# Patient Record
Sex: Female | Born: 1960 | Race: White | Hispanic: No | Smoking: Never smoker
Health system: Southern US, Community
[De-identification: ages and names within clinical notes are randomized; demographics above are authoritative.]

## PROBLEM LIST (undated history)

## (undated) DIAGNOSIS — J45909 Unspecified asthma, uncomplicated: Secondary | ICD-10-CM

## (undated) DIAGNOSIS — M5430 Sciatica, unspecified side: Secondary | ICD-10-CM

## (undated) DIAGNOSIS — I10 Essential (primary) hypertension: Secondary | ICD-10-CM

## (undated) DIAGNOSIS — T8859XA Other complications of anesthesia, initial encounter: Secondary | ICD-10-CM

## (undated) DIAGNOSIS — R202 Paresthesia of skin: Secondary | ICD-10-CM

## (undated) DIAGNOSIS — G5600 Carpal tunnel syndrome, unspecified upper limb: Secondary | ICD-10-CM

## (undated) DIAGNOSIS — M779 Enthesopathy, unspecified: Secondary | ICD-10-CM

## (undated) DIAGNOSIS — K589 Irritable bowel syndrome without diarrhea: Secondary | ICD-10-CM

## (undated) DIAGNOSIS — E119 Type 2 diabetes mellitus without complications: Secondary | ICD-10-CM

## (undated) HISTORY — DX: Enthesopathy, unspecified: M77.9

## (undated) HISTORY — DX: Type 2 diabetes mellitus without complications: E11.9

## (undated) HISTORY — PX: CARPAL TUNNEL RELEASE: SHX101

## (undated) HISTORY — DX: Unspecified asthma, uncomplicated: J45.909

## (undated) HISTORY — DX: Paresthesia of skin: R20.2

## (undated) HISTORY — DX: Irritable bowel syndrome, unspecified: K58.9

## (undated) HISTORY — DX: Sciatica, unspecified side: M54.30

## (undated) HISTORY — PX: ABDOMINAL HYSTERECTOMY: SHX81

---

## 2006-04-16 ENCOUNTER — Ambulatory Visit (HOSPITAL_COMMUNITY): Admission: RE | Admit: 2006-04-16 | Discharge: 2006-04-16 | Payer: Self-pay | Admitting: Family Medicine

## 2009-05-15 ENCOUNTER — Ambulatory Visit (HOSPITAL_COMMUNITY): Admission: RE | Admit: 2009-05-15 | Discharge: 2009-05-15 | Payer: Self-pay | Admitting: Internal Medicine

## 2009-05-15 ENCOUNTER — Encounter (INDEPENDENT_AMBULATORY_CARE_PROVIDER_SITE_OTHER): Payer: Self-pay | Admitting: Internal Medicine

## 2009-09-17 ENCOUNTER — Ambulatory Visit (HOSPITAL_COMMUNITY): Admission: RE | Admit: 2009-09-17 | Discharge: 2009-09-17 | Payer: Self-pay | Admitting: Family Medicine

## 2010-05-09 ENCOUNTER — Emergency Department (HOSPITAL_COMMUNITY)
Admission: EM | Admit: 2010-05-09 | Discharge: 2010-05-09 | Disposition: A | Discharge status: Home / Self Care | Payer: Medicaid Other | Attending: Emergency Medicine | Admitting: Emergency Medicine

## 2010-05-09 ENCOUNTER — Emergency Department (HOSPITAL_COMMUNITY): Payer: Medicaid Other

## 2010-05-09 DIAGNOSIS — E119 Type 2 diabetes mellitus without complications: Secondary | ICD-10-CM | POA: Insufficient documentation

## 2010-05-09 DIAGNOSIS — S8990XA Unspecified injury of unspecified lower leg, initial encounter: Secondary | ICD-10-CM | POA: Insufficient documentation

## 2010-05-09 DIAGNOSIS — M25569 Pain in unspecified knee: Secondary | ICD-10-CM | POA: Insufficient documentation

## 2010-05-09 DIAGNOSIS — Y929 Unspecified place or not applicable: Secondary | ICD-10-CM | POA: Insufficient documentation

## 2010-05-09 DIAGNOSIS — X500XXA Overexertion from strenuous movement or load, initial encounter: Secondary | ICD-10-CM | POA: Insufficient documentation

## 2010-06-30 ENCOUNTER — Telehealth: Payer: Self-pay

## 2010-06-30 DIAGNOSIS — Z139 Encounter for screening, unspecified: Secondary | ICD-10-CM

## 2010-06-30 NOTE — Telephone Encounter (Signed)
Gastroenterology Pre-Procedure Form  Request Date: 07/18/2010  Requesting Physician: Ocie Bob Diego     PATIENT INFORMATION:  Megan Salazar is a 50 y.o., female (DOB=12-Apr-1960).  PROCEDURE: Procedure(s) requested: colonoscopy Procedure Reason: screening for colon cancer  PATIENT REVIEW QUESTIONS: The patient reports the following:   1. Diabetes Melitis: yes  (Diet controlled) 2. Joint replacements in the past 12 months: no 3. Major health problems in the past 3 months: no 4. Has an artificial valve or MVP:no 5. Has been advised in past to take antibiotics in advance of a procedure like teeth cleaning: no}    MEDICATIONS & ALLERGIES:    Patient reports the following regarding taking any blood thinners:   Plavix? no Aspirin?no Coumadin?  no  Patient confirms/reports the following medications:  Current Outpatient Prescriptions  Medication Sig Dispense Refill  . fluticasone (FLOVENT HFA) 110 MCG/ACT inhaler Inhale 1 puff into the lungs 2 (two) times daily. As needed       . montelukast (SINGULAIR) 10 MG tablet Take 10 mg by mouth at bedtime. As needed         Patient confirms/reports the following allergies:  Allergies  Allergen Reactions  . Codeine     Liquid codeine  . Sulfa Drugs Cross Reactors     Patient is appropriate to schedule for requested procedure(s): yes  AUTHORIZATION INFORMATION Primary Insurance: Medicaid  ID # [id number] Q  Q: Group #:   Pre-Cert / Auth required:  Secondary Insurance:   ID #: ,  Group #:  Pre-Cert / Auth required: n/a Pre-Cert / Auth #:  Orders Placed This Encounter  Procedures  . Endoscopy, colon, diagnostic    Standing Status: Future     Number of Occurrences:      Standing Expiration Date: 06/30/2011    Order Specific Question:  Pre-op diagnosis    Answer:  screening    Order Specific Question:  Pre-op visit required?    Answer:  No [0]    SCHEDULE INFORMATION: Procedure has been scheduled as follows:  Date:  07/18/2010  Time: 11:15 AM  Location: [facility] Short Stay   This Gastroenterology Pre-Precedure Form is being routed to the following provider(s) for review: Jonette Eva, MD

## 2010-06-30 NOTE — Telephone Encounter (Signed)
HALF-LYTELY

## 2010-07-18 ENCOUNTER — Encounter: Payer: Medicaid Other | Admitting: Gastroenterology

## 2010-07-18 ENCOUNTER — Other Ambulatory Visit: Payer: Self-pay | Admitting: Gastroenterology

## 2010-07-18 ENCOUNTER — Ambulatory Visit (HOSPITAL_COMMUNITY)
Admission: RE | Admit: 2010-07-18 | Discharge: 2010-07-18 | Disposition: A | Discharge status: Home / Self Care | Payer: Medicaid Other | Source: Ambulatory Visit | Attending: Gastroenterology | Admitting: Gastroenterology

## 2010-07-18 DIAGNOSIS — Z1211 Encounter for screening for malignant neoplasm of colon: Secondary | ICD-10-CM | POA: Insufficient documentation

## 2010-07-18 DIAGNOSIS — K5730 Diverticulosis of large intestine without perforation or abscess without bleeding: Secondary | ICD-10-CM

## 2010-07-18 DIAGNOSIS — D126 Benign neoplasm of colon, unspecified: Secondary | ICD-10-CM

## 2010-07-18 HISTORY — PX: COLONOSCOPY: SHX174

## 2010-07-21 NOTE — Progress Notes (Signed)
Cc to PCP 

## 2010-07-22 NOTE — Progress Notes (Signed)
Reminder in epic to have TCS in 10 yrs

## 2010-08-19 NOTE — Op Note (Signed)
  NAME:  Megan Salazar, Megan Salazar NO.:  [id number]  MEDICAL RECORD NO.:  [id number]           PATIENT TYPE:  O  LOCATION:  DAYP                          FACILITY:  [facility]  PHYSICIAN:  Jonette Eva, M.D.     DATE OF BIRTH:  June 08, 1960  DATE OF PROCEDURE:  07/18/2010 DATE OF DISCHARGE:                              OPERATIVE REPORT   REFERRING PHYSICIAN:  Melvyn Novas, MD  PROCEDURE:  Colonoscopy with snare cautery with cold forceps polypectomy.  INDICATION FOR EXAM:  Megan Salazar is a 50 year old female who presents for average risk colon cancer screening.  FINDINGS: 1. A 3-mm sessile ascending colon polyp removed via cold forceps. 2. Scattered diverticula seen in the beginning in the mid transverse     colon and extending to the sigmoid colon.  Otherwise no masses,     inflammatory changes or AVMs seen. 3. Normal retroflexed view of the rectum.  RECOMMENDATIONS: 1. Screening colonoscopy in 10 years.  We will call her with results     of her biopsies. 2. She should follow a high-fiber diet.  She was given handout on high-     fiber diet, polyps, and diverticulosis.  MEDICATIONS:  None.  PROCEDURE TECHNIQUE:  Physical exam was performed.  Informed consent was obtained from the patient explaining benefits, risks, and alternatives to procedure.  The patient was connected to the monitor and placed in the left lateral position.  Continuous oxygen was provided by nasal cannula and IV fluids were administered through indwelling cannula. After rectal exam, the patient's rectum was intubated and the scope was advanced under direct visualization to the cecum.  The scope was removed slowly by carefully examining the color, texture, anatomy, and integrity of the mucosa on the way out.  The patient was recovered in endoscopy and discharged home in satisfactory condition.  PATH: SIMPLE ADENOMA-TCS IN 10 YEARS, HIGH FIBER DIET  Jonette Eva, M.D.    SF/MEDQ  D:   07/18/2010  T:  07/19/2010  Job:  [id number]  Electronically Signed by Jonette Eva M.D. on 08/19/2010 02:24:51 PM

## 2010-12-01 ENCOUNTER — Other Ambulatory Visit (HOSPITAL_COMMUNITY): Payer: Self-pay | Admitting: Family Medicine

## 2010-12-01 DIAGNOSIS — Z139 Encounter for screening, unspecified: Secondary | ICD-10-CM

## 2010-12-05 ENCOUNTER — Ambulatory Visit (HOSPITAL_COMMUNITY)
Admission: RE | Admit: 2010-12-05 | Discharge: 2010-12-05 | Disposition: A | Discharge status: Home / Self Care | Payer: Medicaid Other | Source: Ambulatory Visit | Attending: Family Medicine | Admitting: Family Medicine

## 2010-12-05 DIAGNOSIS — Z139 Encounter for screening, unspecified: Secondary | ICD-10-CM

## 2010-12-05 DIAGNOSIS — Z1231 Encounter for screening mammogram for malignant neoplasm of breast: Secondary | ICD-10-CM | POA: Insufficient documentation

## 2011-08-03 ENCOUNTER — Other Ambulatory Visit (HOSPITAL_COMMUNITY): Payer: Self-pay | Admitting: Family Medicine

## 2011-08-03 DIAGNOSIS — M545 Low back pain: Secondary | ICD-10-CM

## 2011-08-05 ENCOUNTER — Ambulatory Visit (HOSPITAL_COMMUNITY)
Admission: RE | Admit: 2011-08-05 | Discharge: 2011-08-05 | Disposition: A | Discharge status: Home / Self Care | Payer: Medicaid Other | Source: Ambulatory Visit | Attending: Family Medicine | Admitting: Family Medicine

## 2011-08-05 DIAGNOSIS — M545 Low back pain: Secondary | ICD-10-CM

## 2011-08-18 ENCOUNTER — Other Ambulatory Visit: Payer: Self-pay | Admitting: Family Medicine

## 2011-08-18 ENCOUNTER — Ambulatory Visit
Admission: RE | Admit: 2011-08-18 | Discharge: 2011-08-18 | Disposition: A | Discharge status: Home / Self Care | Payer: Medicaid Other | Source: Ambulatory Visit | Attending: Family Medicine | Admitting: Family Medicine

## 2011-08-18 ENCOUNTER — Ambulatory Visit: Admission: RE | Admit: 2011-08-18 | Payer: Medicaid Other | Source: Ambulatory Visit

## 2011-08-18 DIAGNOSIS — M545 Low back pain: Secondary | ICD-10-CM

## 2011-12-30 ENCOUNTER — Encounter: Payer: Self-pay | Admitting: Gastroenterology

## 2011-12-31 ENCOUNTER — Ambulatory Visit (INDEPENDENT_AMBULATORY_CARE_PROVIDER_SITE_OTHER): Payer: Medicaid Other | Admitting: Urgent Care

## 2011-12-31 ENCOUNTER — Encounter: Payer: Self-pay | Admitting: Urgent Care

## 2011-12-31 VITALS — BP 132/68 | HR 84 | Temp 98.4°F | Ht 64.0 in | Wt 270.4 lb

## 2011-12-31 DIAGNOSIS — R197 Diarrhea, unspecified: Secondary | ICD-10-CM

## 2011-12-31 DIAGNOSIS — K529 Noninfective gastroenteritis and colitis, unspecified: Secondary | ICD-10-CM

## 2011-12-31 MED ORDER — ALIGN 4 MG PO CAPS: 4.0000 mg | ORAL_CAPSULE | Freq: Every day | ORAL | Status: DC

## 2011-12-31 MED ORDER — HYDROCORTISONE ACETATE 25 MG RE SUPP: 25.0000 mg | Freq: Two times a day (BID) | RECTAL | Status: DC

## 2011-12-31 MED ORDER — DICYCLOMINE HCL 10 MG PO CAPS: 10.0000 mg | ORAL_CAPSULE | Freq: Three times a day (TID) | ORAL | Status: DC

## 2011-12-31 NOTE — Patient Instructions (Addendum)
Please have your PCP fax lab results to [place] Bentyl 10mg  before each meal & bedtime (four times per day) as needed for diarrhea Align daily Anusol suppositories for hemorrhoids Please return stools & get labs drawn as soon as possible Speak with your doctor about your concerns about lisinopril We will call you with lab results 5-7 days after they are turned in

## 2011-12-31 NOTE — Progress Notes (Signed)
Referring Provider: Isabella Stalling, MD Primary Care Physician:  Isabella Stalling, MD Primary Gastroenterologist:  Dr. Jonette Eva  Chief Complaint  Patient presents with  . Diarrhea    HPI:  Megan Salazar is a 51 y.o. female here as a referral from Dr. Janna Arch for chronic diarrhea.  She gives hx of IBS & "colitis" years ago while in the [employer].  She tells me she was working & under a significant amount of stress.  She says she ways diagnosed with xrays.  She did not have a colonoscopy at that time.  Never treated on any chronic medications for colitis.  She also believes this was the reason she was discharged from the [employer] as she always had to be near a bathroom.  Symptoms have worsened 1 yr ago after she started glimiperide & Lisinopril.  She is having TNTC loose stools daily.  She stopped tumeric as she felt it may have contributed, but did not see a difference.  She is having stools every hour, awakened out of sound sleep at times.  During the day she can go 6-7 hrs straight with bowel movements.  C/o abdominal bloating, gas, &  incontinence.   Hx hemorrhoids & rectal bleeding in small amts. No mucus, no vomiting.  No nausea,  + chills.   No fever.  Z-pack for sinusitis 8 mo ago.  Gives blood several times per yr (last Oct 4) & was told her blood count was normal.  Last Hgb a1c 7.4.  No recent travel, ill contacts, or new pets.  She has also noticed hemorrhoids have been bothering her, scant hematochezia with wiping.     Past Medical History  Diagnosis Date  . Diabetes   . IBS (irritable bowel syndrome)     chronic diarrhea  . Sciatica   . Asthma   . Bone spur   . Paresthesias     left side    Past Surgical History  Procedure Date  . Colonoscopy 07/18/2010    Fields-SIMPLE ADENOMA (Next colonoscopy  06/2020)  . Carpal tunnel release     bilat, [place]  . Cesarean section   . Abdominal hysterectomy     partial    Current Outpatient Prescriptions  Medication Sig  Dispense Refill  . fluticasone (FLOVENT HFA) 110 MCG/ACT inhaler Inhale 1 puff into the lungs 2 (two) times daily. As needed       . glimepiride (AMARYL) 2 MG tablet Take 2 mg by mouth daily before breakfast.      . lisinopril (PRINIVIL,ZESTRIL) 5 MG tablet Take 5 mg by mouth daily.      . montelukast (SINGULAIR) 10 MG tablet Take 10 mg by mouth at bedtime. As needed       . dicyclomine (BENTYL) 10 MG capsule Take 1 capsule (10 mg total) by mouth 4 (four) times daily -  before meals and at bedtime.  90 capsule  2  . hydrocortisone (ANUSOL-HC) 25 MG suppository Place 1 suppository (25 mg total) rectally 2 (two) times daily.  20 suppository  0  . Probiotic Product (ALIGN) 4 MG CAPS Take 4 mg by mouth daily.  31 capsule  5    Allergies as of 12/31/2011 - Review Complete 12/31/2011  Allergen Reaction Noted  . Equal (aspartame)  07/03/2010  . Codeine  06/30/2010  . Sulfa drugs cross reactors  06/30/2010    Family History:There is no known family history of colorectal carcinoma , liver disease, or inflammatory bowel disease.  Problem Relation Age  of Onset  . Brain cancer Brother   . Diverticulitis Sister   . Coronary artery disease Father   . Diabetes Father     History   Social History  . Marital Status: Divorced    Spouse Name: N/A    Number of Children: 1  . Years of Education: N/A   Occupational History  . Disabled, previous [employer] [phone]    Social History Main Topics  . Smoking status: Never Smoker   . Smokeless tobacco: Not on file  . Alcohol Use: No  . Drug Use: No  . Sexually Active: Not on file   Review of Systems: Gen: + fatigue, malaise, problems sleeping CV: Denies chest pain, palpitations Resp: Dyspnea on exertion. Denies dyspnea at rest, cough, sputum, wheezing, coughing up blood, and pleurisy. GI: Denies vomiting blood, jaundice, and fecal incontinence.   Denies dysphagia or odynophagia. GU : Denies urinary burning, blood in urine, urinary frequency, urinary  hesitancy, nocturnal urination, and urinary incontinence. MS: chronic joint pain & myalgias.  Derm: Denies rash, itching, dry skin, hives, moles, warts, or unhealing ulcers.  Psych: Denies depression, anxiety, memory loss, suicidal ideation, hallucinations, paranoia, and confusion. Heme: Denies bruising, bleeding, and enlarged lymph nodes. Neuro:  Denies any headaches, dizziness, paresthesias. Endo:  Wants to control DM with diet.  Really does not want meds.  Physical Exam: BP 132/68  Pulse 84  Temp 98.4 F (36.9 C) (Temporal)  Ht 5\' 4"  (1.626 m)  Wt 270 lb 6.4 oz (122.653 kg)  BMI 46.41 kg/m2 No LMP recorded. Patient is not currently having periods (Reason: Other). General:   Alert,  Well-developed, well-nourished, pleasant and cooperative in NAD Head:  Normocephalic and atraumatic. Eyes:  Sclera clear, no icterus.   Conjunctiva pink. Ears:  Normal auditory acuity. Nose:  No deformity, discharge, or lesions. Mouth:  No deformity or lesions,oropharynx pink & moist. Neck:  Supple; no masses or thyromegaly. Lungs:  Clear throughout to auscultation.   No wheezes, crackles, or rhonchi. No acute distress. Heart:  Regular rate and rhythm; no murmurs, clicks, rubs,  or gallops. Abdomen:  Protuberant.  Normal bowel sounds.  No bruits.  Soft, non-tender and non-distended without masses, hepatosplenomegaly or hernias noted.  No guarding or rebound tenderness.  Exam limited given patient's body habitus. Rectal:  Deferred. Msk:  Symmetrical without gross deformities. Pulses:  Normal pulses noted. Extremities:  No clubbing or edema. Neurologic:  Alert and oriented x4;  grossly normal neurologically. Skin:  Intact without significant lesions or rashes. Lymph Nodes:  No significant cervical adenopathy. Psych:  Alert and cooperative. Normal mood and affect.

## 2012-01-01 NOTE — Assessment & Plan Note (Addendum)
Megan Salazar is a pleasant 51 y.o. female with chronic profuse diarrhea.  Screening colonoscopy last year was normal without any endoscopic evidence of colitis.  She gives hx of "colitis" years ago  & IBS.  Symptoms have worsened over past year.  Differentials include severe IBS, microscopic colitis, diabetic enteropathy, celiac disease or small bowel bacterial overgrowth, & less likely inflammatory bowel disease.      Full set of stool studies, TSH, CBC, TTG IgA Bentyl 10mg  before each meal & bedtime (four times per day) as needed for diarrhea Align daily Anusol suppositories BID for hemorrhoids for 10 days  Speak with your doctor about your concerns about lisinopril

## 2012-01-02 LAB — FECAL LACTOFERRIN, QUANT: Lactoferrin: NEGATIVE

## 2012-01-04 LAB — GIARDIA/CRYPTOSPORIDIUM (EIA): Cryptosporidium Screen (EIA): NEGATIVE

## 2012-01-04 LAB — CLOSTRIDIUM DIFFICILE BY PCR: Toxigenic C. Difficile by PCR: NOT DETECTED

## 2012-01-04 NOTE — Progress Notes (Signed)
Faxed to PCP

## 2012-01-07 LAB — STOOL CULTURE

## 2012-01-07 NOTE — Progress Notes (Signed)
Quick Note:  Did pt get labs drawn?  ______

## 2012-01-11 LAB — CBC WITH DIFFERENTIAL/PLATELET
Basophils Absolute: 0 10*3/uL (ref 0.0–0.1)
Basophils Relative: 0 % (ref 0–1)
Eosinophils Absolute: 0.3 10*3/uL (ref 0.0–0.7)
Eosinophils Relative: 4 % (ref 0–5)
HCT: 40 % (ref 36.0–46.0)
MCHC: 32.8 g/dL (ref 30.0–36.0)
MCV: 90.9 fL (ref 78.0–100.0)
Monocytes Relative: 8 % (ref 3–12)
Neutro Abs: 3.9 10*3/uL (ref 1.7–7.7)
Platelets: 294 10*3/uL (ref 150–400)

## 2012-01-11 NOTE — Progress Notes (Signed)
Quick Note:  Called pt and she said that she is going to go this AM when it warms up. ______

## 2012-01-12 NOTE — Progress Notes (Signed)
Quick Note:  Please let pt know blood ct, thyroid, lab for celiac disease and stools all normal. Suspect Megan Salazar has irritable bowel syndrome. How is Megan Salazar doing on Align, Bentyl, Anusol? [id number] M, MD   ______

## 2012-01-12 NOTE — Progress Notes (Signed)
Quick Note:  Ok, please set up FU OV re: IBS w/ Dr Darrick Penna in 3 months Thanks ______

## 2012-01-12 NOTE — Progress Notes (Signed)
Quick Note:  I called and informed pt. She is doing well. Said she has a BM within 2-3 hours after her meals. Normal stool. She is taking the align daily. Taking Bentyl once a week and it has gotten her on track. She could not afford the Anusol, she used Prep H instead and is doing well. She will call if anything changes. ______

## 2012-02-03 NOTE — Progress Notes (Signed)
REVIEWED.  

## 2012-02-04 ENCOUNTER — Other Ambulatory Visit (HOSPITAL_COMMUNITY): Payer: Self-pay | Admitting: Family Medicine

## 2012-02-04 DIAGNOSIS — Z139 Encounter for screening, unspecified: Secondary | ICD-10-CM

## 2012-02-08 ENCOUNTER — Ambulatory Visit (HOSPITAL_COMMUNITY)
Admission: RE | Admit: 2012-02-08 | Discharge: 2012-02-08 | Disposition: A | Discharge status: Home / Self Care | Payer: Medicaid Other | Source: Ambulatory Visit | Attending: Family Medicine | Admitting: Family Medicine

## 2012-02-08 DIAGNOSIS — Z1231 Encounter for screening mammogram for malignant neoplasm of breast: Secondary | ICD-10-CM | POA: Insufficient documentation

## 2012-02-08 DIAGNOSIS — Z139 Encounter for screening, unspecified: Secondary | ICD-10-CM

## 2012-03-09 ENCOUNTER — Encounter: Payer: Self-pay | Admitting: *Deleted

## 2012-11-29 ENCOUNTER — Emergency Department (HOSPITAL_COMMUNITY): Payer: Medicaid Other

## 2012-11-29 ENCOUNTER — Encounter (HOSPITAL_COMMUNITY): Payer: Self-pay | Admitting: Emergency Medicine

## 2012-11-29 ENCOUNTER — Emergency Department (HOSPITAL_COMMUNITY)
Admission: EM | Admit: 2012-11-29 | Discharge: 2012-11-29 | Disposition: A | Discharge status: Home / Self Care | Payer: Medicaid Other | Attending: Emergency Medicine | Admitting: Emergency Medicine

## 2012-11-29 DIAGNOSIS — W108XXA Fall (on) (from) other stairs and steps, initial encounter: Secondary | ICD-10-CM | POA: Insufficient documentation

## 2012-11-29 DIAGNOSIS — Y9389 Activity, other specified: Secondary | ICD-10-CM | POA: Insufficient documentation

## 2012-11-29 DIAGNOSIS — S8392XA Sprain of unspecified site of left knee, initial encounter: Secondary | ICD-10-CM

## 2012-11-29 DIAGNOSIS — K589 Irritable bowel syndrome without diarrhea: Secondary | ICD-10-CM | POA: Insufficient documentation

## 2012-11-29 DIAGNOSIS — Z79899 Other long term (current) drug therapy: Secondary | ICD-10-CM | POA: Insufficient documentation

## 2012-11-29 DIAGNOSIS — IMO0002 Reserved for concepts with insufficient information to code with codable children: Secondary | ICD-10-CM | POA: Insufficient documentation

## 2012-11-29 DIAGNOSIS — E119 Type 2 diabetes mellitus without complications: Secondary | ICD-10-CM | POA: Insufficient documentation

## 2012-11-29 DIAGNOSIS — Y929 Unspecified place or not applicable: Secondary | ICD-10-CM | POA: Insufficient documentation

## 2012-11-29 DIAGNOSIS — J45909 Unspecified asthma, uncomplicated: Secondary | ICD-10-CM | POA: Insufficient documentation

## 2012-11-29 MED ORDER — IBUPROFEN 800 MG PO TABS: 800.0000 mg | ORAL_TABLET | Freq: Three times a day (TID) | ORAL | Status: DC

## 2012-11-29 NOTE — ED Notes (Signed)
Patient transported to X-ray 

## 2012-11-29 NOTE — ED Notes (Signed)
Per [facility] pt was walking off front porch and slipped with her knee bending underneath her. She is experiencing pain and tingling from left knee down to left ankle.

## 2012-11-29 NOTE — ED Provider Notes (Signed)
CSN: [id number]     Arrival date & time 11/29/12  0710 History  This chart was scribed for Megan Lennert, MD by Caryn Bee, ED Scribe. This patient was seen in room APA03/AP[facility]03 and the patient's care was started 7:27 AM.    Chief Complaint  Patient presents with  . Knee Pain   Patient is a 52 y.o. female presenting with knee pain. The history is provided by the patient. No language interpreter was used.  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   Radiates to:  Does not radiate   Severity:  Moderate   Onset quality:  Sudden   Timing:  Constant   Progression:  Unchanged Chronicity:  New Dislocation: no   Foreign body present:  No foreign bodies Prior injury to area:  Yes (Left knee fracture, 2006) Relieved by: flexion. Worsened by:  Extension and bearing weight Ineffective treatments:  None tried Associated symptoms: swelling   Associated symptoms: no back pain and no fatigue    HPI Comments: Megan Salazar is a 52 y.o. female brought in by [facility] to the Emergency Department complaining of constant, moderate left knee pain that onset suddenly this morning when she slipped on a wet surface, and fell off of her porch this morning. Pt states that she felt her knee bend into an uncomfortable position after the fall. Pt describes the pain as "soreness" and "tightness". Pt reports that she was able to walk after the fall, but with exacerbation of her pain. She states that extending and moving the knee worsens the pain. 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 She also reports a left knee fracture in 2006. She denies head injury, LOC or any other symptoms.  PCP- Dr. Oval Linsey  Past Medical  History  Diagnosis Date  . Diabetes   . IBS (irritable bowel syndrome)     chronic diarrhea  . Sciatica   . Asthma   . Bone spur   . Paresthesias     left side   Past Surgical History  Procedure Laterality Date  . Colonoscopy  07/18/2010    Fields-SIMPLE ADENOMA (Next colonoscopy  06/2020)  . Carpal tunnel release      bilat, [place]  . Cesarean section    . Abdominal hysterectomy      partial   Family History  Problem Relation Age of Onset  . Brain cancer Brother   . Diverticulitis Sister   . Coronary artery disease Father   . Diabetes Father    History  Substance Use Topics  . Smoking status: Never Smoker   . Smokeless tobacco: Not on file  . Alcohol Use: No   OB History   Grav Para Term Preterm Abortions TAB SAB Ect Mult Living                 Review of Systems  Constitutional: Negative for appetite change and fatigue.  HENT: Negative for congestion, sinus pressure and ear discharge.   Eyes: Negative for discharge.  Respiratory: Negative for cough.   Cardiovascular: Negative for chest pain.  Gastrointestinal: Negative for abdominal pain and diarrhea.  Genitourinary: Negative for frequency and  hematuria.  Musculoskeletal: Positive for arthralgias (Left knee) and gait problem. Negative for back pain.  Skin: Negative for rash.  Neurological: Negative for seizures, syncope and headaches.  Psychiatric/Behavioral: Negative for hallucinations.  All other systems reviewed and are negative.    Allergies  Equal; Codeine; and Sulfa drugs cross reactors  Home Medications   Current Outpatient Rx  Name  Route  Sig  Dispense  Refill  . dicyclomine (BENTYL) 10 MG capsule   Oral   Take 1 capsule (10 mg total) by mouth 4 (four) times daily -  before meals and at bedtime.   90 capsule   2   . fluticasone (FLOVENT HFA) 110 MCG/ACT inhaler   Inhalation   Inhale 1 puff into the lungs 2 (two) times daily. As needed          . glimepiride (AMARYL) 2 MG tablet    Oral   Take 2 mg by mouth daily before breakfast.         . hydrocortisone (ANUSOL-HC) 25 MG suppository   Rectal   Place 1 suppository (25 mg total) rectally 2 (two) times daily.   20 suppository   0   . lisinopril (PRINIVIL,ZESTRIL) 5 MG tablet   Oral   Take 5 mg by mouth daily.         . montelukast (SINGULAIR) 10 MG tablet   Oral   Take 10 mg by mouth at bedtime. As needed          . Probiotic Product (ALIGN) 4 MG CAPS   Oral   Take 4 mg by mouth daily.   31 capsule   5    Triage Vitals: BP 130/72  Pulse 66  Temp(Src) 97.8 F (36.6 C) (Oral)  Resp 18  Ht 5\' 4"  (1.626 m)  Wt 270 lb (122.471 kg)  BMI 46.32 kg/m2  SpO2 95%  Physical Exam  Nursing note and vitals reviewed. Constitutional: She is oriented to person, place, and time. She appears well-developed and well-nourished.  HENT:  Head: Normocephalic.  Eyes: Conjunctivae are normal.  Neck: No tracheal deviation present.  Cardiovascular:  No murmur heard. Musculoskeletal: Normal range of motion. She exhibits edema and tenderness.  Pain with extension of the left knee. Minimal tenderness of medial left knee. Mild swelling to left knee.  Neurological: She is oriented to person, place, and time.  Skin: Skin is warm.  Psychiatric: She has a normal mood and affect.    ED Course  Procedures (including critical care time) DIAGNOSTIC STUDIES: Oxygen Saturation is 95% on room air, adequate by my interpretation.    COORDINATION OF CARE: 7:34 AM-Discussed clinical suspicion of strained ligaments and that a fracture is not highly suspected. Will order x-ray of the left knee to rule out fracture or dislocation. Discussed treatment plan with pt at bedside and pt agreed to plan.   8:52 AM- Recheck: Discussed radiology findings with patient, indicating no fracture to the left leg. Will order a walker and knee sleeve. Offered patient prescription for ibuprofen, but pt declined.  Labs Review Labs Reviewed - No  data to display Imaging Review Dg Knee Complete 4 Views Left  11/29/2012   *RADIOLOGY REPORT*  Clinical Data: Left knee pain  LEFT KNEE - COMPLETE 4+ VIEW  Comparison: None.  Findings: Mild osteophytic changes are noted.  No acute fracture or dislocation is seen.  No joint effusion is noted.  IMPRESSION: Mild degenerative changes without acute abnormality.   Original Report Authenticated By: Alcide Clever, M.D.  MDM  No diagnosis found.    The chart was scribed for me under my direct supervision.  I personally performed the history, physical, and medical decision making and all procedures in the evaluation of this patient.Megan Lennert, MD 11/29/12 [phone]

## 2012-11-29 NOTE — ED Notes (Signed)
MD at bedside. 

## 2013-01-17 ENCOUNTER — Other Ambulatory Visit (HOSPITAL_COMMUNITY): Payer: Self-pay | Admitting: Family Medicine

## 2013-01-17 DIAGNOSIS — Z139 Encounter for screening, unspecified: Secondary | ICD-10-CM

## 2013-02-09 ENCOUNTER — Ambulatory Visit (HOSPITAL_COMMUNITY)
Admission: RE | Admit: 2013-02-09 | Discharge: 2013-02-09 | Disposition: A | Discharge status: Home / Self Care | Payer: Medicaid Other | Source: Ambulatory Visit | Attending: Family Medicine | Admitting: Family Medicine

## 2013-02-09 DIAGNOSIS — Z139 Encounter for screening, unspecified: Secondary | ICD-10-CM

## 2013-02-09 DIAGNOSIS — Z1231 Encounter for screening mammogram for malignant neoplasm of breast: Secondary | ICD-10-CM | POA: Insufficient documentation

## 2014-01-11 ENCOUNTER — Other Ambulatory Visit (HOSPITAL_COMMUNITY): Payer: Self-pay | Admitting: Family Medicine

## 2014-01-11 ENCOUNTER — Other Ambulatory Visit: Payer: Self-pay | Admitting: Neurology

## 2014-01-11 DIAGNOSIS — Z1231 Encounter for screening mammogram for malignant neoplasm of breast: Secondary | ICD-10-CM

## 2014-02-12 ENCOUNTER — Ambulatory Visit (HOSPITAL_COMMUNITY)
Admission: RE | Admit: 2014-02-12 | Discharge: 2014-02-12 | Disposition: A | Discharge status: Home / Self Care | Payer: Medicaid Other | Source: Ambulatory Visit | Attending: Family Medicine | Admitting: Family Medicine

## 2014-02-12 DIAGNOSIS — Z1231 Encounter for screening mammogram for malignant neoplasm of breast: Secondary | ICD-10-CM | POA: Diagnosis present

## 2015-01-15 ENCOUNTER — Other Ambulatory Visit (HOSPITAL_COMMUNITY): Payer: Self-pay | Admitting: Internal Medicine

## 2015-01-15 DIAGNOSIS — Z1231 Encounter for screening mammogram for malignant neoplasm of breast: Secondary | ICD-10-CM

## 2015-02-18 ENCOUNTER — Ambulatory Visit (HOSPITAL_COMMUNITY): Payer: Medicaid Other

## 2015-02-21 ENCOUNTER — Ambulatory Visit (HOSPITAL_COMMUNITY): Payer: Medicaid Other

## 2015-02-21 ENCOUNTER — Ambulatory Visit (HOSPITAL_COMMUNITY)
Admission: RE | Admit: 2015-02-21 | Discharge: 2015-02-21 | Disposition: A | Discharge status: Home / Self Care | Payer: Medicaid Other | Source: Ambulatory Visit | Attending: Internal Medicine | Admitting: Internal Medicine

## 2015-02-21 DIAGNOSIS — Z1231 Encounter for screening mammogram for malignant neoplasm of breast: Secondary | ICD-10-CM | POA: Diagnosis not present

## 2015-07-18 ENCOUNTER — Ambulatory Visit (INDEPENDENT_AMBULATORY_CARE_PROVIDER_SITE_OTHER): Payer: Medicaid Other | Admitting: Otolaryngology

## 2015-07-18 DIAGNOSIS — H903 Sensorineural hearing loss, bilateral: Secondary | ICD-10-CM | POA: Diagnosis not present

## 2015-07-18 DIAGNOSIS — H9313 Tinnitus, bilateral: Secondary | ICD-10-CM | POA: Diagnosis not present

## 2015-07-18 DIAGNOSIS — H6122 Impacted cerumen, left ear: Secondary | ICD-10-CM

## 2015-07-31 ENCOUNTER — Encounter (HOSPITAL_COMMUNITY): Payer: Self-pay

## 2015-07-31 ENCOUNTER — Emergency Department (HOSPITAL_COMMUNITY)
Admission: EM | Admit: 2015-07-31 | Discharge: 2015-07-31 | Disposition: A | Discharge status: Home / Self Care | Payer: Medicaid Other | Attending: Emergency Medicine | Admitting: Emergency Medicine

## 2015-07-31 ENCOUNTER — Emergency Department (HOSPITAL_COMMUNITY): Payer: Medicaid Other

## 2015-07-31 DIAGNOSIS — Y939 Activity, unspecified: Secondary | ICD-10-CM | POA: Insufficient documentation

## 2015-07-31 DIAGNOSIS — S76311A Strain of muscle, fascia and tendon of the posterior muscle group at thigh level, right thigh, initial encounter: Secondary | ICD-10-CM | POA: Diagnosis not present

## 2015-07-31 DIAGNOSIS — E119 Type 2 diabetes mellitus without complications: Secondary | ICD-10-CM | POA: Diagnosis not present

## 2015-07-31 DIAGNOSIS — Y999 Unspecified external cause status: Secondary | ICD-10-CM | POA: Insufficient documentation

## 2015-07-31 DIAGNOSIS — M79604 Pain in right leg: Secondary | ICD-10-CM | POA: Diagnosis present

## 2015-07-31 DIAGNOSIS — W230XXA Caught, crushed, jammed, or pinched between moving objects, initial encounter: Secondary | ICD-10-CM | POA: Insufficient documentation

## 2015-07-31 DIAGNOSIS — J45909 Unspecified asthma, uncomplicated: Secondary | ICD-10-CM | POA: Diagnosis not present

## 2015-07-31 DIAGNOSIS — Y92811 Bus as the place of occurrence of the external cause: Secondary | ICD-10-CM | POA: Diagnosis not present
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
## 2015-07-31 NOTE — Discharge Instructions (Signed)
Use tylenol and naproxen as needed for pain.  If you were given medicines take as directed.  If you are on coumadin or contraceptives realize their levels and effectiveness is altered by many different medicines.  If you have any reaction (rash, tongues swelling, other) to the medicines stop taking and see a physician.    If your blood pressure was elevated in the ER make sure you follow up for management with a primary doctor or return for chest pain, shortness of breath or stroke symptoms.  Please follow up as directed and return to the ER or see a physician for new or worsening symptoms.  Thank you. Filed Vitals:   07/31/15 0921  BP: 154/87  Pulse: 71  Temp: 98 F (36.7 C)  TempSrc: Oral  Resp: 20  Height: 5\' 4"  (1.626 m)  Weight: 240 lb (108.863 kg)  SpO2: 100%

## 2015-07-31 NOTE — ED Notes (Signed)
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  Pt says she caught herself but felt something "rip" in the back of her r thigh.

## 2015-07-31 NOTE — ED Notes (Signed)
Pt made aware to return if symptoms worsen or if any life threatening symptoms occur.   

## 2015-07-31 NOTE — ED Notes (Signed)
Walked into room to d/c pt, pt was not in room, was seen by nt leaving about 30 minutes ago, unable to get vitals at this time due to pt not being in room, MD notified.

## 2016-01-23 ENCOUNTER — Other Ambulatory Visit (HOSPITAL_COMMUNITY): Payer: Self-pay | Admitting: Internal Medicine

## 2016-01-23 DIAGNOSIS — Z1231 Encounter for screening mammogram for malignant neoplasm of breast: Secondary | ICD-10-CM

## 2016-02-24 ENCOUNTER — Ambulatory Visit (HOSPITAL_COMMUNITY)
Admission: RE | Admit: 2016-02-24 | Discharge: 2016-02-24 | Disposition: A | Discharge status: Home / Self Care | Payer: Medicaid Other | Source: Ambulatory Visit | Attending: Internal Medicine | Admitting: Internal Medicine

## 2016-02-24 DIAGNOSIS — Z1231 Encounter for screening mammogram for malignant neoplasm of breast: Secondary | ICD-10-CM | POA: Insufficient documentation

## 2016-12-08 ENCOUNTER — Other Ambulatory Visit (HOSPITAL_COMMUNITY): Payer: Self-pay | Admitting: Internal Medicine

## 2016-12-08 ENCOUNTER — Ambulatory Visit (HOSPITAL_COMMUNITY)
Admission: RE | Admit: 2016-12-08 | Discharge: 2016-12-08 | Disposition: A | Discharge status: Home / Self Care | Payer: Medicaid Other | Source: Ambulatory Visit | Attending: Internal Medicine | Admitting: Internal Medicine

## 2016-12-08 DIAGNOSIS — M7989 Other specified soft tissue disorders: Secondary | ICD-10-CM | POA: Insufficient documentation

## 2016-12-08 DIAGNOSIS — M79671 Pain in right foot: Secondary | ICD-10-CM | POA: Insufficient documentation

## 2016-12-08 DIAGNOSIS — T148XXA Other injury of unspecified body region, initial encounter: Secondary | ICD-10-CM

## 2016-12-30 ENCOUNTER — Encounter: Payer: Self-pay | Admitting: Orthopaedic Surgery

## 2016-12-30 ENCOUNTER — Ambulatory Visit (INDEPENDENT_AMBULATORY_CARE_PROVIDER_SITE_OTHER): Payer: Medicaid Other | Admitting: Orthopaedic Surgery

## 2016-12-30 ENCOUNTER — Ambulatory Visit: Payer: Medicaid Other | Admitting: Orthopaedic Surgery

## 2016-12-30 VITALS — BP 134/71 | HR 73 | Temp 98.6°F | Ht 64.0 in | Wt 226.0 lb

## 2016-12-30 DIAGNOSIS — M1A0710 Idiopathic chronic gout, right ankle and foot, without tophus (tophi): Secondary | ICD-10-CM | POA: Diagnosis not present

## 2016-12-30 MED ORDER — ALLOPURINOL 300 MG PO TABS: 300.0000 mg | ORAL_TABLET | Freq: Every day | ORAL | 5 refills | Status: DC

## 2016-12-30 NOTE — Progress Notes (Signed)
Subjective:    Patient ID: Megan Salazar, female    DOB: 01/18/1961, 56 y.o.   MRN: [id number]  HPI She had injury to the right great toe in July.  She limped a while and it got better.  She re-injured the foot in September and x-rays were done on 12-08-16 showing: IMPRESSION: 1. Probable old or subacute avulsion fracture of the proximal phalanx of the right great toe. 2. Irregularity of the right first MTP joint may be degenerative, but with possible erosive changes, gout cannot be excluded. Correlate clinically.  She is much better with no pain or swelling of the foot.  She is walking well.  She does not know if gout runs in family or not.  She had serum uric acid done by family doctor and it is 7.6, elevated.  I will begin allopurinol.  She needs to take daily or every other day.  She is agreeable to this.   Review of Systems  HENT: Negative for congestion.   Respiratory: Positive for shortness of breath. Negative for cough.   Cardiovascular: Negative for chest pain and leg swelling.  Gastrointestinal: Positive for abdominal pain.  Endocrine: Positive for cold intolerance.  Musculoskeletal: Positive for arthralgias, back pain, gait problem and joint swelling.  Allergic/Immunologic: Positive for environmental allergies.   Past Medical History:  Diagnosis Date  . Asthma   . Bone spur   . Diabetes ([place])   . IBS (irritable bowel syndrome)    chronic diarrhea  . Paresthesias    left side  . Sciatica     Past Surgical History:  Procedure Laterality Date  . ABDOMINAL HYSTERECTOMY     partial  . CARPAL TUNNEL RELEASE     bilat, [place]  . CESAREAN SECTION    . COLONOSCOPY  07/18/2010   Fields-SIMPLE ADENOMA (Next colonoscopy  06/2020)    Current Outpatient Prescriptions on File Prior to Visit  Medication Sig Dispense Refill  . Alogliptin Benzoate (NESINA) 25 MG TABS Take 25 mg by mouth daily.    . fluticasone (FLOVENT HFA) 110 MCG/ACT inhaler Inhale 1 puff into  the lungs 2 (two) times daily as needed.     Marland Kitchen ibuprofen (ADVIL,MOTRIN) 800 MG tablet Take 1 tablet (800 mg total) by mouth 3 (three) times daily. 21 tablet 0  . lisinopril (PRINIVIL,ZESTRIL) 5 MG tablet Take 5 mg by mouth daily.    . montelukast (SINGULAIR) 10 MG tablet Take 10 mg by mouth at bedtime as needed.      No current facility-administered medications on file prior to visit.     Social History   Social History  . Marital status: Divorced    Spouse name: N/A  . Number of children: 1  . Years of education: N/A   Occupational History  . Disabled, previous [employer]-1989    Social History Main Topics  . Smoking status: Never Smoker  . Smokeless tobacco: Never Used  . Alcohol use No  . Drug use: No  . Sexual activity: Not on file   Other Topics Concern  . Not on file   Social History Narrative  . No narrative on file    Family History  Problem Relation Age of Onset  . Brain cancer Brother   . Diverticulitis Sister   . Coronary artery disease Father   . Diabetes Father     BP 134/71   Pulse 73   Temp 98.6 F (37 C)   Ht 5\' 4"  (1.626 m)   Wt  226 lb (102.5 kg)   BMI 38.79 kg/m      Objective:   Physical Exam  Constitutional: She is oriented to person, place, and time. She appears well-developed and well-nourished.  HENT:  Head: Normocephalic and atraumatic.  Eyes: Pupils are equal, round, and reactive to light. Conjunctivae and EOM are normal.  Neck: Normal range of motion. Neck supple.  Cardiovascular: Normal rate, regular rhythm and intact distal pulses.   Pulmonary/Chest: Effort normal.  Abdominal: Soft.  Musculoskeletal: She exhibits tenderness (She has no pain of the right foot, great toe, ROM is normal, gait is normal, NV is intact.).  Neurological: She is alert and oriented to person, place, and time. She displays normal reflexes. No cranial nerve deficit. She exhibits normal muscle tone. Coordination normal.  Skin: Skin is warm and dry.    Psychiatric: She has a normal mood and affect. Her behavior is normal. Judgment and thought content normal.  Nursing note reviewed.         Assessment & Plan:   Encounter Diagnosis  Name Primary?  . Idiopathic chronic gout of right foot without tophus Yes   I have given Rx for allopurinol.  Return as needed.  Call if any problem.  Precautions discussed.   Electronically Signed Sanjuana Kava, MD 10/10/201810:10 AM

## 2017-02-08 ENCOUNTER — Other Ambulatory Visit (HOSPITAL_COMMUNITY): Payer: Self-pay | Admitting: Internal Medicine

## 2017-02-08 DIAGNOSIS — Z1231 Encounter for screening mammogram for malignant neoplasm of breast: Secondary | ICD-10-CM

## 2017-02-24 ENCOUNTER — Ambulatory Visit (HOSPITAL_COMMUNITY)
Admission: RE | Admit: 2017-02-24 | Discharge: 2017-02-24 | Disposition: A | Discharge status: Home / Self Care | Payer: Medicaid Other | Source: Ambulatory Visit | Attending: Internal Medicine | Admitting: Internal Medicine

## 2017-02-24 ENCOUNTER — Encounter (HOSPITAL_COMMUNITY): Payer: Self-pay

## 2017-02-24 DIAGNOSIS — Z1231 Encounter for screening mammogram for malignant neoplasm of breast: Secondary | ICD-10-CM | POA: Diagnosis present

## 2017-03-17 ENCOUNTER — Ambulatory Visit (HOSPITAL_COMMUNITY): Payer: Medicaid Other

## 2017-10-26 ENCOUNTER — Other Ambulatory Visit (HOSPITAL_COMMUNITY): Payer: Self-pay | Admitting: Internal Medicine

## 2017-10-26 DIAGNOSIS — R7989 Other specified abnormal findings of blood chemistry: Secondary | ICD-10-CM

## 2017-10-26 DIAGNOSIS — R945 Abnormal results of liver function studies: Principal | ICD-10-CM

## 2017-11-02 ENCOUNTER — Ambulatory Visit (HOSPITAL_COMMUNITY): Payer: Medicaid Other

## 2017-11-02 ENCOUNTER — Encounter (HOSPITAL_COMMUNITY): Payer: Self-pay

## 2017-11-04 ENCOUNTER — Ambulatory Visit (HOSPITAL_COMMUNITY)
Admission: RE | Admit: 2017-11-04 | Discharge: 2017-11-04 | Disposition: A | Discharge status: Home / Self Care | Payer: Medicaid Other | Source: Ambulatory Visit | Attending: Internal Medicine | Admitting: Internal Medicine

## 2017-11-04 DIAGNOSIS — R1011 Right upper quadrant pain: Secondary | ICD-10-CM | POA: Diagnosis present

## 2017-11-04 DIAGNOSIS — R945 Abnormal results of liver function studies: Secondary | ICD-10-CM | POA: Insufficient documentation

## 2017-11-04 DIAGNOSIS — R7989 Other specified abnormal findings of blood chemistry: Secondary | ICD-10-CM

## 2017-11-12 ENCOUNTER — Encounter: Payer: Self-pay | Admitting: Gastroenterology

## 2018-01-19 ENCOUNTER — Encounter: Payer: Self-pay | Admitting: Gastroenterology

## 2018-01-19 ENCOUNTER — Ambulatory Visit: Payer: Medicaid Other | Admitting: Gastroenterology

## 2018-01-19 DIAGNOSIS — K7469 Other cirrhosis of liver: Secondary | ICD-10-CM | POA: Diagnosis not present

## 2018-01-19 DIAGNOSIS — Z1211 Encounter for screening for malignant neoplasm of colon: Secondary | ICD-10-CM | POA: Diagnosis not present

## 2018-01-19 DIAGNOSIS — K529 Noninfective gastroenteritis and colitis, unspecified: Secondary | ICD-10-CM

## 2018-01-19 DIAGNOSIS — K7460 Unspecified cirrhosis of liver: Secondary | ICD-10-CM | POA: Insufficient documentation

## 2018-01-19 NOTE — Assessment & Plan Note (Signed)
DUE TO IBS. SYMPTOMS FAIRLY WELL CONTROLLED WITH NATURAL SUPPLEMENTS.  CONTINUE TO MONITOR SYMPTOMS. PLEASE CALL WITH QUESTIONS OR CONCERNS. FOLLOW UP IN 1 YEAR.

## 2018-01-19 NOTE — Progress Notes (Signed)
ON RECALL  °

## 2018-01-19 NOTE — Assessment & Plan Note (Signed)
MOST LIKELY DUE TO NASH. WELL COMPENSATED DISEASE.  I PERSONALLY REVIEWED THE [place] AUG 2019 WITH DR. Ardeen Garland. CONTINUE YOUR WEIGHT LOSS EFFORTS. BE SURE TO GET ADEQUATE PROTEIN SO YOU BUILD MUSCLE. THE MORE MUSCLE YOU BUILD THE EASIER IT WILL BE TO LOSE WEIGHT. COMPLETE  ULTRASOUND IN FEB 2020. IF IT CONFIRMS CIRRHOSIS, YOU WILL NEED AN ULTRASOUND EVERY 6 MOS AND WE WILL NEED TO COMPLETE AN UPPER ENDOSCOPY. PLEASE CALL WITH QUESTIONS OR CONCERNS. FOLLOW UP IN 1 YEAR.   NEXT COLONOSCOPY IN APR 2022.

## 2018-01-19 NOTE — Progress Notes (Signed)
cc'ed to pcp °

## 2018-01-19 NOTE — Assessment & Plan Note (Addendum)
AVERAGE RISK. NO WARNING SIGNS/SYMPTOMS  NO NEED FOR STOOL TESTING. PT HAS HAD THE GOLD STANDARD FOR AGE APPROPRIATE COLON CANCER SCREENING NEXT COLONOSCOPY IN APR 2022.

## 2018-01-19 NOTE — Progress Notes (Signed)
Subjective:    Patient ID: Megan Salazar, female    DOB: 11/12/1960, 57 y.o.   MRN: [id number]   Rosita Fire, MD  HPI Doesn't feel bad. Swimming 9 miles a month. Eating healthier and cheats a few times. ADOPTED BONE BROTH DIET prior to swimming 5 days a week. 1-2 days nl meals. FEELS BETTER AND MAKES HER OWN BROTH. DOESN'T USE A LOT OF SALT. DOESN'T DRINK ETOH BUT COOKS WITH IT. BMs: AVG. 6-8 TIMES A DAY, DIARRHEA STIMULATED WITH CERTAIN SMELLS AND LAST TIME IT HAPPENED THE OTHER DAY. INTOLERANT OF SOY DUE TO DIARRHEA. GUT RUMBLES A LOT. DOES FLAX SEED OIL EVERY DAY TO PREVENT HEARTBURN/INDIGESTION. TAKES TUMERIC WITH OILY BASE.  PT DENIES FEVER, CHILLS, HEMATOCHEZIA, HEMATEMESIS, nausea, vomiting, melena, CHEST PAIN, SHORTNESS OF BREATH,  CHANGE IN BOWEL IN HABITS, constipation, abdominal pain, problems swallowing, OR heartburn or indigestion.  Past Medical History:  Diagnosis Date  . Asthma   . Bone spur   . Diabetes ([place])   . IBS (irritable bowel syndrome)    chronic diarrhea  . Paresthesias    left side  . Sciatica    Past Surgical History:  Procedure Laterality Date  . ABDOMINAL HYSTERECTOMY     partial  . CARPAL TUNNEL RELEASE     [place], [place]  . CESAREAN SECTION    . COLONOSCOPY  07/18/2010   Ronan Dion-SIMPLE ADENOMA (Next colonoscopy  06/2020)   Allergies  Allergen Reactions  . Equal [Aspartame]     PT SAID SHE IS HIGHLY ALLERGIC TO ARTIFICIAL SWEETNERS. GETS DIARRHEA EXTREMELY BAD AND HIVES.   Marland Kitchen Codeine Nausea And Vomiting    Liquid codeine  . Amoxicillin   . Soy Allergy     All soy products  . Sulfa Drugs Cross Reactors Nausea And Vomiting   Current Outpatient Medications  Medication Sig    . CALCIUM-MAGNESIUM-VITAMIN D PO Take by mouth daily.    Marland Kitchen CINNAMON PO Take by mouth daily.    . Coenzyme Q10 (COQ10) 200 MG CAPS Take by mouth daily.    . Flaxseed, Linseed, (FLAXSEED OIL PO) Take by mouth daily.    . fluticasone (FLOVENT HFA) 110 MCG/ACT  inhaler Inhale 1 puff into the lungs 2 (two) times daily as needed.     Marland Kitchen lisinopril (PRINIVIL,ZESTRIL) 5 MG tablet Take 5 mg by mouth 2 (two) times daily.     . Misc Natural Products (TART CHERRY ADVANCED PO) Take by mouth daily.    . montelukast (SINGULAIR) 10 MG tablet Take 10 mg by mouth at bedtime as needed.     Marland Kitchen OLIVE LEAF PO Take by mouth. 3 times per week    . Olopatadine HCl (PATADAY OP) Apply to eye as needed.    . TURMERIC PO Take by mouth daily.    .      . Alogliptin Benzoate (NESINA) 25 MG TABS Take 25 mg by mouth daily.    .       Review of Systems PER HPI OTHERWISE ALL SYSTEMS ARE NEGATIVE.    Objective:   Physical Exam  Constitutional: She is oriented to person, place, and time. She appears well-developed and well-nourished. No distress.  HENT:  Head: Normocephalic and atraumatic.  Mouth/Throat: Oropharynx is clear and moist. No oropharyngeal exudate.  Eyes: Pupils are equal, round, and reactive to light. No scleral icterus.  Neck: Normal range of motion. Neck supple.  Cardiovascular: Normal rate, regular rhythm and normal heart sounds.  Pulmonary/Chest: Effort normal and breath  sounds normal. No respiratory distress.  Abdominal: Soft. Bowel sounds are normal. She exhibits no distension. There is no tenderness.  LARGE PANNUS  Musculoskeletal: She exhibits no edema.  Lymphadenopathy:    She has no cervical adenopathy.  Neurological: She is alert and oriented to person, place, and time.  NO  NEW FOCAL DEFICITS  Psychiatric: She has a normal mood and affect.  Vitals reviewed.     Assessment & Plan:

## 2018-01-19 NOTE — Patient Instructions (Signed)
CONTINUE YOUR WEIGHT LOSS EFFORTS. BE SURE TO GET ADEQUATE PROTEIN SO YOU BUILD MUSCLE. THE MORE MUSCLE YOU BUILD THE EASIER IT WILL BE TO LOSE WEIGHT.   COMPLETE  ULTRASOUND IN FEB 2020. IF IT CONFIRMS CIRRHOSIS, YOU WILL NEED AN ULTRASOUND EVERY 6 MOS AND WE WILL NEED TO COMPLETE AN UPPER ENDOSCOPY.  PLEASE CALL WITH QUESTIONS OR CONCERNS.  FOLLOW UP IN 1 YEAR.   NEXT COLONOSCOPY IN APR 2022.

## 2018-01-20 ENCOUNTER — Other Ambulatory Visit (HOSPITAL_COMMUNITY): Payer: Self-pay | Admitting: Internal Medicine

## 2018-01-20 DIAGNOSIS — Z1231 Encounter for screening mammogram for malignant neoplasm of breast: Secondary | ICD-10-CM

## 2018-02-28 ENCOUNTER — Ambulatory Visit (HOSPITAL_COMMUNITY)
Admission: RE | Admit: 2018-02-28 | Discharge: 2018-02-28 | Disposition: A | Discharge status: Home / Self Care | Payer: Medicaid Other | Source: Ambulatory Visit | Attending: Internal Medicine | Admitting: Internal Medicine

## 2018-02-28 DIAGNOSIS — Z1231 Encounter for screening mammogram for malignant neoplasm of breast: Secondary | ICD-10-CM | POA: Insufficient documentation

## 2018-04-06 ENCOUNTER — Telehealth: Payer: Self-pay | Admitting: Gastroenterology

## 2018-04-06 NOTE — Telephone Encounter (Signed)
Recall for ultrasound 

## 2018-04-06 NOTE — Telephone Encounter (Signed)
Letter mailed

## 2018-04-18 ENCOUNTER — Telehealth: Payer: Self-pay | Admitting: Gastroenterology

## 2018-04-18 NOTE — Telephone Encounter (Signed)
Pt received letter to schedule her U/S in February. She will be at the pool between 11 am- 1 pm. You can call either before 11 or after 1 per patient. [phone]

## 2018-04-18 NOTE — Telephone Encounter (Signed)
Per last OV patient is to complete U/S in Febraury 2020. Last u/s done was RUQ in 11/2017 by PCP. Please advise Dr. Oneida Alar if we should order RUQ or complete? Thanks

## 2018-04-19 NOTE — Telephone Encounter (Signed)
Called pt, mail box is full and could not leave a message.

## 2018-04-19 NOTE — Telephone Encounter (Signed)
REVIEWED labs from AUG 2019. PT MAY WAIT UNTIL AUG 202 FOR REPEAT RUQ U/S, DX; FATTY LIVER. PT ALSO NEED HFP IN AUG 2020.

## 2018-04-19 NOTE — Telephone Encounter (Signed)
Pt left VM that she is suppose to have a different [place]. She said she received a letter about scheduling and she is gone from home a lot. Said she should be home Friday morning before 11:00 am.  Forwarding to [place] who is working on her [place].

## 2018-04-19 NOTE — Telephone Encounter (Signed)
fowarding to doris and stacey to [place]

## 2018-04-20 NOTE — Telephone Encounter (Signed)
ON RECALL  °

## 2018-04-21 ENCOUNTER — Other Ambulatory Visit: Payer: Self-pay

## 2018-04-21 DIAGNOSIS — K760 Fatty (change of) liver, not elsewhere classified: Secondary | ICD-10-CM

## 2018-04-21 NOTE — Telephone Encounter (Signed)
Lab order on file for August 2020.

## 2018-04-21 NOTE — Telephone Encounter (Signed)
PT is aware.

## 2018-04-21 NOTE — Telephone Encounter (Signed)
LMOM to call.

## 2018-09-13 ENCOUNTER — Telehealth: Payer: Self-pay | Admitting: Gastroenterology

## 2018-09-13 NOTE — Telephone Encounter (Signed)
RECALL FOR ULTRASOUND 

## 2018-09-13 NOTE — Telephone Encounter (Signed)
Patient due in august. Letter mailed

## 2018-12-21 ENCOUNTER — Encounter: Payer: Self-pay | Admitting: Gastroenterology

## 2019-01-30 ENCOUNTER — Other Ambulatory Visit (HOSPITAL_COMMUNITY): Payer: Self-pay | Admitting: Internal Medicine

## 2019-01-30 DIAGNOSIS — Z1231 Encounter for screening mammogram for malignant neoplasm of breast: Secondary | ICD-10-CM

## 2019-03-06 ENCOUNTER — Other Ambulatory Visit: Payer: Self-pay

## 2019-03-06 ENCOUNTER — Ambulatory Visit (HOSPITAL_COMMUNITY)
Admission: RE | Admit: 2019-03-06 | Discharge: 2019-03-06 | Disposition: A | Discharge status: Home / Self Care | Payer: Medicaid Other | Source: Ambulatory Visit | Attending: Internal Medicine | Admitting: Internal Medicine

## 2019-03-06 DIAGNOSIS — Z1231 Encounter for screening mammogram for malignant neoplasm of breast: Secondary | ICD-10-CM | POA: Diagnosis not present

## 2019-04-20 ENCOUNTER — Other Ambulatory Visit: Payer: Self-pay | Admitting: Gastroenterology

## 2019-04-20 DIAGNOSIS — K760 Fatty (change of) liver, not elsewhere classified: Secondary | ICD-10-CM

## 2019-09-18 ENCOUNTER — Emergency Department (HOSPITAL_COMMUNITY)
Admission: EM | Admit: 2019-09-18 | Discharge: 2019-09-18 | Disposition: A | Discharge status: Home / Self Care | Payer: Medicaid Other | Attending: Emergency Medicine | Admitting: Emergency Medicine

## 2019-09-18 ENCOUNTER — Encounter (HOSPITAL_COMMUNITY): Payer: Self-pay

## 2019-09-18 ENCOUNTER — Other Ambulatory Visit: Payer: Self-pay

## 2019-09-18 DIAGNOSIS — Z7951 Long term (current) use of inhaled steroids: Secondary | ICD-10-CM | POA: Diagnosis not present

## 2019-09-18 DIAGNOSIS — E119 Type 2 diabetes mellitus without complications: Secondary | ICD-10-CM | POA: Diagnosis not present

## 2019-09-18 DIAGNOSIS — Z20818 Contact with and (suspected) exposure to other bacterial communicable diseases: Secondary | ICD-10-CM | POA: Diagnosis present

## 2019-09-18 DIAGNOSIS — Z79899 Other long term (current) drug therapy: Secondary | ICD-10-CM | POA: Diagnosis not present

## 2019-09-18 DIAGNOSIS — J45909 Unspecified asthma, uncomplicated: Secondary | ICD-10-CM | POA: Diagnosis not present

## 2019-09-18 NOTE — Discharge Instructions (Signed)
Your exposure to MRSA is not life-threatening, and there is nothing to do for this.  There is no indication to test you as you do not have any wounds or upcoming surgeries.  Return to the ER if your symptoms worsen.

## 2019-09-18 NOTE — ED Provider Notes (Signed)
[facility] Provider Note   CSN: [id number] Arrival date & time: 09/18/19  1710     History Chief Complaint  Patient presents with  . Herold Harms exposure    Megan Salazar is a 59 y.o. female.  HPI 59 year old female with a history of diabetes, IBS, sciatica, asthma presents to the ER with concerns over MRSA exposure.  Patient states that the patient's daughter was admitted to the hospital and had surgery today, and later found out that she had tested positive for MRSA.  She presents to the ER wanted to get tested and treated.  She denies any wounds, fevers, chills or any other symptoms at this time.  She is just concerned over her exposure.    Past Medical History:  Diagnosis Date  . Asthma   . Bone spur   . Diabetes ([place])   . IBS (irritable bowel syndrome)    chronic diarrhea  . Paresthesias    left side  . Sciatica     Patient Active Problem List   Diagnosis Date Noted  . Hepatic cirrhosis ([place]) 01/19/2018  . Colon cancer screening 01/19/2018  . Chronic diarrhea 12/31/2011    Past Surgical History:  Procedure Laterality Date  . ABDOMINAL HYSTERECTOMY     partial  . CARPAL TUNNEL RELEASE     [place], [place]  . CESAREAN SECTION    . COLONOSCOPY  07/18/2010   Fields-SIMPLE ADENOMA (Next colonoscopy  06/2020)     OB History   No obstetric history on file.     Family History  Problem Relation Age of Onset  . Coronary artery disease Father   . Diabetes Father   . Brain cancer Brother   . Diverticulitis Sister   . Colon cancer Neg Hx   . Colon polyps Neg Hx     Social History   Tobacco Use  . Smoking status: Never Smoker  . Smokeless tobacco: Never Used  Substance Use Topics  . Alcohol use: Yes    Comment: occas  . Drug use: No    Home Medications Prior to Admission medications   Medication Sig Start Date End Date Taking? Authorizing Provider  allopurinol (ZYLOPRIM) 300 MG tablet Take 1 tablet (300 mg total) by mouth  daily. Patient not taking: Reported on 01/19/2018 12/30/16   Sanjuana Kava, MD  Alogliptin Benzoate (NESINA) 25 MG TABS Take 25 mg by mouth daily.    [provider]  CALCIUM-MAGNESIUM-VITAMIN D PO Take by mouth daily.    [provider]  Charcoal Activated 260 MG CAPS Take 260 mg by mouth once a week.    [provider]  CINNAMON PO Take by mouth daily.    [provider]  Coenzyme Q10 (COQ10) 200 MG CAPS Take by mouth daily.    [provider]  Flaxseed, Linseed, (FLAXSEED OIL PO) Take by mouth daily.    [provider]  fluticasone (FLOVENT HFA) 110 MCG/ACT inhaler Inhale 1 puff into the lungs 2 (two) times daily as needed.     [provider]  ibuprofen (ADVIL,MOTRIN) 800 MG tablet Take 1 tablet (800 mg total) by mouth 3 (three) times daily. Patient not taking: Reported on 01/19/2018 11/29/12   Milton Ferguson, MD  lisinopril (PRINIVIL,ZESTRIL) 5 MG tablet Take 5 mg by mouth 2 (two) times daily.     [provider]  Misc Natural Products (TART CHERRY ADVANCED PO) Take by mouth daily.    [provider]  montelukast (SINGULAIR) 10 MG tablet  Take 10 mg by mouth at bedtime as needed.     [provider]  OLIVE LEAF PO Take by mouth. 3 times per week    [provider]  Olopatadine HCl (PATADAY OP) Apply to eye as needed.    [provider]  TURMERIC PO Take by mouth daily.    [provider]    Allergies    Equal [aspartame], Codeine, Amoxicillin, Soy allergy, and Sulfa drugs cross reactors  Review of Systems   Review of Systems  Constitutional: Negative for chills and fever.  Gastrointestinal: Negative for abdominal pain, nausea and vomiting.  Skin: Negative for rash and wound.    Physical Exam Updated Vital Signs BP (!) 153/62 (BP Location: Left Arm)   Pulse 80   Temp 98.5 F (36.9 C) (Oral)   Resp 20   Ht 5\' 4"  (1.626 m)   Wt 100.7 kg   SpO2 98%   BMI 38.11  kg/m   Physical Exam Vitals and nursing note reviewed.  Constitutional:      General: She is not in acute distress.    Appearance: Normal appearance. She is well-developed. She is not ill-appearing, toxic-appearing or diaphoretic.  HENT:     Head: Normocephalic and atraumatic.  Eyes:     Conjunctiva/sclera: Conjunctivae normal.     Pupils: Pupils are equal, round, and reactive to light.  Cardiovascular:     Rate and Rhythm: Normal rate and regular rhythm.     Heart sounds: No murmur heard.   Pulmonary:     Effort: Pulmonary effort is normal. No respiratory distress.     Breath sounds: Normal breath sounds.  Abdominal:     General: Abdomen is flat.     Palpations: Abdomen is soft.     Tenderness: There is no abdominal tenderness.  Musculoskeletal:        General: No swelling or tenderness. Normal range of motion.     Cervical back: Neck supple.  Skin:    General: Skin is warm and dry.     Findings: No erythema or rash.  Neurological:     General: No focal deficit present.     Mental Status: She is alert and oriented to person, place, and time.  Psychiatric:        Mood and Affect: Mood normal.        Behavior: Behavior normal.     ED Results / Procedures / Treatments   Labs (all labs ordered are listed, but only abnormal results are displayed) Labs Reviewed - No data to display  EKG None  Radiology No results found.  Procedures Procedures (including critical care time)  Medications Ordered in ED Medications - No data to display  ED Course  I have reviewed the triage vital signs and the nursing notes.  Pertinent labs & imaging results that were available during my care of the patient were reviewed by me and considered in my medical decision making (see chart for details).    MDM Rules/Calculators/A&P                         Patient with concerns of MRSA exposure, with no symptoms.  No wounds.  No infectious symptoms.  No indication to test her.  I  reassured the patient that the there is no reason for [place] to test her, and even if she tested positive there is no reason to treat her.  Patient overall reassured, voices understanding is agreeable to  this plan.  At this stage in the ED course, the patient is medically screened and stable for discharge. 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

## 2019-09-18 NOTE — ED Triage Notes (Signed)
Pt says her daughter tested positive for mrsa when she was tested prior to having surgery.  Pt denies any symptoms.

## 2019-10-02 ENCOUNTER — Encounter (HOSPITAL_COMMUNITY): Payer: Self-pay | Admitting: Emergency Medicine

## 2019-10-02 ENCOUNTER — Other Ambulatory Visit: Payer: Self-pay

## 2019-10-02 ENCOUNTER — Emergency Department (HOSPITAL_COMMUNITY)
Admission: EM | Admit: 2019-10-02 | Discharge: 2019-10-02 | Disposition: A | Discharge status: Home / Self Care | Payer: Medicaid Other | Attending: Emergency Medicine | Admitting: Emergency Medicine

## 2019-10-02 DIAGNOSIS — R112 Nausea with vomiting, unspecified: Secondary | ICD-10-CM | POA: Diagnosis present

## 2019-10-02 DIAGNOSIS — E119 Type 2 diabetes mellitus without complications: Secondary | ICD-10-CM | POA: Diagnosis not present

## 2019-10-02 DIAGNOSIS — R197 Diarrhea, unspecified: Secondary | ICD-10-CM | POA: Insufficient documentation

## 2019-10-02 DIAGNOSIS — J45909 Unspecified asthma, uncomplicated: Secondary | ICD-10-CM | POA: Insufficient documentation

## 2019-10-02 DIAGNOSIS — N390 Urinary tract infection, site not specified: Secondary | ICD-10-CM | POA: Diagnosis not present

## 2019-10-02 DIAGNOSIS — A084 Viral intestinal infection, unspecified: Secondary | ICD-10-CM

## 2019-10-02 LAB — CBC WITH DIFFERENTIAL/PLATELET
Abs Immature Granulocytes: 0.03 10*3/uL (ref 0.00–0.07)
Basophils Absolute: 0 10*3/uL (ref 0.0–0.1)
Basophils Relative: 0 %
Eosinophils Absolute: 0 10*3/uL (ref 0.0–0.5)
Eosinophils Relative: 0 %
HCT: 46.8 % — ABNORMAL HIGH (ref 36.0–46.0)
Hemoglobin: 15.1 g/dL — ABNORMAL HIGH (ref 12.0–15.0)
Immature Granulocytes: 0 %
Lymphocytes Relative: 3 %
Lymphs Abs: 0.3 10*3/uL — ABNORMAL LOW (ref 0.7–4.0)
MCH: 29.8 pg (ref 26.0–34.0)
MCHC: 32.3 g/dL (ref 30.0–36.0)
MCV: 92.5 fL (ref 80.0–100.0)
Monocytes Absolute: 0.4 10*3/uL (ref 0.1–1.0)
Monocytes Relative: 4 %
Neutro Abs: 7.5 10*3/uL (ref 1.7–7.7)
Neutrophils Relative %: 93 %
Platelets: 206 10*3/uL (ref 150–400)
RBC: 5.06 MIL/uL (ref 3.87–5.11)
RDW: 13.6 % (ref 11.5–15.5)
WBC: 8.1 10*3/uL (ref 4.0–10.5)
nRBC: 0 % (ref 0.0–0.2)

## 2019-10-02 LAB — URINALYSIS, ROUTINE W REFLEX MICROSCOPIC
Bilirubin Urine: NEGATIVE
Glucose, UA: 50 mg/dL — AB
Ketones, ur: NEGATIVE mg/dL
Leukocytes,Ua: NEGATIVE
Nitrite: POSITIVE — AB
Protein, ur: NEGATIVE mg/dL
Specific Gravity, Urine: 1.021 (ref 1.005–1.030)
pH: 5 (ref 5.0–8.0)

## 2019-10-02 LAB — COMPREHENSIVE METABOLIC PANEL
ALT: 18 U/L (ref 0–44)
AST: 22 U/L (ref 15–41)
Albumin: 4.1 g/dL (ref 3.5–5.0)
Alkaline Phosphatase: 59 U/L (ref 38–126)
Anion gap: 13 (ref 5–15)
BUN: 24 mg/dL — ABNORMAL HIGH (ref 8–23)
CO2: 23 mmol/L (ref 22–32)
Calcium: 9 mg/dL (ref 8.9–10.3)
Chloride: 100 mmol/L (ref 98–111)
Creatinine, Ser: 0.81 mg/dL (ref 0.44–1.00)
GFR calc Af Amer: >60 mL/min (ref >60)
GFR calc non Af Amer: >60 mL/min (ref >60)
Glucose, Bld: 234 mg/dL — ABNORMAL HIGH (ref 70–99)
Potassium: 3.9 mmol/L (ref 3.5–5.1)
Sodium: 136 mmol/L (ref 135–145)
Total Bilirubin: 1.7 mg/dL — ABNORMAL HIGH (ref 0.3–1.2)
Total Protein: 7.8 g/dL (ref 6.5–8.1)

## 2019-10-02 LAB — LIPASE, BLOOD: Lipase: 24 U/L (ref 11–51)

## 2019-10-02 MED ORDER — SODIUM CHLORIDE 0.9 % IV BOLUS
1000.0000 mL | Freq: Once | INTRAVENOUS | Status: AC
  Administered 2019-10-02: 19:00:00 1000 mL via INTRAVENOUS

## 2019-10-02 MED ORDER — CEPHALEXIN 500 MG PO CAPS
500.0000 mg | ORAL_CAPSULE | Freq: Two times a day (BID) | ORAL | 0 refills | Status: AC
Start: 2019-10-02 — End: 2019-10-07

## 2019-10-02 MED ORDER — ONDANSETRON HCL 4 MG/2ML IJ SOLN
4.0000 mg | Freq: Once | INTRAMUSCULAR | Status: AC
  Administered 2019-10-02: 4 mg via INTRAVENOUS
  Filled 2019-10-02: qty 2

## 2019-10-02 MED ORDER — SODIUM CHLORIDE 0.9 % IV SOLN
1.0000 g | Freq: Once | INTRAVENOUS | Status: AC
  Administered 2019-10-02: 1 g via INTRAVENOUS
  Filled 2019-10-02: qty 10

## 2019-10-02 MED ORDER — ONDANSETRON 4 MG PO TBDP: 4.0000 mg | ORAL_TABLET | Freq: Three times a day (TID) | ORAL | 0 refills | Status: DC | PRN

## 2019-10-02 NOTE — ED Notes (Signed)
PO challenge --Pt given water to drink

## 2019-10-02 NOTE — ED Provider Notes (Signed)
  Patient signed out to me by Eustaquio Maize, PA-C at end of shift pending oral fluid challenge and reassessment.  Patient here for nausea, vomiting, and diarrhea.  No abdominal pain.  No leukocytosis.  Urinalysis was positive for nitrates and 6-10 WBCs.  Urine culture pending patient was given Rocephin and will be discharged with prescription for Keflex.  She has received IV fluids, Zofran and tolerated p.o. challenge well.  She is ambulatory to the restroom several times without difficulty.  Is felt that she is appropriate for discharge home agrees to continue oral hydration and close outpatient follow-up.   Kem Parkinson, PA-C 10/02/19 2222    Fredia Sorrow, MD 10/04/19 [phone]

## 2019-10-02 NOTE — Discharge Instructions (Addendum)
Your urine appears infected today. Please pick up antibiotics and take as prescribed.  We have sent your urine for culture and will call in 2-3 days IF the antibiotic needs to be changed.  I have provided zofran to take as needed. Please follow up with your PCP regarding your ED visit today.  Return to the ED IMMEDIATELY for any worsening symptoms

## 2019-10-02 NOTE — ED Triage Notes (Signed)
Pt here by [facility] c/o n/v/d and headache since last night; denies abd pain

## 2019-10-02 NOTE — ED Provider Notes (Signed)
[facility] EMERGENCY DEPARTMENT Provider Note   CSN: [id number] Arrival date & time: 10/02/19  1227     History Chief Complaint  Patient presents with  . Nausea    Megan Salazar is a 59 y.o. female with PMHx IBS-D and diabetes who presents to the ED today with complaint of sudden onset nausea and NBNB emesis that began around 2 AM this morning.  Patient also complains of watery diarrhea.  She states that she has been unable to keep anything down prompting her to come to the ED today as she feels she is very dehydrated.  She does report that for the past 2 days she has been out with friends celebrating and is concerned she may have ate something that upset her stomach.  She states that she has very mild abdominal pain when using the restroom however none at rest.  She has tried taking over-the-counter medication for her symptoms however continues to vomit it back up.  She called her PCP today was told to come to the ED for further evaluation.  Patient denies fevers, chills, blood in stool, melena, urinary symptoms, any other associated symptoms.  Does have history of diabetes however states that she took her self off of her medications 5 years ago as she reports an allergy to "hormone therapy."   The history is provided by the patient and medical records.       Past Medical History:  Diagnosis Date  . Asthma   . Bone spur   . Diabetes ([place])   . IBS (irritable bowel syndrome)    chronic diarrhea  . Paresthesias    left side  . Sciatica     Patient Active Problem List   Diagnosis Date Noted  . Hepatic cirrhosis ([place]) 01/19/2018  . Colon cancer screening 01/19/2018  . Chronic diarrhea 12/31/2011    Past Surgical History:  Procedure Laterality Date  . ABDOMINAL HYSTERECTOMY     partial  . CARPAL TUNNEL RELEASE     bilat, [place]  . CESAREAN SECTION    . COLONOSCOPY  07/18/2010   Fields-SIMPLE ADENOMA (Next colonoscopy  06/2020)     OB History   No obstetric  history on file.     Family History  Problem Relation Age of Onset  . Coronary artery disease Father   . Diabetes Father   . Brain cancer Brother   . Diverticulitis Sister   . Colon cancer Neg Hx   . Colon polyps Neg Hx     Social History   Tobacco Use  . Smoking status: Never Smoker  . Smokeless tobacco: Never Used  Vaping Use  . Vaping Use: Former  Substance Use Topics  . Alcohol use: Yes    Comment: occas  . Drug use: No    Home Medications Prior to Admission medications   Medication Sig Start Date End Date Taking? Authorizing Provider  allopurinol (ZYLOPRIM) 300 MG tablet Take 1 tablet (300 mg total) by mouth daily. Patient not taking: Reported on 01/19/2018 12/30/16   Sanjuana Kava, MD  Alogliptin Benzoate (NESINA) 25 MG TABS Take 25 mg by mouth daily.    [provider]  CALCIUM-MAGNESIUM-VITAMIN D PO Take by mouth daily.    [provider]  cephALEXin (KEFLEX) 500 MG capsule Take 1 capsule (500 mg total) by mouth 2 (two) times daily for 5 days. 10/02/19 10/07/19  Eustaquio Maize, PA-C  Charcoal Activated 260 MG CAPS Take 260 mg by mouth once a week.  [provider]  CINNAMON PO Take by mouth daily.    [provider]  Coenzyme Q10 (COQ10) 200 MG CAPS Take by mouth daily.    [provider]  Flaxseed, Linseed, (FLAXSEED OIL PO) Take by mouth daily.    [provider]  fluticasone (FLOVENT HFA) 110 MCG/ACT inhaler Inhale 1 puff into the lungs 2 (two) times daily as needed.     [provider]  ibuprofen (ADVIL,MOTRIN) 800 MG tablet Take 1 tablet (800 mg total) by mouth 3 (three) times daily. Patient not taking: Reported on 01/19/2018 11/29/12   Milton Ferguson, MD  lisinopril (PRINIVIL,ZESTRIL) 5 MG tablet Take 5 mg by mouth 2 (two) times daily.     [provider]  Misc Natural Products (TART CHERRY ADVANCED PO) Take by mouth daily.    [provider]  montelukast (SINGULAIR) 10 MG tablet  Take 10 mg by mouth at bedtime as needed.     [provider]  OLIVE LEAF PO Take by mouth. 3 times per week    [provider]  Olopatadine HCl (PATADAY OP) Apply to eye as needed.    [provider]  ondansetron (ZOFRAN ODT) 4 MG disintegrating tablet Take 1 tablet (4 mg total) by mouth every 8 (eight) hours as needed for nausea or vomiting. 10/02/19   Eustaquio Maize, PA-C  TURMERIC PO Take by mouth daily.    [provider]    Allergies    Equal [aspartame], Codeine, Amoxicillin, Soy allergy, and Sulfa drugs cross reactors  Review of Systems   Review of Systems  Constitutional: Negative for chills and fever.  Gastrointestinal: Positive for diarrhea, nausea and vomiting.  All other systems reviewed and are negative.   Physical Exam Updated Vital Signs BP (!) 151/76 (BP Location: Left Arm)   Pulse 81 Comment: Simultaneous filing. User may not have seen previous data.  Temp 98.1 F (36.7 C) (Tympanic) Comment: Simultaneous filing. User may not have seen previous data. Comment (Src): Simultaneous filing. User may not have seen previous data.  Resp 18   Ht 5\' 4"  (1.626 m)   Wt 100.7 kg   SpO2 99%   BMI 38.11 kg/m   Physical Exam Vitals and nursing note reviewed.  Constitutional:      Appearance: She is not ill-appearing or diaphoretic.  HENT:     Head: Normocephalic and atraumatic.     Mouth/Throat:     Mouth: Mucous membranes are dry.  Eyes:     Conjunctiva/sclera: Conjunctivae normal.  Cardiovascular:     Rate and Rhythm: Normal rate and regular rhythm.     Pulses: Normal pulses.  Pulmonary:     Effort: Pulmonary effort is normal.     Breath sounds: Normal breath sounds. No wheezing, rhonchi or rales.  Abdominal:     Palpations: Abdomen is soft.     Tenderness: There is no abdominal tenderness. There is no right CVA tenderness, left CVA tenderness, guarding or rebound.  Musculoskeletal:     Cervical back: Neck supple.  Skin:     General: Skin is warm and dry.  Neurological:     Mental Status: She is alert.     ED Results / Procedures / Treatments   Labs (all labs ordered are listed, but only abnormal results are displayed) Labs Reviewed  COMPREHENSIVE METABOLIC PANEL - Abnormal; Notable for the following components:      Result Value   Glucose, Bld 234 (*)    BUN 24 (*)  Total Bilirubin 1.7 (*)    All other components within normal limits  CBC WITH DIFFERENTIAL/PLATELET - Abnormal; Notable for the following components:   Hemoglobin 15.1 (*)    HCT 46.8 (*)    Lymphs Abs 0.3 (*)    All other components within normal limits  URINALYSIS, ROUTINE W REFLEX MICROSCOPIC - Abnormal; Notable for the following components:   APPearance HAZY (*)    Glucose, UA 50 (*)    Hgb urine dipstick SMALL (*)    Nitrite POSITIVE (*)    Bacteria, UA RARE (*)    All other components within normal limits  URINE CULTURE  LIPASE, BLOOD  CBG MONITORING, ED    EKG EKG Interpretation  Date/Time:  Monday October 02 2019 17:30:22 EDT Ventricular Rate:  80 PR Interval:  162 QRS Duration: 68 QT Interval:  374 QTC Calculation: 431 R Axis:   -10 Text Interpretation: Normal sinus rhythm Anterolateral infarct , age undetermined Abnormal ECG No previous ECGs available Confirmed by Fredia Sorrow [phone]) on 10/02/2019 5:42:13 PM   Radiology No results found.  Procedures Procedures (including critical care time)  Medications Ordered in ED Medications  cefTRIAXone (ROCEPHIN) 1 g in sodium chloride 0.9 % 100 mL IVPB (has no administration in time range)  sodium chloride 0.9 % bolus 1,000 mL (1,000 mLs Intravenous New Bag/Given 10/02/19 1841)  ondansetron (ZOFRAN) injection 4 mg (4 mg Intravenous Given 10/02/19 1848)    ED Course  I have reviewed the triage vital signs and the nursing notes.  Pertinent labs & imaging results that were available during my care of the patient were reviewed by me and considered in my medical  decision making (see chart for details).    MDM Rules/Calculators/A&P                          59 year old female who presents to the ED today with complaint of nausea, vomiting, diarrhea that woke her up out of her sleep at 2 AM this morning.  Is concerned that she may have ate something out of the ordinary causing an upset stomach however patient does have history of IBS with diarrhea.  Arrival to the ED patient is afebrile, nontachycardic and nontachypneic.  She appears in no acute distress.  She does have dry mucous membranes.  She has no focal abdominal tenderness on exam.  Will provide fluids at this time and obtain screening labs.  Plan to p.o. challenge prior to discharge and will likely discharge home with nausea medication.  Suspect viral gastroenteritis.   CBC without leukocytosis. Hgb 15.1/Hct 46.8; appears hemoconcentrated.  CMP with glucose 234. BUN 24. T bili slightly elevated at 1.7. Pt without abdominal TTP. I have low suspicion for gallbladder etiology at this time. No other LFT elevations.  Lipase 24.  U/A with small hgb, positive nitrites, and 6-10 WBCs, rare bacteria. Will provide rocephin at this time and plan to dispo with keflex. She is without CVA TTP. I doubt acute pyelo at this time.   At shift change case signed out to [facility], PA-C, who will PO challenge after fluids and zofran.   Final Clinical Impression(s) / ED Diagnoses Final diagnoses:  Nausea, vomiting and diarrhea  Viral gastroenteritis  Lower urinary tract infectious disease    Rx / DC Orders ED Discharge Orders         Ordered    cephALEXin (KEFLEX) 500 MG capsule  2 times daily     Discontinue  Reprint     10/02/19 1842    ondansetron (ZOFRAN ODT) 4 MG disintegrating tablet  Every 8 hours PRN     Discontinue  Reprint     10/02/19 1843           Eustaquio Maize, PA-C 10/02/19 1902    Fredia Sorrow, MD 10/04/19 [phone]

## 2019-10-02 NOTE — ED Notes (Signed)
Pt has finished drinking water. Denies nausea this time.

## 2019-10-05 LAB — URINE CULTURE: Culture: >=100000 — AB

## 2020-01-29 ENCOUNTER — Other Ambulatory Visit (HOSPITAL_COMMUNITY): Payer: Self-pay | Admitting: Internal Medicine

## 2020-01-29 DIAGNOSIS — Z1231 Encounter for screening mammogram for malignant neoplasm of breast: Secondary | ICD-10-CM

## 2020-03-08 ENCOUNTER — Ambulatory Visit (HOSPITAL_COMMUNITY)
Admission: RE | Admit: 2020-03-08 | Discharge: 2020-03-08 | Disposition: A | Discharge status: Home / Self Care | Payer: Medicaid Other | Source: Ambulatory Visit | Attending: Internal Medicine | Admitting: Internal Medicine

## 2020-03-08 ENCOUNTER — Other Ambulatory Visit: Payer: Self-pay

## 2020-03-08 DIAGNOSIS — Z1231 Encounter for screening mammogram for malignant neoplasm of breast: Secondary | ICD-10-CM | POA: Diagnosis present

## 2020-03-23 DIAGNOSIS — J189 Pneumonia, unspecified organism: Secondary | ICD-10-CM

## 2020-03-23 HISTORY — DX: Pneumonia, unspecified organism: J18.9

## 2020-03-25 ENCOUNTER — Encounter: Payer: Self-pay | Admitting: Emergency Medicine

## 2020-03-25 ENCOUNTER — Other Ambulatory Visit: Payer: Self-pay

## 2020-03-25 ENCOUNTER — Ambulatory Visit
Admission: EM | Admit: 2020-03-25 | Discharge: 2020-03-25 | Disposition: A | Discharge status: Home / Self Care | Payer: BLUE CROSS/BLUE SHIELD | Attending: Family Medicine | Admitting: Family Medicine

## 2020-03-25 DIAGNOSIS — R809 Proteinuria, unspecified: Secondary | ICD-10-CM

## 2020-03-25 DIAGNOSIS — R197 Diarrhea, unspecified: Secondary | ICD-10-CM

## 2020-03-25 DIAGNOSIS — R42 Dizziness and giddiness: Secondary | ICD-10-CM

## 2020-03-25 DIAGNOSIS — B370 Candidal stomatitis: Secondary | ICD-10-CM

## 2020-03-25 DIAGNOSIS — R5383 Other fatigue: Secondary | ICD-10-CM

## 2020-03-25 DIAGNOSIS — E1165 Type 2 diabetes mellitus with hyperglycemia: Secondary | ICD-10-CM

## 2020-03-25 DIAGNOSIS — R631 Polydipsia: Secondary | ICD-10-CM

## 2020-03-25 DIAGNOSIS — R739 Hyperglycemia, unspecified: Secondary | ICD-10-CM | POA: Diagnosis not present

## 2020-03-25 DIAGNOSIS — R059 Cough, unspecified: Secondary | ICD-10-CM

## 2020-03-25 DIAGNOSIS — B349 Viral infection, unspecified: Secondary | ICD-10-CM | POA: Diagnosis not present

## 2020-03-25 DIAGNOSIS — R350 Frequency of micturition: Secondary | ICD-10-CM

## 2020-03-25 LAB — POCT FASTING CBG KUC MANUAL ENTRY: POCT Glucose (KUC): 229 mg/dL — AB (ref 70–99)

## 2020-03-25 LAB — POCT URINALYSIS DIP (MANUAL ENTRY)
Bilirubin, UA: NEGATIVE
Glucose, UA: NEGATIVE mg/dL
Ketones, POC UA: NEGATIVE mg/dL
Nitrite, UA: NEGATIVE
Protein Ur, POC: 100 mg/dL — AB
Spec Grav, UA: 1.015 (ref 1.010–1.025)
Urobilinogen, UA: 0.2 E.U./dL
pH, UA: 5.5 (ref 5.0–8.0)

## 2020-03-25 MED ORDER — NYSTATIN 100000 UNIT/ML MT SUSP: 500000.0000 [IU] | Freq: Four times a day (QID) | OROMUCOSAL | 0 refills | Status: DC

## 2020-03-25 NOTE — Discharge Instructions (Addendum)
Bloodwork pending. We will be in touch with any abnormal results. Stay well hydrated.  I have sent in nystatin for you to use as a mouth rinse four times per day for thrush  Your COVID and Flu tests are pending.  You should self quarantine until the test results are back.    Take Tylenol or ibuprofen as needed for fever or discomfort.  Rest and keep yourself hydrated.    Follow-up with your primary care provider if your symptoms are not improving.

## 2020-03-25 NOTE — ED Triage Notes (Signed)
Pt has white coating on tongue since 03/23/2020.  shortly after she started coughing up white mucous , wheezing, fatigue and increased thirst.

## 2020-03-26 LAB — COMPREHENSIVE METABOLIC PANEL
ALT: 15 IU/L (ref 0–32)
AST: 28 IU/L (ref 0–40)
Albumin/Globulin Ratio: 1.4 (ref 1.2–2.2)
Albumin: 4 g/dL (ref 3.8–4.8)
Alkaline Phosphatase: 75 IU/L (ref 44–121)
BUN/Creatinine Ratio: 12 (ref 12–28)
BUN: 10 mg/dL (ref 8–27)
Bilirubin Total: 0.7 mg/dL (ref 0.0–1.2)
CO2: 24 mmol/L (ref 20–29)
Calcium: 8.9 mg/dL (ref 8.7–10.3)
Chloride: 98 mmol/L (ref 96–106)
Creatinine, Ser: 0.81 mg/dL (ref 0.57–1.00)
GFR calc Af Amer: 91 mL/min/{1.73_m2} (ref >59)
GFR calc non Af Amer: 79 mL/min/{1.73_m2} (ref >59)
Globulin, Total: 2.8 g/dL (ref 1.5–4.5)
Glucose: 227 mg/dL — ABNORMAL HIGH (ref 65–99)
Potassium: 4.4 mmol/L (ref 3.5–5.2)
Sodium: 135 mmol/L (ref 134–144)
Total Protein: 6.8 g/dL (ref 6.0–8.5)

## 2020-03-26 LAB — CBC WITH DIFFERENTIAL/PLATELET
Basophils Absolute: 0 10*3/uL (ref 0.0–0.2)
Basos: 0 %
EOS (ABSOLUTE): 0 10*3/uL (ref 0.0–0.4)
Eos: 0 %
Hematocrit: 41.7 % (ref 34.0–46.6)
Hemoglobin: 14.2 g/dL (ref 11.1–15.9)
Immature Grans (Abs): 0 10*3/uL (ref 0.0–0.1)
Immature Granulocytes: 0 %
Lymphocytes Absolute: 1.4 10*3/uL (ref 0.7–3.1)
Lymphs: 46 %
MCH: 30.1 pg (ref 26.6–33.0)
MCHC: 34.1 g/dL (ref 31.5–35.7)
MCV: 88 fL (ref 79–97)
Monocytes Absolute: 0.4 10*3/uL (ref 0.1–0.9)
Monocytes: 13 %
Neutrophils Absolute: 1.3 10*3/uL — ABNORMAL LOW (ref 1.4–7.0)
Neutrophils: 41 %
Platelets: 125 10*3/uL — ABNORMAL LOW (ref 150–450)
RBC: 4.72 x10E6/uL (ref 3.77–5.28)
RDW: 12.4 % (ref 11.7–15.4)
WBC: 3.2 10*3/uL — ABNORMAL LOW (ref 3.4–10.8)

## 2020-03-26 LAB — CK: Total CK: 78 U/L (ref 32–182)

## 2020-03-27 LAB — COVID-19, FLU A+B NAA
Influenza A, NAA: NOT DETECTED
Influenza B, NAA: NOT DETECTED
SARS-CoV-2, NAA: DETECTED — AB

## 2020-03-27 NOTE — ED Provider Notes (Signed)
[facility]    CSN: [id number] Arrival date & time: 03/25/20  1600      History   Chief Complaint Chief Complaint  Patient presents with  . Thrush    HPI Megan Salazar is a 60 y.o. female.   Reports that she has been coughing, wheezing, fatigued for the last 2 days. Also reports that she has been experiencing increased thirst and urinary frequency, as well as diarrhea over the last 2 days. Reports that she has been having a white coating on her tongue as well for the last 2 days. Patient has history of type 2 diabetes, does not take prescription medications for this, does not check blood sugars at home, takes oral cinnamon for this. There do not seem to be aggravating or alleviating factors. She has not attempted OTC treatment for this. Denies headache, nausea, vomiting, rash, fever, other symptoms.  Ros per HPI  The history is provided by the patient.    Past Medical History:  Diagnosis Date  . Asthma   . Bone spur   . Diabetes ([facility])   . IBS (irritable bowel syndrome)    chronic diarrhea  . Paresthesias    left side  . Sciatica     Patient Active Problem List   Diagnosis Date Noted  . Hepatic cirrhosis ([facility]) 01/19/2018  . Colon cancer screening 01/19/2018  . Chronic diarrhea 12/31/2011    Past Surgical History:  Procedure Laterality Date  . ABDOMINAL HYSTERECTOMY     partial  . CARPAL TUNNEL RELEASE     bilat, [place]  . CESAREAN SECTION    . COLONOSCOPY  07/18/2010   Fields-SIMPLE ADENOMA (Next colonoscopy  06/2020)    OB History   No obstetric history on file.      Home Medications    Prior to Admission medications   Medication Sig Start Date End Date Taking? Authorizing Provider  nystatin (MYCOSTATIN) 100000 UNIT/ML suspension Take 5 mLs (500,000 Units total) by mouth 4 (four) times daily. 03/25/20  Yes Moshe Cipro, NP  Alogliptin Benzoate (NESINA) 25 MG TABS Take 25 mg by mouth daily.    [provider]   CALCIUM-MAGNESIUM-VITAMIN D PO Take by mouth daily.    [provider]  Charcoal Activated 260 MG CAPS Take 260 mg by mouth once a week.    [provider]  CINNAMON PO Take by mouth daily.    [provider]  Coenzyme Q10 (COQ10) 200 MG CAPS Take by mouth daily.    [provider]  Flaxseed, Linseed, (FLAXSEED OIL PO) Take by mouth daily.    [provider]  fluticasone (FLOVENT HFA) 110 MCG/ACT inhaler Inhale 1 puff into the lungs 2 (two) times daily as needed.     [provider]  lisinopril (ZESTRIL) 10 MG tablet Take 10 mg by mouth daily. 09/11/19   [provider]  Misc Natural Products (TART CHERRY ADVANCED PO) Take by mouth daily.    [provider]  montelukast (SINGULAIR) 10 MG tablet Take 10 mg by mouth at bedtime as needed.     [provider]  OLIVE LEAF PO Take by mouth. 3 times per week    [provider]  Olopatadine HCl (PATADAY OP) Apply to eye as needed.    [provider]  ondansetron (ZOFRAN ODT) 4 MG disintegrating tablet Take 1 tablet (4 mg total) by mouth every 8 (eight) hours as needed for nausea or vomiting. 10/02/19   Tanda Rockers, PA-C  TURMERIC PO Take by mouth daily.    [provider]    Family History Family History  Problem Relation Age of Onset  . Coronary artery disease Father   . Diabetes Father   . Brain cancer Brother   . Diverticulitis Sister   . Colon cancer Neg Hx   . Colon polyps Neg Hx     Social History Social History   Tobacco Use  . Smoking status: Never Smoker  . Smokeless tobacco: Never Used  Vaping Use  . Vaping Use: Former  Substance Use Topics  . Alcohol use: Yes    Comment: occas  . Drug use: No     Allergies   Equal [aspartame], Codeine, Amoxicillin, Soy allergy, and Sulfa drugs cross reactors   Review of Systems Review of Systems   Physical Exam Triage Vital Signs ED Triage Vitals  Enc Vitals Group      BP 03/25/20 1637 (!) 154/84     Pulse Rate 03/25/20 1637 79     Resp 03/25/20 1637 16     Temp 03/25/20 1637 98.8 F (37.1 C)     Temp Source 03/25/20 1637 Oral     SpO2 03/25/20 1637 95 %     Weight 03/25/20 1651 215 lb (97.5 kg)     Height 03/25/20 1651 5\' 4"  (1.626 m)     Head Circumference --      Peak Flow --      Pain Score 03/25/20 1651 0     Pain Loc --      Pain Edu? --      Excl. in [place]? --    No data found.  Updated Vital Signs BP (!) 154/84 (BP Location: Right Arm)   Pulse 79   Temp 98.8 F (37.1 C) (Oral)   Resp 16   Ht 5\' 4"  (1.626 m)   Wt 215 lb (97.5 kg)   SpO2 95%   BMI 36.90 kg/m   Visual Acuity Right Eye Distance:   Left Eye Distance:   Bilateral Distance:    Right Eye Near:   Left Eye Near:    Bilateral Near:     Physical Exam Vitals and nursing note reviewed.  Constitutional:      General: She is not in acute distress.    Appearance: She is well-developed and well-nourished. She is obese.  HENT:     Head: Normocephalic and atraumatic.     Right Ear: Tympanic membrane normal.     Left Ear: Tympanic membrane normal.     Nose: Nose normal.     Mouth/Throat:     Mouth: Mucous membranes are moist.     Pharynx: Oropharynx is clear.     Comments: White coating present to most of the tongue. Eyes:     Extraocular Movements: Extraocular movements intact.     Conjunctiva/sclera: Conjunctivae normal.     Pupils: Pupils are equal, round, and reactive to light.  Cardiovascular:     Rate and Rhythm: Normal rate and regular rhythm.     Heart sounds: No murmur heard.   Pulmonary:     Effort: Pulmonary effort is normal. No respiratory distress.     Breath sounds: Normal breath sounds.  Abdominal:     Palpations: Abdomen is soft.     Tenderness: There is no abdominal tenderness.  Musculoskeletal:        General: No edema.     Cervical back: Neck supple.  Skin:    General: Skin is warm  and dry.  Neurological:     Mental Status: She is alert.   Psychiatric:        Mood and Affect: Mood and affect normal.      UC Treatments / Results  Labs (all labs ordered are listed, but only abnormal results are displayed) Labs Reviewed  COVID-19, FLU A+B NAA - Abnormal; Notable for the following components:      Result Value   SARS-CoV-2, NAA Detected (*)    All other components within normal limits   Narrative:    Test(s) 140142-Influenza A, NAA; 140143-Influenza B, NAA was developed and its performance characteristics determined by [facility]. It has not been cleared or approved by the Food and Drug Administration. Performed at:  [facility] [address] [place], [place]  [id number] Lab Director: Rush Farmer MD, Phone:  [phone]  CBC WITH DIFFERENTIAL/PLATELET - Abnormal; Notable for the following components:   WBC 3.2 (*)    Platelets 125 (*)    Neutrophils Absolute 1.3 (*)    All other components within normal limits   Narrative:    Performed at:  [address] [place], [place]  [id number] Lab Director: Rush Farmer MD, Phone:  [phone]  COMPREHENSIVE METABOLIC PANEL - Abnormal; Notable for the following components:   Glucose 227 (*)    All other components within normal limits   Narrative:    Performed at:  [address] [place], [place]  [id number] Lab Director: Rush Farmer MD, Phone:  [phone]  POCT FASTING CBG [place] - Abnormal; Notable for the following components:   POCT Glucose ([facility]) 229 (*)    All other components within normal limits  POCT URINALYSIS DIP (MANUAL ENTRY) - Abnormal; Notable for the following components:   Blood, UA trace-intact (*)    Protein Ur, POC =100 (*)    Leukocytes, UA Trace (*)    All other components within normal limits  CK   Narrative:    Performed at:  [address] [place], [place]  [id number] Lab Director: Rush Farmer MD, Phone:  [phone]     EKG   Radiology No results found.  Procedures Procedures (including critical care time)  Medications Ordered in UC Medications - No data to display  Initial Impression / Assessment and Plan / UC Course  I have reviewed the triage vital signs and the nursing notes.  Pertinent labs & imaging results that were available during my care of the patient were reviewed by me and considered in my medical decision making (see chart for details).  Clinical Course as of 03/27/20 2147  Mon Mar 25, 2020  1724 [place](!): =100 [SM]    Clinical Course User Index [SM] Faustino Congress, NP    Type 2 Diabetes with hyperglycemia Cough Diarrhea Oral Thrush Polydipsia Urinary frequency Viral illness Proteinuria  Suspect that increased thirst and urination, as well as thrush are coming from hyperglycemia Suggested follow up with PCP for hyperglycemia, likely needs to start on oral medication therapy for diabetes UA shows proteinuria, will check blood work for infection, kidney function Suspect that hyperglycemia is stemming from underlying viral infection Covid and flu tests pending Will follow up with abnormal results Quarantine until results are back and negative If positive, remain in isolation for 5 days from today, then follow up with PCP to determine if longer isolation is necessary Follow up with this office or with PCP as needed Follow up in the  ER for acute worsening symptoms, trouble swallowing, trouble breathing, other concerning symptoms Verbalized understanding and agreed with treatment plan  Final Clinical Impressions(s) / UC Diagnoses   Final diagnoses:  Cough  Hyperglycemia  Diarrhea, unspecified type  Dizziness and giddiness  Thrush, oral  Viral illness  Proteinuria, unspecified type  Other fatigue  Type 2 diabetes mellitus with hyperglycemia, without long-term current use of insulin (HCC)  Polydipsia  Urinary frequency     Discharge Instructions      Bloodwork pending. We will be in touch with any abnormal results. Stay well hydrated.  I have sent in nystatin for you to use as a mouth rinse four times per day for thrush  Your COVID and Flu tests are pending.  You should self quarantine until the test results are back.    Take Tylenol or ibuprofen as needed for fever or discomfort.  Rest and keep yourself hydrated.    Follow-up with your primary care provider if your symptoms are not improving.       ED Prescriptions    Medication Sig Dispense Auth. Provider   nystatin (MYCOSTATIN) 100000 UNIT/ML suspension Take 5 mLs (500,000 Units total) by mouth 4 (four) times daily. 60 mL Faustino Congress, NP     PDMP not reviewed this encounter.   Faustino Congress, NP 03/27/20 2147

## 2020-03-30 ENCOUNTER — Encounter (HOSPITAL_COMMUNITY): Payer: Self-pay | Admitting: Pharmacy Technician

## 2020-03-30 ENCOUNTER — Emergency Department (HOSPITAL_COMMUNITY): Payer: Medicaid Other

## 2020-03-30 ENCOUNTER — Inpatient Hospital Stay (HOSPITAL_COMMUNITY)
Admission: EM | Admit: 2020-03-30 | Discharge: 2020-04-03 | DRG: 177 | Disposition: A | Discharge status: Home / Self Care | Payer: Medicaid Other | Attending: Internal Medicine | Admitting: Internal Medicine

## 2020-03-30 ENCOUNTER — Ambulatory Visit
Admission: EM | Admit: 2020-03-30 | Discharge: 2020-03-30 | Disposition: A | Discharge status: Home / Self Care | Payer: Medicaid Other

## 2020-03-30 ENCOUNTER — Other Ambulatory Visit: Payer: Self-pay

## 2020-03-30 DIAGNOSIS — J9601 Acute respiratory failure with hypoxia: Secondary | ICD-10-CM | POA: Diagnosis present

## 2020-03-30 DIAGNOSIS — K580 Irritable bowel syndrome with diarrhea: Secondary | ICD-10-CM | POA: Diagnosis present

## 2020-03-30 DIAGNOSIS — Z283 Underimmunization status: Secondary | ICD-10-CM | POA: Diagnosis not present

## 2020-03-30 DIAGNOSIS — E119 Type 2 diabetes mellitus without complications: Secondary | ICD-10-CM

## 2020-03-30 DIAGNOSIS — Z882 Allergy status to sulfonamides status: Secondary | ICD-10-CM | POA: Diagnosis not present

## 2020-03-30 DIAGNOSIS — Z6836 Body mass index (BMI) 36.0-36.9, adult: Secondary | ICD-10-CM

## 2020-03-30 DIAGNOSIS — J1282 Pneumonia due to coronavirus disease 2019: Principal | ICD-10-CM | POA: Diagnosis present

## 2020-03-30 DIAGNOSIS — Z833 Family history of diabetes mellitus: Secondary | ICD-10-CM | POA: Diagnosis not present

## 2020-03-30 DIAGNOSIS — Z885 Allergy status to narcotic agent status: Secondary | ICD-10-CM

## 2020-03-30 DIAGNOSIS — I10 Essential (primary) hypertension: Secondary | ICD-10-CM | POA: Diagnosis present

## 2020-03-30 DIAGNOSIS — E1165 Type 2 diabetes mellitus with hyperglycemia: Secondary | ICD-10-CM | POA: Diagnosis present

## 2020-03-30 DIAGNOSIS — Z808 Family history of malignant neoplasm of other organs or systems: Secondary | ICD-10-CM

## 2020-03-30 DIAGNOSIS — Z8249 Family history of ischemic heart disease and other diseases of the circulatory system: Secondary | ICD-10-CM | POA: Diagnosis not present

## 2020-03-30 DIAGNOSIS — Z79899 Other long term (current) drug therapy: Secondary | ICD-10-CM

## 2020-03-30 DIAGNOSIS — B370 Candidal stomatitis: Secondary | ICD-10-CM | POA: Diagnosis present

## 2020-03-30 DIAGNOSIS — J45909 Unspecified asthma, uncomplicated: Secondary | ICD-10-CM | POA: Diagnosis present

## 2020-03-30 DIAGNOSIS — U071 COVID-19: Principal | ICD-10-CM | POA: Diagnosis present

## 2020-03-30 DIAGNOSIS — E669 Obesity, unspecified: Secondary | ICD-10-CM | POA: Diagnosis present

## 2020-03-30 DIAGNOSIS — Z880 Allergy status to penicillin: Secondary | ICD-10-CM | POA: Diagnosis not present

## 2020-03-30 DIAGNOSIS — K7460 Unspecified cirrhosis of liver: Secondary | ICD-10-CM | POA: Diagnosis present

## 2020-03-30 DIAGNOSIS — D696 Thrombocytopenia, unspecified: Secondary | ICD-10-CM | POA: Diagnosis present

## 2020-03-30 DIAGNOSIS — Z888 Allergy status to other drugs, medicaments and biological substances status: Secondary | ICD-10-CM | POA: Diagnosis not present

## 2020-03-30 HISTORY — DX: Essential (primary) hypertension: I10

## 2020-03-30 LAB — CBC
HCT: 43.8 % (ref 36.0–46.0)
Hemoglobin: 14.2 g/dL (ref 12.0–15.0)
MCH: 29.4 pg (ref 26.0–34.0)
MCHC: 32.4 g/dL (ref 30.0–36.0)
MCV: 90.7 fL (ref 80.0–100.0)
Platelets: 201 10*3/uL (ref 150–400)
RBC: 4.83 MIL/uL (ref 3.87–5.11)
RDW: 12.4 % (ref 11.5–15.5)
WBC: 8.5 10*3/uL (ref 4.0–10.5)
nRBC: 0 % (ref 0.0–0.2)

## 2020-03-30 LAB — C-REACTIVE PROTEIN: CRP: 2 mg/dL — ABNORMAL HIGH (ref <1.0)

## 2020-03-30 LAB — D-DIMER, QUANTITATIVE: D-Dimer, Quant: 1.18 ug/mL-FEU — ABNORMAL HIGH (ref 0.00–0.50)

## 2020-03-30 LAB — BASIC METABOLIC PANEL
Anion gap: 12 (ref 5–15)
BUN: 13 mg/dL (ref 8–23)
CO2: 27 mmol/L (ref 22–32)
Calcium: 8.9 mg/dL (ref 8.9–10.3)
Chloride: 97 mmol/L — ABNORMAL LOW (ref 98–111)
Creatinine, Ser: 0.77 mg/dL (ref 0.44–1.00)
GFR, Estimated: >60 mL/min (ref >60)
Glucose, Bld: 237 mg/dL — ABNORMAL HIGH (ref 70–99)
Potassium: 4.1 mmol/L (ref 3.5–5.1)
Sodium: 136 mmol/L (ref 135–145)

## 2020-03-30 LAB — CBG MONITORING, ED: Glucose-Capillary: 192 mg/dL — ABNORMAL HIGH (ref 70–99)

## 2020-03-30 LAB — PROCALCITONIN: Procalcitonin: <0.1 ng/mL

## 2020-03-30 LAB — FIBRINOGEN: Fibrinogen: 531 mg/dL — ABNORMAL HIGH (ref 210–475)

## 2020-03-30 LAB — BRAIN NATRIURETIC PEPTIDE: B Natriuretic Peptide: 15.5 pg/mL (ref 0.0–100.0)

## 2020-03-30 LAB — FERRITIN: Ferritin: 548 ng/mL — ABNORMAL HIGH (ref 11–307)

## 2020-03-30 LAB — LACTATE DEHYDROGENASE: LDH: 347 U/L — ABNORMAL HIGH (ref 98–192)

## 2020-03-30 LAB — HIV ANTIBODY (ROUTINE TESTING W REFLEX): HIV Screen 4th Generation wRfx: NONREACTIVE

## 2020-03-30 MED ORDER — ONDANSETRON HCL 4 MG PO TABS: 4.0000 mg | ORAL_TABLET | Freq: Four times a day (QID) | ORAL | Status: DC | PRN

## 2020-03-30 MED ORDER — BUDESONIDE 0.5 MG/2ML IN SUSP
0.5000 mg | Freq: Two times a day (BID) | RESPIRATORY_TRACT | Status: DC
  Administered 2020-03-31: 0.5 mg via RESPIRATORY_TRACT
  Filled 2020-03-30 (×2): qty 2

## 2020-03-30 MED ORDER — ACETAMINOPHEN 325 MG PO TABS: 650.0000 mg | ORAL_TABLET | Freq: Four times a day (QID) | ORAL | Status: DC | PRN

## 2020-03-30 MED ORDER — DEXAMETHASONE SODIUM PHOSPHATE 10 MG/ML IJ SOLN
10.0000 mg | Freq: Once | INTRAMUSCULAR | Status: AC
  Administered 2020-03-30: 10 mg via INTRAVENOUS
  Filled 2020-03-30: qty 1

## 2020-03-30 MED ORDER — ALBUTEROL SULFATE HFA 108 (90 BASE) MCG/ACT IN AERS
2.0000 | INHALATION_SPRAY | RESPIRATORY_TRACT | Status: DC | PRN
  Administered 2020-03-30 – 2020-03-31 (×2): 2 via RESPIRATORY_TRACT
  Filled 2020-03-30: qty 6.7

## 2020-03-30 MED ORDER — NYSTATIN 100000 UNIT/ML MT SUSP
5.0000 mL | Freq: Four times a day (QID) | OROMUCOSAL | Status: DC
  Administered 2020-03-31 – 2020-04-03 (×12): 500000 [IU] via ORAL
  Filled 2020-03-30 (×17): qty 5

## 2020-03-30 MED ORDER — MONTELUKAST SODIUM 10 MG PO TABS
10.0000 mg | ORAL_TABLET | Freq: Every day | ORAL | Status: DC
  Administered 2020-03-31 – 2020-04-02 (×3): 10 mg via ORAL
  Filled 2020-03-30 (×4): qty 1

## 2020-03-30 MED ORDER — ENOXAPARIN SODIUM 40 MG/0.4ML ~~LOC~~ SOLN
40.0000 mg | SUBCUTANEOUS | Status: DC
  Administered 2020-03-30 – 2020-04-02 (×4): 40 mg via SUBCUTANEOUS
  Filled 2020-03-30 (×4): qty 0.4

## 2020-03-30 MED ORDER — SODIUM CHLORIDE 0.9 % IV SOLN
200.0000 mg | Freq: Once | INTRAVENOUS | Status: AC
  Administered 2020-03-30: 200 mg via INTRAVENOUS
  Filled 2020-03-30: qty 40

## 2020-03-30 MED ORDER — AEROCHAMBER PLUS FLO-VU LARGE MISC
Status: AC
  Administered 2020-03-30: 1
  Filled 2020-03-30: qty 1

## 2020-03-30 MED ORDER — ONDANSETRON HCL 4 MG/2ML IJ SOLN: 4.0000 mg | Freq: Four times a day (QID) | INTRAMUSCULAR | Status: DC | PRN

## 2020-03-30 MED ORDER — DEXAMETHASONE 4 MG PO TABS
6.0000 mg | ORAL_TABLET | ORAL | Status: DC
Start: 2020-03-31 — End: 2020-03-31
  Filled 2020-03-30: qty 2

## 2020-03-30 MED ORDER — SENNOSIDES-DOCUSATE SODIUM 8.6-50 MG PO TABS: 1.0000 | ORAL_TABLET | Freq: Every evening | ORAL | Status: DC | PRN

## 2020-03-30 MED ORDER — SODIUM CHLORIDE 0.9 % IV SOLN
100.0000 mg | Freq: Every day | INTRAVENOUS | Status: AC
  Administered 2020-03-31 – 2020-04-03 (×4): 100 mg via INTRAVENOUS
  Filled 2020-03-30 (×3): qty 20
  Filled 2020-03-30 (×2): qty 2.5

## 2020-03-30 MED ORDER — INSULIN ASPART 100 UNIT/ML ~~LOC~~ SOLN
0.0000 [IU] | Freq: Three times a day (TID) | SUBCUTANEOUS | Status: DC
  Administered 2020-03-31: 3 [IU] via SUBCUTANEOUS

## 2020-03-30 NOTE — ED Triage Notes (Signed)
Pt bib ems from UC with new oxygen requirement. Pt dx with covid on Monday and states she continues to feel worse. Pt reports weakness, shob and feeling tired. Pt in NAD. Arrives on 4L [place].  170/89 HR 68 96% 4L [place]

## 2020-03-30 NOTE — ED Provider Notes (Signed)
[facility]ERGENC[facility] DEPARTMENT Provider Note   CSN: [id number] Arrival date & time: 03/30/20  1235     History Chief Complaint  Patient presents with  . Covid Positive    Megan Salazar is a 60 y.o. female.  60 yo F with a chief complaints of cough congestion fevers chills myalgias shortness of breath.  Worsening over the past week.  Family was also sick and tested positive for the coronavirus.  She has been using her asthma medications much more often than normal.  Has been eating and drinking somewhat less.  Started with sinus symptoms and has progressed into more difficulty breathing.  The history is provided by the patient.  Illness Severity:  Moderate Onset quality:  Gradual Duration:  1 week Timing:  Constant Progression:  Worsening Chronicity:  New Associated symptoms: congestion, cough, fever and myalgias   Associated symptoms: no chest pain, no headaches, no nausea, no rhinorrhea, no shortness of breath, no vomiting and no wheezing        Past Medical History:  Diagnosis Date  . Asthma   . Bone spur   . Diabetes ([place])   . IBS (irritable bowel syndrome)    chronic diarrhea  . Paresthesias    left side  . Sciatica     Patient Active Problem List   Diagnosis Date Noted  . Hepatic cirrhosis ([place]) 01/19/2018  . Colon cancer screening 01/19/2018  . Chronic diarrhea 12/31/2011    Past Surgical History:  Procedure Laterality Date  . ABDOMINAL HYSTERECTOMY     partial  . CARPAL TUNNEL RELEASE     bilat, [place]  . CESAREAN SECTION    . COLONOSCOPY  07/18/2010   Fields-SIMPLE ADENOMA (Next colonoscopy  06/2020)     OB History   No obstetric history on file.     Family History  Problem Relation Age of Onset  . Coronary artery disease Father   . Diabetes Father   . Brain cancer Brother   . Diverticulitis Sister   . Colon cancer Neg Hx   . Colon polyps Neg Hx     Social History   Tobacco Use  . Smoking status: Never  Smoker  . Smokeless tobacco: Never Used  Vaping Use  . Vaping Use: Former  Substance Use Topics  . Alcohol use: Yes    Comment: occas  . Drug use: No    Home Medications Prior to Admission medications   Medication Sig Start Date End Date Taking? Authorizing Provider  Alogliptin Benzoate (NESINA) 25 MG TABS Take 25 mg by mouth daily.    [provider]  CALCIUM-MAGNESIUM-VITAMIN D PO Take by mouth daily.    [provider]  Charcoal Activated 260 MG CAPS Take 260 mg by mouth once a week.    [provider]  CINNAMON PO Take by mouth daily.    [provider]  Coenzyme Q10 (COQ10) 200 MG CAPS Take by mouth daily.    [provider]  Flaxseed, Linseed, (FLAXSEED OIL PO) Take by mouth daily.    [provider]  fluticasone (FLOVENT HFA) 110 MCG/ACT inhaler Inhale 1 puff into the lungs 2 (two) times daily as needed.     [provider]  lisinopril (ZESTRIL) 10 MG tablet Take 10 mg by mouth daily. 09/11/19   [provider]  Misc Natural Products (TART CHERRY ADVANCED PO) Take by mouth daily.    [provider]  montelukast (SINGULAIR) 10 MG tablet Take 10 mg  by mouth at bedtime as needed.     [provider]  nystatin (MYCOSTATIN) 100000 UNIT/ML suspension Take 5 mLs (500,000 Units total) by mouth 4 (four) times daily. 03/25/20   Faustino Congress, NP  OLIVE LEAF PO Take by mouth. 3 times per week    [provider]  Olopatadine HCl (PATADAY OP) Apply to eye as needed.    [provider]  ondansetron (ZOFRAN ODT) 4 MG disintegrating tablet Take 1 tablet (4 mg total) by mouth every 8 (eight) hours as needed for nausea or vomiting. 10/02/19   Eustaquio Maize, PA-C  TURMERIC PO Take by mouth daily.    [provider]    Allergies    Equal [aspartame], Codeine, Amoxicillin, Soy allergy, and Sulfa drugs cross reactors  Review of Systems   Review of Systems  Constitutional:  Positive for chills and fever.  HENT: Positive for congestion. Negative for rhinorrhea.   Eyes: Negative for redness and visual disturbance.  Respiratory: Positive for cough. Negative for shortness of breath and wheezing.   Cardiovascular: Negative for chest pain and palpitations.  Gastrointestinal: Negative for nausea and vomiting.  Genitourinary: Negative for dysuria and urgency.  Musculoskeletal: Positive for myalgias. Negative for arthralgias.  Skin: Negative for pallor and wound.  Neurological: Negative for dizziness and headaches.    Physical Exam Updated Vital Signs BP (!) 145/90   Pulse 63   Temp 98.4 F (36.9 C) (Oral)   Resp 20   SpO2 100%   Physical Exam Vitals and nursing note reviewed.  Constitutional:      General: She is not in acute distress.    Appearance: She is well-developed and well-nourished. She is not diaphoretic.  HENT:     Head: Normocephalic and atraumatic.  Eyes:     Extraocular Movements: EOM normal.     Pupils: Pupils are equal, round, and reactive to light.  Cardiovascular:     Rate and Rhythm: Normal rate and regular rhythm.     Heart sounds: No murmur heard. No friction rub. No gallop.   Pulmonary:     Effort: Pulmonary effort is normal.     Breath sounds: No wheezing or rales.  Abdominal:     General: There is no distension.     Palpations: Abdomen is soft.     Tenderness: There is no abdominal tenderness.  Musculoskeletal:        General: No tenderness or edema.     Cervical back: Normal range of motion and neck supple.  Skin:    General: Skin is warm and dry.  Neurological:     Mental Status: She is alert and oriented to person, place, and time.  Psychiatric:        Mood and Affect: Mood and affect normal.        Behavior: Behavior normal.     ED Results / Procedures / Treatments   Labs (all labs ordered are listed, but only abnormal results are displayed) Labs Reviewed  BASIC METABOLIC PANEL - Abnormal; Notable for the  following components:      Result Value   Chloride 97 (*)    Glucose, Bld 237 (*)    All other components within normal limits  CBC    EKG None  Radiology DG Chest Portable 1 View  Result Date: 03/30/2020 CLINICAL DATA:  COVID-19 positive.  Shortness of breath. EXAM: PORTABLE CHEST 1 VIEW COMPARISON:  None. FINDINGS: Cardiomediastinal silhouette is normal. No pneumothorax. Mild opacity seen in the left base.  Minimal opacity not excluded in the right base. IMPRESSION: Mild opacity in the left base and possible minimal opacity in the right base. Findings worrisome for pneumonia given history. Recommend attention on follow-up. Electronically Signed   By: Dorise Bullion III M.D   On: 03/30/2020 13:46    Procedures Procedures (including critical care time)  Medications Ordered in ED Medications  albuterol (VENTOLIN HFA) 108 (90 Base) MCG/ACT inhaler 2 puff (has no administration in time range)  dexamethasone (DECADRON) injection 10 mg (has no administration in time range)    ED Course  I have reviewed the triage vital signs and the nursing notes.  Pertinent labs & imaging results that were available during my care of the patient were reviewed by me and considered in my medical decision making (see chart for details).    MDM Rules/Calculators/A&P                          60 yo F with a chief complaints of cough congestion fevers chills myalgias going on for the past week.  Patient having worsening difficulty breathing ended up coming to the hospital today.  Found to have oxygen saturation in the low 80s at urgent care and then was sent over here.  Requiring 4 L of oxygen.  No significant anemia no significant electrolyte abnormality.  Covid positive.  Chest x-ray with atypical infiltrates.  Will discuss with medicine.  CRITICAL CARE Performed by: Cecilio Asper   Total critical care time: 35 minutes  Critical care time was exclusive of separately billable procedures and  treating other patients.  Critical care was necessary to treat or prevent imminent or life-threatening deterioration.  Critical care was time spent personally by me on the following activities: development of treatment plan with patient and/or surrogate as well as nursing, discussions with consultants, evaluation of patient's response to treatment, examination of patient, obtaining history from patient or surrogate, ordering and performing treatments and interventions, ordering and review of laboratory studies, ordering and review of radiographic studies, pulse oximetry and re-evaluation of patient's condition.  Michellee A Berkey was evaluated in Emergency Department on 03/30/2020 for the symptoms described in the history of present illness. He/she was evaluated in the context of the global COVID-19 pandemic, which necessitated consideration that the patient might be at risk for infection with the SARS-CoV-2 virus that causes COVID-19. Institutional protocols and algorithms that pertain to the evaluation of patients at risk for COVID-19 are in a state of rapid change based on information released by regulatory bodies including the CDC and federal and state organizations. These policies and algorithms were followed during the patient's care in the ED.  The patients results and plan were reviewed and discussed.   Any x-rays performed were independently reviewed by myself.   Differential diagnosis were considered with the presenting HPI.  Medications  albuterol (VENTOLIN HFA) 108 (90 Base) MCG/ACT inhaler 2 puff (has no administration in time range)  dexamethasone (DECADRON) injection 10 mg (has no administration in time range)    Vitals:   03/30/20 1256 03/30/20 1632 03/30/20 1737  BP: (!) 156/102 (!) 151/72 (!) 145/90  Pulse: 72 68 63  Resp: 18 18 20   Temp: 98.5 F (36.9 C) 98.4 F (36.9 C)   TempSrc: Oral Oral   SpO2: (!) 87% 99% 100%    Final diagnoses:  Pneumonia due to COVID-19  virus  Acute respiratory failure with hypoxia (HCC)    Admission/ observation were discussed  with the admitting physician, patient and/or family and they are comfortable with the plan.    Final Clinical Impression(s) / ED Diagnoses Final diagnoses:  Pneumonia due to COVID-19 virus  Acute respiratory failure with hypoxia [facility])    Rx / DC Orders ED Discharge Orders    None       Deno Etienne, DO 03/30/20 1807

## 2020-03-30 NOTE — ED Triage Notes (Signed)
Pt placed on 4 l increased to 90% Patient is being discharged from the Urgent Care and sent to the Emergency Department via ems . Per Kirkland Hun patient is in need of higher level of care due to hypoxia . Patient is aware and verbalizes understanding of plan of care. There were no vitals filed for this visit.

## 2020-03-30 NOTE — H&P (Signed)
History and Physical    Megan Salazar [id number] DOB: [birth year] DOA: 03/30/2020  PCP: Rosita Fire, MD  Patient coming from: home Chief Complaint: shortness of breath wheezing  HPI: Megan Salazar is a 60 y.o. female with a history of type 2 diabetes, IBS-D reactive airway disease and inhalers who presents to the emergency department with shortness of breath, cough, congestion, fever and chills, myalgias that has been worsening over the past week.  She has had family this been diagnosed with COVID   She endorses adherence to her inhalers. ICS and rare albuterol use it seems. She thinks that new years eve she went swimming that she does frequently and the next day started developing symptoms that worsened to where she proceeded to [place] on 3 January where she was found to be hyperglycemic with oral thrush and was suggested to follow-up with PCP.    She was started on nystatin swish and swallow and thinks it has improved a good bit.   In the emergency department patient initially arrived with a oxygen saturation of 87% on room air which improved to 98% on 3 L nasal oxygen, RR 25, 156/102 which down trended to 135/77, glucose was elevated to 237 and was 227 on the third.  NA 136, K4.1, CO2 27, SCR 0.77, calcium 8.9, Hgb 14.2, WBC 8.5, PLT 201 up from 125 on the third.  On the third she was COVID positive and UA showed trace leukocytes and protein but it was felt that her polyuria was due to hyperglycemia.  Patient was started on dexamethasone and remdesivir in the emergency department.  She thinks after the dexamethasone that shee can breathe a little bit better and deeper.  She states she has lots of allergies to things and can't take vaccines really, she doesn't think it is associated with hives.  She thinks she is allergic to most diabetic medications, the metformin made her feel bad and flared up her IBS.  Review of Systems: As per HPI otherwise 10 point review of systems  negative.  Other pertinents as below:  General - denies any new HA's or visual changes HEENT - has osre throat and some wheezing, denies dysphagia, tongue is feeling better Cardio - denies CP, palpitationst Resp - has Sob, some wheezing and has a cough GI - denies n/v/d/GI pain GU - thinks after the steroid perhaps that she had burning in her crotch but doesn't think it is a yeast infection MSK - denies new joint or back pain Skin - denies LE edema, denies new skin changes  Neuro - denies new numbness or weakness Psych - denies new anxiety or depression  Past Medical History:  Diagnosis Date  . Asthma   . Bone spur   . Diabetes ([place])   . IBS (irritable bowel syndrome)    chronic diarrhea  . Paresthesias    left side  . Sciatica     Past Surgical History:  Procedure Laterality Date  . ABDOMINAL HYSTERECTOMY     partial  . CARPAL TUNNEL RELEASE     bilat, [place]  . CESAREAN SECTION    . COLONOSCOPY  07/18/2010   Fields-SIMPLE ADENOMA (Next colonoscopy  06/2020)     reports that she has never smoked. She has never used smokeless tobacco. She reports current alcohol use. She reports that she does not use drugs.  Allergies  Allergen Reactions  . Equal [Aspartame]     PT SAID SHE IS HIGHLY ALLERGIC TO ARTIFICIAL SWEETNERS. GETS DIARRHEA  EXTREMELY BAD AND HIVES.   Marland Kitchen Codeine Nausea And Vomiting    Liquid codeine  . Amoxicillin   . Soy Allergy     All soy products  . Sulfa Drugs Cross Reactors Nausea And Vomiting    Family History  Problem Relation Age of Onset  . Coronary artery disease Father   . Diabetes Father   . Brain cancer Brother   . Diverticulitis Sister   . Colon cancer Neg Hx   . Colon polyps Neg Hx    Prior to Admission medications   Medication Sig Start Date End Date Taking? Authorizing Provider  Alogliptin Benzoate (NESINA) 25 MG TABS Take 25 mg by mouth daily.    [provider]  CALCIUM-MAGNESIUM-VITAMIN D PO Take by mouth daily.     [provider]  Charcoal Activated 260 MG CAPS Take 260 mg by mouth once a week.    [provider]  CINNAMON PO Take by mouth daily.    [provider]  Coenzyme Q10 (COQ10) 200 MG CAPS Take by mouth daily.    [provider]  Flaxseed, Linseed, (FLAXSEED OIL PO) Take by mouth daily.    [provider]  fluticasone (FLOVENT HFA) 110 MCG/ACT inhaler Inhale 1 puff into the lungs 2 (two) times daily as needed.     [provider]  lisinopril (ZESTRIL) 10 MG tablet Take 10 mg by mouth daily. 09/11/19   [provider]  Misc Natural Products (TART CHERRY ADVANCED PO) Take by mouth daily.    [provider]  montelukast (SINGULAIR) 10 MG tablet Take 10 mg by mouth at bedtime as needed.     [provider]  nystatin (MYCOSTATIN) 100000 UNIT/ML suspension Take 5 mLs (500,000 Units total) by mouth 4 (four) times daily. 03/25/20   Faustino Congress, NP  OLIVE LEAF PO Take by mouth. 3 times per week    [provider]  Olopatadine HCl (PATADAY OP) Apply to eye as needed.    [provider]  ondansetron (ZOFRAN ODT) 4 MG disintegrating tablet Take 1 tablet (4 mg total) by mouth every 8 (eight) hours as needed for nausea or vomiting. 10/02/19   Eustaquio Maize, PA-C  TURMERIC PO Take by mouth daily.    [provider]    Physical Exam: Vitals:   03/30/20 1745 03/30/20 1800 03/30/20 1815 03/30/20 1830  BP: (!) 150/87 135/77 131/64 (!) 141/72  Pulse: 71 62 64 71  Resp: (!) 25 18 (!) 29 (!) 29  Temp:      TempSrc:      SpO2: 98% 99% 100% 99%    Constitutional: NAD, comfortable Eyes: pupils equal and reactive to light, anicteric, without injection ENMT: MMM, throat without exudates or erythema Neck: normal, supple, no masses, no thyromegaly noted Respiratory: CTAB, gross wheezing heard, cough noted, increased work of breathing at times with movements. Cardiovascular: rrr, well perfused, warm in  the extremities Abdomen: NBS, NT,   Musculoskeletal: moving all 4 extremities, no LE edema, strength grossly intact Skin: mouth still had a white hue but looked improved Neurologic: CN 2-12 grossly intact. Sensation intact Psychiatric: AO appearing, mentation appropriate  Labs on Admission: I have personally reviewed following labs and imaging studies  CBC: Recent Labs  Lab 03/25/20 1733 03/30/20 1246  WBC 3.2* 8.5  NEUTROABS 1.3*  --   HGB 14.2 14.2  HCT 41.7 43.8  MCV 88 90.7  PLT 125* 993   Basic Metabolic Panel: Recent Labs  Lab 03/25/20  1733 03/30/20 1246  NA 135 136  K 4.4 4.1  CL 98 97*  CO2 24 27  GLUCOSE 227* 237*  BUN 10 13  CREATININE 0.81 0.77  CALCIUM 8.9 8.9   GFR: Estimated Creatinine Clearance: 83.7 mL/min (by C-G formula based on SCr of 0.77 mg/dL). Liver Function Tests: Recent Labs  Lab 03/25/20 1733  AST 28  ALT 15  ALKPHOS 75  BILITOT 0.7  PROT 6.8  ALBUMIN 4.0   No results for input(s): LIPASE, AMYLASE in the last 168 hours. No results for input(s): AMMONIA in the last 168 hours. Coagulation Profile: No results for input(s): INR, PROTIME in the last 168 hours. Cardiac Enzymes: Recent Labs  Lab 03/25/20 1733  CKTOTAL 78   BNP (last 3 results) No results for input(s): PROBNP in the last 8760 hours. HbA1C: No results for input(s): HGBA1C in the last 72 hours. CBG: No results for input(s): GLUCAP in the last 168 hours. Lipid Profile: No results for input(s): CHOL, HDL, LDLCALC, TRIG, CHOLHDL, LDLDIRECT in the last 72 hours. Thyroid Function Tests: No results for input(s): TSH, T4TOTAL, FREET4, T3FREE, THYROIDAB in the last 72 hours. Anemia Panel: No results for input(s): VITAMINB12, FOLATE, FERRITIN, TIBC, IRON, RETICCTPCT in the last 72 hours. Urine analysis:    Component Value Date/Time   COLORURINE YELLOW 10/02/2019 1711   APPEARANCEUR HAZY (A) 10/02/2019 1711   LABSPEC 1.021 10/02/2019 1711   PHURINE 5.0 10/02/2019 1711    GLUCOSEU 50 (A) 10/02/2019 1711   HGBUR SMALL (A) 10/02/2019 1711   BILIRUBINUR negative 03/25/2020 1721   KETONESUR negative 03/25/2020 1721   KETONESUR NEGATIVE 10/02/2019 1711   PROTEINUR =100 (A) 03/25/2020 1721   PROTEINUR NEGATIVE 10/02/2019 1711   UROBILINOGEN 0.2 03/25/2020 1721   NITRITE Negative 03/25/2020 1721   NITRITE POSITIVE (A) 10/02/2019 1711   LEUKOCYTESUR Trace (A) 03/25/2020 1721   LEUKOCYTESUR NEGATIVE 10/02/2019 1711    Radiological Exams on Admission: DG Chest Portable 1 View  Result Date: 03/30/2020 CLINICAL DATA:  COVID-19 positive.  Shortness of breath. EXAM: PORTABLE CHEST 1 VIEW COMPARISON:  None. FINDINGS: Cardiomediastinal silhouette is normal. No pneumothorax. Mild opacity seen in the left base. Minimal opacity not excluded in the right base. IMPRESSION: Mild opacity in the left base and possible minimal opacity in the right base. Findings worrisome for pneumonia given history. Recommend attention on follow-up. Electronically Signed   By: Dorise Bullion III M.D   On: 03/30/2020 13:46    EKG: Independently reviewed. ordered  Assessment/Plan Active Problems:   Acute hypoxemic respiratory failure due to COVID-19 (HCC)   Suspect Asthma exacerbation from COVID 19, improving, POA Acute respiratory failure with hypoxia due to COVID-19 PNA -Patient with presenting with SOB and reported fever at home; also with cough productive of clear sputum -She does not have a usual home O2 requirement and is currently requiring 5L [place] O2 -COVID POSITIVE -The patient has comorbidities which may increase the risk for ARDS/MODS including:   HTN, DM, obesity, asthma/COPD -Exam is concerning for development of ARDS/MODS due to respiratory distress,  --awaiting labwork -CXR with multifocal opacities which may be c/w COVID vs. Multifocal PNA; CT chest was not performed  -will hold abx for now -Will admit for further evaluation, close monitoring, and treatment -Monitor on  telemetry x at least 24 hours -At this time, will attempt to avoid use of aerosolized medications and use HFAs instead -Will check daily labs including BMP with Mag, Phos; LFTs; CBC with differential; CRP; ferritin; fibrinogen;  D-dimer -Will order steroids and Remdesivir (pharmacy consult) given +COVID test, +CXR, and hypoxia <94% on room air -If the patient shows clinical deterioration, consider transfer to ICU with PCCM consultation -Consider Tocilizumab and/or convalescent plasma if the patient does not stabilize on current treatment or if the patient has marked clinical decompensation; the patient does not appear to require these treatments at this time. -Will attempt to maintain euvolemia to a net negative fluid status -Will ask the patient to maintain an awake prone position for 16+ hours a day, if possible, with a minimum of 2-3 hours at a time -Patient was seen wearing full PPE including: gown, gloves, head cover, N95, and face shield; donning and doffing was in compliance with current standards. **--We will hold antibiotics until procalcitonin returns or patient presents her self **Inhaled corticosteroid and albuterol q4h prn at bedside. **cont' home singulair  Thrombocytopenia 125 on the third which improved to 201 on the eighth, the day of admission-resolved  Thrush-nystatin swish and swallow, continue, I think a day after resolution of symptoms.  Doubt need for diflucan.  Hyperglycemia and thought to be poorly controlled diabetes, possibly flared up from the current COVID --SSI   QTC 431  Patient needs to be med reconciled Hypertension-possibly on lisinopril 10 mg at home, will hold for this evening Diabetes-we will do SSI here, possibly on alogliptin at home and takes multiple over-the-counter medications Patient and/or Family completely agreed with the plan, expressed understanding and I answered all questions.  DVT prophylaxis:  Lovenox  Code Status:  Full - confirmed with  patient Family Communication: None present; I spoke with the patient's daughter by telephone. Disposition Plan:  The patient is from: home  Anticipated d/c is to: home without [facility] services once her respiratory issues have been resolved.  She may require home O2 at the time of discharge.  Anticipated d/c date will depend on clinical response to treatment, likely between 3 days (with completion of outpatient Remdesivir treatment) and 5 days  Patient is currently: acutely ill Consults called: None  Admission status: Admit - It is my clinical opinion that admission to INPATIENT is reasonable and necessary because of the expectation that this patient will require hospital care that crosses at least 2 midnights to treat this condition based on the medical complexity of the problems presented.  Given the aforementioned information, the predictability of an adverse outcome is felt to be significant.   Admission status: inpatient because hypoxic respiratory failure and requires atleast 2-3 days of monitoring that could not be done in the outpatient setting.  Think can be on the floor for now and don't think a stepdown is needed but will watch closely.  A total of 72 minutes utilized during this admission.  [facility]   If 7PM-7AM, please contact night-coverage [email or web address] Password [facility]  03/30/2020, 6:58 PM

## 2020-03-30 NOTE — ED Triage Notes (Signed)
Pt presents with sob and unable to walk  , o2 82% upon arrival

## 2020-03-30 NOTE — ED Notes (Signed)
Pt placed on 3L O2

## 2020-03-31 DIAGNOSIS — I10 Essential (primary) hypertension: Secondary | ICD-10-CM

## 2020-03-31 DIAGNOSIS — J9601 Acute respiratory failure with hypoxia: Secondary | ICD-10-CM | POA: Diagnosis not present

## 2020-03-31 DIAGNOSIS — J1282 Pneumonia due to coronavirus disease 2019: Secondary | ICD-10-CM

## 2020-03-31 DIAGNOSIS — E119 Type 2 diabetes mellitus without complications: Secondary | ICD-10-CM | POA: Diagnosis not present

## 2020-03-31 DIAGNOSIS — U071 COVID-19: Secondary | ICD-10-CM | POA: Diagnosis not present

## 2020-03-31 LAB — FERRITIN: Ferritin: 542 ng/mL — ABNORMAL HIGH (ref 11–307)

## 2020-03-31 LAB — CBC WITH DIFFERENTIAL/PLATELET
Abs Immature Granulocytes: 0.03 10*3/uL (ref 0.00–0.07)
Basophils Absolute: 0 10*3/uL (ref 0.0–0.1)
Basophils Relative: 0 %
Eosinophils Absolute: 0 10*3/uL (ref 0.0–0.5)
Eosinophils Relative: 0 %
HCT: 42 % (ref 36.0–46.0)
Hemoglobin: 13.6 g/dL (ref 12.0–15.0)
Immature Granulocytes: 1 %
Lymphocytes Relative: 14 %
Lymphs Abs: 0.8 10*3/uL (ref 0.7–4.0)
MCH: 29.1 pg (ref 26.0–34.0)
MCHC: 32.4 g/dL (ref 30.0–36.0)
MCV: 89.7 fL (ref 80.0–100.0)
Monocytes Absolute: 0.2 10*3/uL (ref 0.1–1.0)
Monocytes Relative: 3 %
Neutro Abs: 5 10*3/uL (ref 1.7–7.7)
Neutrophils Relative %: 82 %
Platelets: 217 10*3/uL (ref 150–400)
RBC: 4.68 MIL/uL (ref 3.87–5.11)
RDW: 12.5 % (ref 11.5–15.5)
WBC: 6 10*3/uL (ref 4.0–10.5)
nRBC: 0 % (ref 0.0–0.2)

## 2020-03-31 LAB — COMPREHENSIVE METABOLIC PANEL
ALT: 18 U/L (ref 0–44)
AST: 21 U/L (ref 15–41)
Albumin: 3.1 g/dL — ABNORMAL LOW (ref 3.5–5.0)
Alkaline Phosphatase: 54 U/L (ref 38–126)
Anion gap: 13 (ref 5–15)
BUN: 15 mg/dL (ref 8–23)
CO2: 22 mmol/L (ref 22–32)
Calcium: 8.7 mg/dL — ABNORMAL LOW (ref 8.9–10.3)
Chloride: 102 mmol/L (ref 98–111)
Creatinine, Ser: 0.79 mg/dL (ref 0.44–1.00)
GFR, Estimated: >60 mL/min (ref >60)
Glucose, Bld: 277 mg/dL — ABNORMAL HIGH (ref 70–99)
Potassium: 4.4 mmol/L (ref 3.5–5.1)
Sodium: 137 mmol/L (ref 135–145)
Total Bilirubin: 0.5 mg/dL (ref 0.3–1.2)
Total Protein: 7 g/dL (ref 6.5–8.1)

## 2020-03-31 LAB — CBG MONITORING, ED
Glucose-Capillary: 225 mg/dL — ABNORMAL HIGH (ref 70–99)
Glucose-Capillary: 252 mg/dL — ABNORMAL HIGH (ref 70–99)
Glucose-Capillary: 221 mg/dL — ABNORMAL HIGH (ref 70–99)

## 2020-03-31 LAB — D-DIMER, QUANTITATIVE: D-Dimer, Quant: 0.9 ug/mL-FEU — ABNORMAL HIGH (ref 0.00–0.50)

## 2020-03-31 LAB — MAGNESIUM: Magnesium: 1.9 mg/dL (ref 1.7–2.4)

## 2020-03-31 LAB — C-REACTIVE PROTEIN: CRP: 3.1 mg/dL — ABNORMAL HIGH (ref <1.0)

## 2020-03-31 MED ORDER — BUDESONIDE 180 MCG/ACT IN AEPB
1.0000 | INHALATION_SPRAY | Freq: Two times a day (BID) | RESPIRATORY_TRACT | Status: DC
  Administered 2020-03-31 – 2020-04-03 (×5): 1 via RESPIRATORY_TRACT
  Filled 2020-03-31 (×2): qty 1

## 2020-03-31 MED ORDER — LISINOPRIL 5 MG PO TABS
5.0000 mg | ORAL_TABLET | Freq: Every day | ORAL | Status: DC
  Administered 2020-03-31 – 2020-04-03 (×4): 5 mg via ORAL
  Filled 2020-03-31 (×4): qty 1

## 2020-03-31 MED ORDER — METHYLPREDNISOLONE SODIUM SUCC 40 MG IJ SOLR
40.0000 mg | Freq: Two times a day (BID) | INTRAMUSCULAR | Status: DC
  Administered 2020-03-31 – 2020-04-03 (×7): 40 mg via INTRAVENOUS
  Filled 2020-03-31 (×7): qty 1

## 2020-03-31 MED ORDER — INSULIN GLARGINE 100 UNIT/ML ~~LOC~~ SOLN
10.0000 [IU] | Freq: Every day | SUBCUTANEOUS | Status: DC
  Administered 2020-03-31: 10 [IU] via SUBCUTANEOUS
  Filled 2020-03-31 (×2): qty 0.1

## 2020-03-31 MED ORDER — INSULIN ASPART 100 UNIT/ML ~~LOC~~ SOLN
4.0000 [IU] | Freq: Three times a day (TID) | SUBCUTANEOUS | Status: DC
  Administered 2020-03-31 – 2020-04-03 (×9): 4 [IU] via SUBCUTANEOUS

## 2020-03-31 MED ORDER — INSULIN ASPART 100 UNIT/ML ~~LOC~~ SOLN
0.0000 [IU] | Freq: Three times a day (TID) | SUBCUTANEOUS | Status: DC
  Administered 2020-03-31: 7 [IU] via SUBCUTANEOUS
  Administered 2020-03-31: 11 [IU] via SUBCUTANEOUS
  Administered 2020-04-01 (×2): 4 [IU] via SUBCUTANEOUS
  Administered 2020-04-01: 7 [IU] via SUBCUTANEOUS
  Administered 2020-04-02: 11 [IU] via SUBCUTANEOUS
  Administered 2020-04-02: 3 [IU] via SUBCUTANEOUS
  Administered 2020-04-02: 7 [IU] via SUBCUTANEOUS
  Administered 2020-04-03: 11 [IU] via SUBCUTANEOUS

## 2020-03-31 MED ORDER — LOPERAMIDE HCL 2 MG PO CAPS: 2.0000 mg | ORAL_CAPSULE | ORAL | Status: DC | PRN

## 2020-03-31 NOTE — Progress Notes (Signed)
PROGRESS NOTE                                                                                                                                                                                                             Patient Demographics:    Megan Salazar, is a 60 y.o. female, DOB - [birth year], [id number]  Outpatient Primary MD for the patient is Megan Fire, MD   Admit date - 03/30/2020   LOS - 1  Chief Complaint  Patient presents with  . Covid Positive       Brief Narrative: Patient is a 60 y.o. female with PMHx of DM-2, IBS, asthma-who started having URI symptoms around 1/1-subsequently tested positive for COVID-19 on 1/3-presented to the hospital with worsening shortness of breath-found to have acute hypoxic respiratory failure due to COVID-19 pneumonia.  COVID-19 vaccinated status: Unvaccinated  Significant Events: 1/8>> Admit to [facility] for  Significant studies: 1/8>>Chest x-ray: Bilateral pneumonia.  COVID-19 medications: Steroids: 1/8>> Remdesivir: 1/8>>  Antibiotics: None  Microbiology data: None  Procedures: None  Consults: None  DVT prophylaxis: enoxaparin (LOVENOX) injection 40 mg Start: 03/30/20 2000 SCDs Start: 03/30/20 1901    Subjective:    Megan Salazar today feels better-she was on just 1.5-2 L of oxygen this morning.  No shortness of breath at rest.   Assessment  & Plan :   Acute Hypoxic Resp Failure due to Covid 19 Viral pneumonia: Has mild hypoxemia-on around 2 L of oxygen this morning-continue steroids/Remdesivir.  CRP minimally elevated.  Continue to follow closely-and encourage incentive spirometry/flutter valve-attempt to titrate off oxygen.  Fever: afebrile O2 requirements:  SpO2: 91 % O2 Flow Rate (L/min): 3 L/min   COVID-19 Labs: Recent Labs    03/30/20 1935 03/30/20 1949 03/31/20 0544  DDIMER 1.18*  --  0.90*  FERRITIN 548*  --   --   LDH  --   347*  --   CRP 2.0*  --   --        Component Value Date/Time   BNP 15.5 03/30/2020 1935    Recent Labs  Lab 03/30/20 1935  PROCALCITON <0.10    No results found for: SARSCOV2NAA    Prone/Incentive Spirometry: encouraged incentive spirometry use 3-4/hour  DM-2 with uncontrolled hyperglycemia due to steroids: Per patient-she manages DM at home with over-the-counter supplements-check A1c-start Lantus 10 units  daily, add 4 units of NovoLog with meals-change to resistant SSI.  Follow and adjust.  Check A1c with a.m. labs.  HTN: Resume lisinopril-and follow.  IBS-diarrhea predominant: At baseline-we will order Imodium as needed.  Obesity: Estimated body mass index is 36.9 kg/m as calculated from the following:   Height as of 03/25/20: 5\' 4"  (1.626 m).   Weight as of 03/25/20: 97.5 kg.    ABG: No results found for: PHART, PCO2ART, PO2ART, HCO3, TCO2, ACIDBASEDEF, O2SAT  Vent Settings: N/A  Condition - Guarded  Family Communication  : Will update daughter later today (nurse will put her no in system-currently not listed).  Code Status :  Full Code  Diet :  Diet Order            Diet heart healthy/carb modified Room service appropriate? Yes; Fluid consistency: Thin  Diet effective now                  Disposition Plan  :   Status is: Inpatient  Remains inpatient appropriate because:Inpatient level of care appropriate due to severity of illness   Dispo: The patient is from: Home              Anticipated d/c is to: Home              Anticipated d/c date is: 2 days              Patient currently is not medically stable to d/c.   Barriers to discharge: Hypoxia requiring O2 supplementation/complete 5 days of IV Remdesivir  Antimicorbials  :    Anti-infectives (From admission, onward)   Start     Dose/Rate Route Frequency Ordered Stop   03/31/20 1000  remdesivir 100 mg in sodium chloride 0.9 % 100 mL IVPB       "Followed by" Linked Group Details   100 mg 200  mL/hr over 30 Minutes Intravenous Daily 03/30/20 1844 04/04/20 0959   03/30/20 2100  remdesivir 200 mg in sodium chloride 0.9% 250 mL IVPB       "Followed by" Linked Group Details   200 mg 580 mL/hr over 30 Minutes Intravenous Once 03/30/20 1844 03/30/20 2202      Inpatient Medications  Scheduled Meds: . budesonide  0.5 mg Inhalation BID  . enoxaparin (LOVENOX) injection  40 mg Subcutaneous Q24H  . insulin aspart  0-9 Units Subcutaneous TID WC  . methylPREDNISolone (SOLU-MEDROL) injection  40 mg Intravenous Q12H  . montelukast  10 mg Oral QHS  . nystatin  5 mL Oral QID   Continuous Infusions: . remdesivir 100 mg in NS 100 mL 100 mg (03/31/20 0945)   PRN Meds:.acetaminophen, albuterol, ondansetron **OR** ondansetron (ZOFRAN) IV, senna-docusate   Time Spent in minutes  25  See all Orders from today for further details   Oren Binet M.D on 03/31/2020 at 10:52 AM  To page go to [email or web address] - use universal password  [facility] -  Office  [phone]    Objective:   Vitals:   03/30/20 2345 03/31/20 0200 03/31/20 0525 03/31/20 0834  BP: 137/66 120/72 140/79 90/74  Pulse: 69 (!) 56 73 70  Resp: (!) 26 (!) 28 (!) 25 (!) 29  Temp:  97.9 F (36.6 C)    TempSrc:  Oral    SpO2: 95% 96% 94% 91%    Wt Readings from Last 3 Encounters:  03/25/20 97.5 kg  10/02/19 100.7 kg  09/18/19 100.7 kg    No intake or output  data in the 24 hours ending 03/31/20 1052   Physical Exam Gen Exam:Alert awake-not in any distress HEENT:atraumatic, normocephalic Chest: B/L clear to auscultation anteriorly CVS:S1S2 regular Abdomen:soft non tender, non distended Extremities:no edema Neurology: Non focal Skin: no rash   Data Review:    CBC Recent Labs  Lab 03/25/20 1733 03/30/20 1246 03/31/20 0544  WBC 3.2* 8.5 6.0  HGB 14.2 14.2 13.6  HCT 41.7 43.8 42.0  PLT 125* 201 217  MCV 88 90.7 89.7  MCH 30.1 29.4 29.1  MCHC 34.1 32.4 32.4  RDW 12.4 12.4 12.5  LYMPHSABS  1.4  --  0.8  MONOABS  --   --  0.2  EOSABS 0.0  --  0.0  BASOSABS 0.0  --  0.0    Chemistries  Recent Labs  Lab 03/25/20 1733 03/30/20 1246 03/31/20 0544  NA 135 136 137  K 4.4 4.1 4.4  CL 98 97* 102  CO2 24 27 22   GLUCOSE 227* 237* 277*  BUN 10 13 15   CREATININE 0.81 0.77 0.79  CALCIUM 8.9 8.9 8.7*  MG  --   --  1.9  AST 28  --  21  ALT 15  --  18  ALKPHOS 75  --  54  BILITOT 0.7  --  0.5   ------------------------------------------------------------------------------------------------------------------ No results for input(s): CHOL, HDL, LDLCALC, TRIG, CHOLHDL, LDLDIRECT in the last 72 hours.  No results found for: HGBA1C ------------------------------------------------------------------------------------------------------------------ No results for input(s): TSH, T4TOTAL, T3FREE, THYROIDAB in the last 72 hours.  Invalid input(s): FREET3 ------------------------------------------------------------------------------------------------------------------ Recent Labs    03/30/20 1935  FERRITIN 548*    Coagulation profile No results for input(s): INR, PROTIME in the last 168 hours.  Recent Labs    03/30/20 1935 03/31/20 0544  DDIMER 1.18* 0.90*    Cardiac Enzymes No results for input(s): CKMB, TROPONINI, MYOGLOBIN in the last 168 hours.  Invalid input(s): CK ------------------------------------------------------------------------------------------------------------------    Component Value Date/Time   BNP 15.5 03/30/2020 1935    Micro Results Recent Results (from the past 240 hour(s))  Covid-19, Flu A+B ([facility])     Status: Abnormal   Collection Time: 03/25/20  4:57 PM   Specimen: Nasopharyngeal   Naso  Result Value Ref Range Status   SARS-CoV-2, NAA Detected (A) Not Detected Final    Comment: Patients who have a positive COVID-19 test result may now have treatment options. Treatment options are available for patients with mild to moderate symptoms  and for hospitalized patients. Visit our website at [email or web address] for resources and information. This nucleic acid amplification test was developed and its performance characteristics determined by [employer]. Nucleic acid amplification tests include RT-PCR and TMA. This test has not been FDA cleared or approved. This test has been authorized by FDA under an Emergency Use Authorization (EUA). This test is only authorized for the duration of time the declaration that circumstances exist justifying the authorization of the emergency use of in vitro diagnostic tests for detection of SARS-CoV-2 virus and/or diagnosis of COVID-19 infection under section 564(b)(1) of the Act, 21 U.S.C. 109NAT-5(T) (1), unless the authorization is terminated or revoked sooner. When diagnostic testing is negativ e, the possibility of a false negative result should be considered in the context of a patient's recent exposures and the presence of clinical signs and symptoms consistent with COVID-19. An individual without symptoms of COVID-19 and who is not shedding SARS-CoV-2 virus would expect to have a negative (not detected) result in this assay.    Influenza A, NAA  Not Detected Not Detected Final   Influenza B, NAA Not Detected Not Detected Final    Radiology Reports DG Chest Portable 1 View  Result Date: 03/30/2020 CLINICAL DATA:  COVID-19 positive.  Shortness of breath. EXAM: PORTABLE CHEST 1 VIEW COMPARISON:  None. FINDINGS: Cardiomediastinal silhouette is normal. No pneumothorax. Mild opacity seen in the left base. Minimal opacity not excluded in the right base. IMPRESSION: Mild opacity in the left base and possible minimal opacity in the right base. Findings worrisome for pneumonia given history. Recommend attention on follow-up. Electronically Signed   By: Dorise Bullion III M.D   On: 03/30/2020 13:46   MM 3D SCREEN BREAST BILATERAL  Result Date: 03/13/2020 CLINICAL DATA:   Screening. EXAM: DIGITAL SCREENING BILATERAL MAMMOGRAM WITH TOMO AND CAD COMPARISON:  Previous exam(s). ACR Breast Density Category a: The breast tissue is almost entirely fatty. FINDINGS: There are no findings suspicious for malignancy. Images were processed with CAD. IMPRESSION: No mammographic evidence of malignancy. A result letter of this screening mammogram will be mailed directly to the patient. RECOMMENDATION: Screening mammogram in one year. (Code:SM-B-01Y) BI-RADS CATEGORY  1: Negative. Electronically Signed   By: Lajean Manes M.D.   On: 03/13/2020 08:33

## 2020-03-31 NOTE — ED Notes (Signed)
Pt given lunch

## 2020-04-01 DIAGNOSIS — E119 Type 2 diabetes mellitus without complications: Secondary | ICD-10-CM | POA: Diagnosis not present

## 2020-04-01 DIAGNOSIS — J9601 Acute respiratory failure with hypoxia: Secondary | ICD-10-CM | POA: Diagnosis not present

## 2020-04-01 DIAGNOSIS — I10 Essential (primary) hypertension: Secondary | ICD-10-CM | POA: Diagnosis not present

## 2020-04-01 DIAGNOSIS — U071 COVID-19: Secondary | ICD-10-CM | POA: Diagnosis not present

## 2020-04-01 LAB — CBC WITH DIFFERENTIAL/PLATELET
Abs Immature Granulocytes: 0.06 10*3/uL (ref 0.00–0.07)
Basophils Absolute: 0 10*3/uL (ref 0.0–0.1)
Basophils Relative: 0 %
Eosinophils Absolute: 0 10*3/uL (ref 0.0–0.5)
Eosinophils Relative: 0 %
HCT: 41.2 % (ref 36.0–46.0)
Hemoglobin: 13.8 g/dL (ref 12.0–15.0)
Immature Granulocytes: 1 %
Lymphocytes Relative: 12 %
Lymphs Abs: 1.4 10*3/uL (ref 0.7–4.0)
MCH: 29.8 pg (ref 26.0–34.0)
MCHC: 33.5 g/dL (ref 30.0–36.0)
MCV: 89 fL (ref 80.0–100.0)
Monocytes Absolute: 0.5 10*3/uL (ref 0.1–1.0)
Monocytes Relative: 4 %
Neutro Abs: 10 10*3/uL — ABNORMAL HIGH (ref 1.7–7.7)
Neutrophils Relative %: 83 %
Platelets: 274 10*3/uL (ref 150–400)
RBC: 4.63 MIL/uL (ref 3.87–5.11)
RDW: 12.7 % (ref 11.5–15.5)
WBC: 11.9 10*3/uL — ABNORMAL HIGH (ref 4.0–10.5)
nRBC: 0 % (ref 0.0–0.2)

## 2020-04-01 LAB — COMPREHENSIVE METABOLIC PANEL
ALT: 15 U/L (ref 0–44)
AST: 17 U/L (ref 15–41)
Albumin: 3.1 g/dL — ABNORMAL LOW (ref 3.5–5.0)
Alkaline Phosphatase: 57 U/L (ref 38–126)
Anion gap: 13 (ref 5–15)
BUN: 21 mg/dL (ref 8–23)
CO2: 22 mmol/L (ref 22–32)
Calcium: 9 mg/dL (ref 8.9–10.3)
Chloride: 102 mmol/L (ref 98–111)
Creatinine, Ser: 0.85 mg/dL (ref 0.44–1.00)
GFR, Estimated: >60 mL/min (ref >60)
Glucose, Bld: 253 mg/dL — ABNORMAL HIGH (ref 70–99)
Potassium: 4.4 mmol/L (ref 3.5–5.1)
Sodium: 137 mmol/L (ref 135–145)
Total Bilirubin: 0.7 mg/dL (ref 0.3–1.2)
Total Protein: 6.8 g/dL (ref 6.5–8.1)

## 2020-04-01 LAB — HEMOGLOBIN A1C
Hgb A1c MFr Bld: 7.9 % — ABNORMAL HIGH (ref 4.8–5.6)
Mean Plasma Glucose: 180.03 mg/dL

## 2020-04-01 LAB — CBG MONITORING, ED
Glucose-Capillary: 222 mg/dL — ABNORMAL HIGH (ref 70–99)
Glucose-Capillary: 167 mg/dL — ABNORMAL HIGH (ref 70–99)
Glucose-Capillary: 195 mg/dL — ABNORMAL HIGH (ref 70–99)

## 2020-04-01 LAB — MAGNESIUM: Magnesium: 2.3 mg/dL (ref 1.7–2.4)

## 2020-04-01 LAB — FERRITIN: Ferritin: 501 ng/mL — ABNORMAL HIGH (ref 11–307)

## 2020-04-01 LAB — C-REACTIVE PROTEIN: CRP: 1.4 mg/dL — ABNORMAL HIGH (ref <1.0)

## 2020-04-01 LAB — D-DIMER, QUANTITATIVE: D-Dimer, Quant: 0.8 ug/mL-FEU — ABNORMAL HIGH (ref 0.00–0.50)

## 2020-04-01 MED ORDER — INSULIN GLARGINE 100 UNIT/ML ~~LOC~~ SOLN
15.0000 [IU] | Freq: Every day | SUBCUTANEOUS | Status: DC
  Administered 2020-04-01 – 2020-04-03 (×3): 15 [IU] via SUBCUTANEOUS
  Filled 2020-04-01 (×3): qty 0.15

## 2020-04-01 NOTE — ED Notes (Signed)
Lunch Tray Ordered 1115. 

## 2020-04-01 NOTE — Progress Notes (Addendum)
PROGRESS NOTE                                                                                                                                                                                                             Patient Demographics:    Megan Salazar, is a 60 y.o. female, DOB - December 02, 1960, TGG:[id number]  Outpatient Primary MD for the patient is Rosita Fire, MD   Admit date - 03/30/2020   LOS - 2  Chief Complaint  Patient presents with  . Covid Positive       Brief Narrative: Patient is a 60 y.o. female with PMHx of DM-2, IBS, asthma-who started having URI symptoms around 1/1-subsequently tested positive for COVID-19 on 1/3-presented to the hospital with worsening shortness of breath-found to have acute hypoxic respiratory failure due to COVID-19 pneumonia.  COVID-19 vaccinated status: Unvaccinated  Significant Events: 1/8>> Admit to [facility] for hypoxia due to COVID-19 pneumonia  Significant studies: 1/8>>Chest x-ray: Bilateral pneumonia.  COVID-19 medications: Steroids: 1/8>> Remdesivir: 1/8>>  Antibiotics: None  Microbiology data: None  Procedures: None  Consults: None  DVT prophylaxis: enoxaparin (LOVENOX) injection 40 mg Start: 03/30/20 2000 SCDs Start: 03/30/20 1901    Subjective:   Feels better-less cough-still on 2 L of oxygen.   Assessment  & Plan :   Acute Hypoxic Resp Failure due to Covid 19 Viral pneumonia: Continues to have mild hypoxemia-stable on just 2 L of oxygen.  CRP trending down.  Continue steroids/Remdesivir-continued attempts to slowly titrate off oxygen.  Suspect needs another 1-2 days of inpatient hospitalization before consideration of discharge.    Fever: afebrile O2 requirements:  SpO2: 90 % O2 Flow Rate (L/min): 2 L/min   COVID-19 Labs: Recent Labs    03/30/20 1935 03/30/20 1949 03/31/20 0544 04/01/20 0500  DDIMER 1.18*  --  0.90* 0.80*  FERRITIN  548*  --  542* 501*  LDH  --  347*  --   --   CRP 2.0*  --  3.1* 1.4*       Component Value Date/Time   BNP 15.5 03/30/2020 1935    Recent Labs  Lab 03/30/20 1935  PROCALCITON <0.10    No results found for: SARSCOV2NAA    Prone/Incentive Spirometry: encouraged incentive spirometry use 3-4/hour  DM-2 (A1c 7.9 on 1/10) with uncontrolled hyperglycemia due to steroids: Per patient-she manages  DM at home with over-the-counter supplements.  Increase Lantus to 15 units, continue 4 units of NovoLog with meals-follow and adjust.  Do not anticipate discharging on insulin-suspect that once steroids get tapered down-she will continue to improve on her own.    Recent Labs    03/31/20 1255 03/31/20 1704 04/01/20 0746  GLUCAP 252* 221* 222*    HTN: Controlled-continue lisinopril  IBS-diarrhea predominant: At baseline-we will order Imodium as needed.  Obesity: Estimated body mass index is 36.9 kg/m as calculated from the following:   Height as of 03/25/20: 5\' 4"  (1.626 m).   Weight as of 03/25/20: 97.5 kg.    ABG: No results found for: PHART, PCO2ART, PO2ART, HCO3, TCO2, ACIDBASEDEF, O2SAT  Vent Settings: N/A  Condition - Guarded  Family Communication  : Daughter-[phone] Voicemail 1/10   Code Status :  Full Code  Diet :  Diet Order            Diet heart healthy/carb modified Room service appropriate? Yes; Fluid consistency: Thin  Diet effective now                  Disposition Plan  :   Status is: Inpatient  Remains inpatient appropriate because:Inpatient level of care appropriate due to severity of illness   Dispo: The patient is from: Home              Anticipated d/c is to: Home              Anticipated d/c date is: 2 days              Patient currently is not medically stable to d/c.   Barriers to discharge: Hypoxia requiring O2 supplementation/complete 5 days of IV Remdesivir  Antimicorbials  :    Anti-infectives (From admission,  onward)   Start     Dose/Rate Route Frequency Ordered Stop   03/31/20 1000  remdesivir 100 mg in sodium chloride 0.9 % 100 mL IVPB       "Followed by" Linked Group Details   100 mg 200 mL/hr over 30 Minutes Intravenous Daily 03/30/20 1844 04/04/20 0959   03/30/20 2100  remdesivir 200 mg in sodium chloride 0.9% 250 mL IVPB       "Followed by" Linked Group Details   200 mg 580 mL/hr over 30 Minutes Intravenous Once 03/30/20 1844 03/30/20 2202      Inpatient Medications  Scheduled Meds: . budesonide  1 puff Inhalation BID  . enoxaparin (LOVENOX) injection  40 mg Subcutaneous Q24H  . insulin aspart  0-20 Units Subcutaneous TID WC  . insulin aspart  4 Units Subcutaneous TID WC  . insulin glargine  15 Units Subcutaneous Daily  . lisinopril  5 mg Oral Daily  . methylPREDNISolone (SOLU-MEDROL) injection  40 mg Intravenous Q12H  . montelukast  10 mg Oral QHS  . nystatin  5 mL Oral QID   Continuous Infusions: . remdesivir 100 mg in NS 100 mL 100 mg (04/01/20 1051)   PRN Meds:.acetaminophen, albuterol, loperamide, ondansetron **OR** ondansetron (ZOFRAN) IV, senna-docusate   Time Spent in minutes  25  See all Orders from today for further details   Oren Binet M.D on 04/01/2020 at 11:08 AM  To page go to [email or web address] - use universal password  [facility] -  Office  [phone]    Objective:   Vitals:   03/31/20 2000 04/01/20 0120 04/01/20 0600 04/01/20 0800  BP:  (!) 152/80 125/73 128/74  Pulse: 64 76 68 (!) 58  Resp: 19 (!) 21 (!) 31 16  Temp:   98 F (36.7 C)   TempSrc:   Oral   SpO2: 93% 93% 98% 90%    Wt Readings from Last 3 Encounters:  03/25/20 97.5 kg  10/02/19 100.7 kg  09/18/19 100.7 kg    No intake or output data in the 24 hours ending 04/01/20 1108   Physical Exam Gen Exam:Alert awake-not in any distress HEENT:atraumatic, normocephalic Chest: B/L clear to auscultation anteriorly CVS:S1S2 regular Abdomen:soft non tender, non  distended Extremities:no edema Neurology: Non focal Skin: no rash   Data Review:    CBC Recent Labs  Lab 03/25/20 1733 03/30/20 1246 03/31/20 0544 04/01/20 0500  WBC 3.2* 8.5 6.0 11.9*  HGB 14.2 14.2 13.6 13.8  HCT 41.7 43.8 42.0 41.2  PLT 125* 201 217 274  MCV 88 90.7 89.7 89.0  MCH 30.1 29.4 29.1 29.8  MCHC 34.1 32.4 32.4 33.5  RDW 12.4 12.4 12.5 12.7  LYMPHSABS 1.4  --  0.8 1.4  MONOABS  --   --  0.2 0.5  EOSABS 0.0  --  0.0 0.0  BASOSABS 0.0  --  0.0 0.0    Chemistries  Recent Labs  Lab 03/25/20 1733 03/30/20 1246 03/31/20 0544 04/01/20 0500  NA 135 136 137 137  K 4.4 4.1 4.4 4.4  CL 98 97* 102 102  CO2 24 27 22 22   GLUCOSE 227* 237* 277* 253*  BUN 10 13 15 21   CREATININE 0.81 0.77 0.79 0.85  CALCIUM 8.9 8.9 8.7* 9.0  MG  --   --  1.9 2.3  AST 28  --  21 17  ALT 15  --  18 15  ALKPHOS 75  --  54 57  BILITOT 0.7  --  0.5 0.7   ------------------------------------------------------------------------------------------------------------------ No results for input(s): CHOL, HDL, LDLCALC, TRIG, CHOLHDL, LDLDIRECT in the last 72 hours.  Lab Results  Component Value Date   HGBA1C 7.9 (H) 04/01/2020   ------------------------------------------------------------------------------------------------------------------ No results for input(s): TSH, T4TOTAL, T3FREE, THYROIDAB in the last 72 hours.  Invalid input(s): FREET3 ------------------------------------------------------------------------------------------------------------------ Recent Labs    03/31/20 0544 04/01/20 0500  FERRITIN 542* 501*    Coagulation profile No results for input(s): INR, PROTIME in the last 168 hours.  Recent Labs    03/31/20 0544 04/01/20 0500  DDIMER 0.90* 0.80*    Cardiac Enzymes No results for input(s): CKMB, TROPONINI, MYOGLOBIN in the last 168 hours.  Invalid input(s):  CK ------------------------------------------------------------------------------------------------------------------    Component Value Date/Time   BNP 15.5 03/30/2020 1935    Micro Results Recent Results (from the past 240 hour(s))  Covid-19, Flu A+B ([facility])     Status: Abnormal   Collection Time: 03/25/20  4:57 PM   Specimen: Nasopharyngeal   Naso  Result Value Ref Range Status   SARS-CoV-2, NAA Detected (A) Not Detected Final    Comment: Patients who have a positive COVID-19 test result may now have treatment options. Treatment options are available for patients with mild to moderate symptoms and for hospitalized patients. Visit our website at [email or web address] for resources and information. This nucleic acid amplification test was developed and its performance characteristics determined by [employer]. Nucleic acid amplification tests include RT-PCR and TMA. This test has not been FDA cleared or approved. This test has been authorized by FDA under an Emergency Use Authorization (EUA). This test is only authorized for the duration of time the declaration that circumstances exist justifying the authorization of the emergency use of in  vitro diagnostic tests for detection of SARS-CoV-2 virus and/or diagnosis of COVID-19 infection under section 564(b)(1) of the Act, 21 U.S.C. [id number]) (1), unless the authorization is terminated or revoked sooner. When diagnostic testing is negativ e, the possibility of a false negative result should be considered in the context of a patient's recent exposures and the presence of clinical signs and symptoms consistent with COVID-19. An individual without symptoms of COVID-19 and who is not shedding SARS-CoV-2 virus would expect to have a negative (not detected) result in this assay.    Influenza A, NAA Not Detected Not Detected Final   Influenza B, NAA Not Detected Not Detected Final    Radiology Reports DG Chest  Portable 1 View  Result Date: 03/30/2020 CLINICAL DATA:  COVID-19 positive.  Shortness of breath. EXAM: PORTABLE CHEST 1 VIEW COMPARISON:  None. FINDINGS: Cardiomediastinal silhouette is normal. No pneumothorax. Mild opacity seen in the left base. Minimal opacity not excluded in the right base. IMPRESSION: Mild opacity in the left base and possible minimal opacity in the right base. Findings worrisome for pneumonia given history. Recommend attention on follow-up. Electronically Signed   By: Dorise Bullion III M.D   On: 03/30/2020 13:46   MM 3D SCREEN BREAST BILATERAL  Result Date: 03/13/2020 CLINICAL DATA:  Screening. EXAM: DIGITAL SCREENING BILATERAL MAMMOGRAM WITH TOMO AND CAD COMPARISON:  Previous exam(s). ACR Breast Density Category a: The breast tissue is almost entirely fatty. FINDINGS: There are no findings suspicious for malignancy. Images were processed with CAD. IMPRESSION: No mammographic evidence of malignancy. A result letter of this screening mammogram will be mailed directly to the patient. RECOMMENDATION: Screening mammogram in one year. (Code:SM-B-01Y) BI-RADS CATEGORY  1: Negative. Electronically Signed   By: Lajean Manes M.D.   On: 03/13/2020 08:33

## 2020-04-01 NOTE — ED Notes (Signed)
MS Breakfast Ordered 

## 2020-04-01 NOTE — ED Notes (Signed)
Dinner Trays Ordered @ [phone].

## 2020-04-01 NOTE — ED Notes (Signed)
Pt provided lunch tray and assisted to seat position to eat.  Pt does not appear in distress, respirations are even and non-labored  Skin is warm, dry and intact.   C/o mild body aches  Call light within reach and denies further needs at this time

## 2020-04-01 NOTE — ED Notes (Signed)
Pt able to ambulate from bed to commode independently and then return to bed Increased shortness of breath with activity, returned to baseline once resting again

## 2020-04-02 ENCOUNTER — Encounter (HOSPITAL_COMMUNITY): Payer: Self-pay | Admitting: Internal Medicine

## 2020-04-02 DIAGNOSIS — U071 COVID-19: Secondary | ICD-10-CM | POA: Diagnosis not present

## 2020-04-02 DIAGNOSIS — J9601 Acute respiratory failure with hypoxia: Secondary | ICD-10-CM | POA: Diagnosis not present

## 2020-04-02 DIAGNOSIS — E119 Type 2 diabetes mellitus without complications: Secondary | ICD-10-CM | POA: Diagnosis not present

## 2020-04-02 DIAGNOSIS — I10 Essential (primary) hypertension: Secondary | ICD-10-CM | POA: Diagnosis not present

## 2020-04-02 LAB — COMPREHENSIVE METABOLIC PANEL
ALT: 18 U/L (ref 0–44)
AST: 20 U/L (ref 15–41)
Albumin: 3.2 g/dL — ABNORMAL LOW (ref 3.5–5.0)
Alkaline Phosphatase: 57 U/L (ref 38–126)
Anion gap: 12 (ref 5–15)
BUN: 24 mg/dL — ABNORMAL HIGH (ref 8–23)
CO2: 25 mmol/L (ref 22–32)
Calcium: 9.2 mg/dL (ref 8.9–10.3)
Chloride: 100 mmol/L (ref 98–111)
Creatinine, Ser: 0.9 mg/dL (ref 0.44–1.00)
GFR, Estimated: >60 mL/min (ref >60)
Glucose, Bld: 286 mg/dL — ABNORMAL HIGH (ref 70–99)
Potassium: 5 mmol/L (ref 3.5–5.1)
Sodium: 137 mmol/L (ref 135–145)
Total Bilirubin: 0.9 mg/dL (ref 0.3–1.2)
Total Protein: 7.1 g/dL (ref 6.5–8.1)

## 2020-04-02 LAB — CBC WITH DIFFERENTIAL/PLATELET
Abs Immature Granulocytes: 0.07 10*3/uL (ref 0.00–0.07)
Basophils Absolute: 0 10*3/uL (ref 0.0–0.1)
Basophils Relative: 0 %
Eosinophils Absolute: 0 10*3/uL (ref 0.0–0.5)
Eosinophils Relative: 0 %
HCT: 42.5 % (ref 36.0–46.0)
Hemoglobin: 14.5 g/dL (ref 12.0–15.0)
Immature Granulocytes: 1 %
Lymphocytes Relative: 8 %
Lymphs Abs: 1 10*3/uL (ref 0.7–4.0)
MCH: 30.4 pg (ref 26.0–34.0)
MCHC: 34.1 g/dL (ref 30.0–36.0)
MCV: 89.1 fL (ref 80.0–100.0)
Monocytes Absolute: 0.4 10*3/uL (ref 0.1–1.0)
Monocytes Relative: 4 %
Neutro Abs: 10.2 10*3/uL — ABNORMAL HIGH (ref 1.7–7.7)
Neutrophils Relative %: 87 %
Platelets: 321 10*3/uL (ref 150–400)
RBC: 4.77 MIL/uL (ref 3.87–5.11)
RDW: 12.5 % (ref 11.5–15.5)
WBC: 11.7 10*3/uL — ABNORMAL HIGH (ref 4.0–10.5)
nRBC: 0 % (ref 0.0–0.2)

## 2020-04-02 LAB — GLUCOSE, CAPILLARY
Glucose-Capillary: 256 mg/dL — ABNORMAL HIGH (ref 70–99)
Glucose-Capillary: 143 mg/dL — ABNORMAL HIGH (ref 70–99)
Glucose-Capillary: 220 mg/dL — ABNORMAL HIGH (ref 70–99)
Glucose-Capillary: 203 mg/dL — ABNORMAL HIGH (ref 70–99)

## 2020-04-02 LAB — CBG MONITORING, ED: Glucose-Capillary: 263 mg/dL — ABNORMAL HIGH (ref 70–99)

## 2020-04-02 LAB — C-REACTIVE PROTEIN: CRP: 0.6 mg/dL (ref <1.0)

## 2020-04-02 LAB — D-DIMER, QUANTITATIVE: D-Dimer, Quant: 0.84 ug/mL-FEU — ABNORMAL HIGH (ref 0.00–0.50)

## 2020-04-02 LAB — FERRITIN: Ferritin: 574 ng/mL — ABNORMAL HIGH (ref 11–307)

## 2020-04-02 NOTE — Progress Notes (Signed)
Inpatient Diabetes Program Recommendations  AACE/ADA: New Consensus Statement on Inpatient Glycemic Control (2015)  Target Ranges:  Prepandial:   less than 140 mg/dL      Peak postprandial:   less than 180 mg/dL (1-2 hours)      Critically ill patients:  140 - 180 mg/dL   Lab Results  Component Value Date   GLUCAP 256 (H) 04/02/2020   HGBA1C 7.9 (H) 04/01/2020    Review of Glycemic Control Results for Urquilla, KAELEN BRENNAN (MRN [id number]) as of 04/02/2020 10:44  Ref. Range 04/01/2020 07:46 04/01/2020 12:19 04/01/2020 17:59 04/02/2020 05:43 04/02/2020 07:49  Glucose-Capillary Latest Ref Range: 70 - 99 mg/dL 222 (H) 167 (H) 195 (H) 263 (H) 256 (H)    Inpatient Diabetes Program Recommendations:    Lantus 22 units daily.  Could administer addition 7 units now.    Will continue to follow while inpatient.  Thank you, Reche Dixon, RN, BSN Diabetes Coordinator Inpatient Diabetes Program [phone] (team pager from 8a-5p)

## 2020-04-02 NOTE — Progress Notes (Signed)
SATURATION QUALIFICATIONS: (This note is used to comply with regulatory documentation for home oxygen)  Patient Saturations on Room Air at Rest = 93%  Patient Saturations on Room Air while Ambulating = 88%  Patient Saturations on 1 Liters of oxygen while Ambulating = 91%  Please briefly explain why patient needs home oxygen:

## 2020-04-02 NOTE — Progress Notes (Signed)
PROGRESS NOTE                                                                                                                                                                                                             Patient Demographics:    Megan Salazar, is a 60 y.o. female, DOB - 1960/04/07, [id number]  Outpatient Primary MD for the patient is Rosita Fire, MD   Admit date - 03/30/2020   LOS - 3  Chief Complaint  Patient presents with  . Covid Positive       Brief Narrative: Patient is a 60 y.o. female with PMHx of DM-2, IBS, asthma-who started having URI symptoms around 1/1-subsequently tested positive for COVID-19 on 1/3-presented to the hospital with worsening shortness of breath-found to have acute hypoxic respiratory failure due to COVID-19 pneumonia.  COVID-19 vaccinated status: Unvaccinated  Significant Events: 1/8>> Admit to [facility] for hypoxia due to COVID-19 pneumonia  Significant studies: 1/8>>Chest x-ray: Bilateral pneumonia.  COVID-19 medications: Steroids: 1/8>> Remdesivir: 1/8>>  Antibiotics: None  Microbiology data: None  Procedures: None  Consults: None  DVT prophylaxis: enoxaparin (LOVENOX) injection 40 mg Start: 03/30/20 2000 SCDs Start: 03/30/20 1901    Subjective:   Feels better-claims that she feels 100% better than how she first came into the hospital.  Still on 1-2 L of oxygen.   Assessment  & Plan :   Acute Hypoxic Resp Failure due to Covid 19 Viral pneumonia: Slowly improving-attempt to titrate of oxygen today-we will need ambulatory O2 sat to see if she requires home O2.  We will finish Remdesivir on 1/12-following which if she remains stable-she will be discharged home on tapering steroids.  Continue supportive care and close monitoring.  Fever: afebrile O2 requirements:  SpO2: 98 % O2 Flow Rate (L/min): 2 L/min FiO2 (%): (!) 2 %   COVID-19 Labs: Recent  Labs    03/30/20 1949 03/31/20 0544 04/01/20 0500 04/02/20 0634  DDIMER  --  0.90* 0.80* 0.84*  FERRITIN  --  542* 501* 574*  LDH 347*  --   --   --   CRP  --  3.1* 1.4* 0.6       Component Value Date/Time   BNP 15.5 03/30/2020 1935    Recent Labs  Lab 03/30/20 1935  PROCALCITON <0.10    No results found for: SARSCOV2NAA  Prone/Incentive Spirometry: encouraged incentive spirometry use 3-4/hour  DM-2 (A1c 7.9 on 1/10) with uncontrolled hyperglycemia due to steroids: Per patient-she manages DM at home with over-the-counter supplements.  CBG stable-continue Lantus 15 units, 4 units of NovoLog with meals and SSI.  Do not anticipate discharging on insulin-suspect that once steroids get tapered down-she will continue to improve on her own.    Recent Labs    04/02/20 0543 04/02/20 0749 04/02/20 1206  GLUCAP 263* 256* 143*    HTN: Controlled-continue lisinopril  IBS-diarrhea predominant: At baseline-we will order Imodium as needed.  Obesity: Estimated body mass index is 36.25 kg/m as calculated from the following:   Height as of this encounter: 5\' 4"  (1.626 m).   Weight as of this encounter: 95.8 kg.    ABG: No results found for: PHART, PCO2ART, PO2ART, HCO3, TCO2, ACIDBASEDEF, O2SAT  Vent Settings: N/A  Condition - Guarded  Family Communication  : Daughter-[phone]-over the phone on 1/11   Code Status :  Full Code  Diet :  Diet Order            Diet heart healthy/carb modified Room service appropriate? Yes; Fluid consistency: Thin  Diet effective now                  Disposition Plan  :   Status is: Inpatient  Remains inpatient appropriate because:Inpatient level of care appropriate due to severity of illness   Dispo: The patient is from: Home              Anticipated d/c is to: Home              Anticipated d/c date is: 2 days              Patient currently is not medically stable to d/c.   Barriers to discharge: Hypoxia  requiring O2 supplementation/complete 5 days of IV Remdesivir  Antimicorbials  :    Anti-infectives (From admission, onward)   Start     Dose/Rate Route Frequency Ordered Stop   03/31/20 1000  remdesivir 100 mg in sodium chloride 0.9 % 100 mL IVPB       "Followed by" Linked Group Details   100 mg 200 mL/hr over 30 Minutes Intravenous Daily 03/30/20 1844 04/04/20 0959   03/30/20 2100  remdesivir 200 mg in sodium chloride 0.9% 250 mL IVPB       "Followed by" Linked Group Details   200 mg 580 mL/hr over 30 Minutes Intravenous Once 03/30/20 1844 03/30/20 2202      Inpatient Medications  Scheduled Meds: . budesonide  1 puff Inhalation BID  . enoxaparin (LOVENOX) injection  40 mg Subcutaneous Q24H  . insulin aspart  0-20 Units Subcutaneous TID WC  . insulin aspart  4 Units Subcutaneous TID WC  . insulin glargine  15 Units Subcutaneous Daily  . lisinopril  5 mg Oral Daily  . methylPREDNISolone (SOLU-MEDROL) injection  40 mg Intravenous Q12H  . montelukast  10 mg Oral QHS  . nystatin  5 mL Oral QID   Continuous Infusions: . remdesivir 100 mg in NS 100 mL 100 mg (04/02/20 0957)   PRN Meds:.acetaminophen, albuterol, loperamide, ondansetron **OR** ondansetron (ZOFRAN) IV, senna-docusate   Time Spent in minutes  25  See all Orders from today for further details   Jeoffrey Massed M.D on 04/02/2020 at 12:15 PM  To page go to [email or web address] - use universal password  [facility] -  Office  [phone]    Objective:  Vitals:   04/02/20 0530 04/02/20 0629 04/02/20 0729 04/02/20 1202  BP: (!) 150/72 (!) 145/65 137/70 (!) 142/73  Pulse: (!) 51 62 60 71  Resp: (!) 27 (!) 23 20 20   Temp: 97.6 F (36.4 C) 98 F (36.7 C) 97.6 F (36.4 C) 98 F (36.7 C)  TempSrc: Oral Oral Oral Oral  SpO2: 90% 94% 95% 98%  Weight:   95.8 kg   Height:  5\' 4"  (1.626 m)      Wt Readings from Last 3 Encounters:  04/02/20 95.8 kg  03/25/20 97.5 kg  10/02/19 100.7 kg     Intake/Output  Summary (Last 24 hours) at 04/02/2020 1215 Last data filed at 04/02/2020 1002 Gross per 24 hour  Intake 300 ml  Output -  Net 300 ml     Physical Exam Gen Exam:Alert awake-not in any distress HEENT:atraumatic, normocephalic Chest: B/L clear to auscultation anteriorly CVS:S1S2 regular Abdomen:soft non tender, non distended Extremities:no edema Neurology: Non focal Skin: no rash   Data Review:    CBC Recent Labs  Lab 03/30/20 1246 03/31/20 0544 04/01/20 0500 04/02/20 0634  WBC 8.5 6.0 11.9* 11.7*  HGB 14.2 13.6 13.8 14.5  HCT 43.8 42.0 41.2 42.5  PLT 201 217 274 321  MCV 90.7 89.7 89.0 89.1  MCH 29.4 29.1 29.8 30.4  MCHC 32.4 32.4 33.5 34.1  RDW 12.4 12.5 12.7 12.5  LYMPHSABS  --  0.8 1.4 1.0  MONOABS  --  0.2 0.5 0.4  EOSABS  --  0.0 0.0 0.0  BASOSABS  --  0.0 0.0 0.0    Chemistries  Recent Labs  Lab 03/30/20 1246 03/31/20 0544 04/01/20 0500 04/02/20 0634  NA 136 137 137 137  K 4.1 4.4 4.4 5.0  CL 97* 102 102 100  CO2 27 22 22 25   GLUCOSE 237* 277* 253* 286*  BUN 13 15 21  24*  CREATININE 0.77 0.79 0.85 0.90  CALCIUM 8.9 8.7* 9.0 9.2  MG  --  1.9 2.3  --   AST  --  21 17 20   ALT  --  18 15 18   ALKPHOS  --  54 57 57  BILITOT  --  0.5 0.7 0.9   ------------------------------------------------------------------------------------------------------------------ No results for input(s): CHOL, HDL, LDLCALC, TRIG, CHOLHDL, LDLDIRECT in the last 72 hours.  Lab Results  Component Value Date   HGBA1C 7.9 (H) 04/01/2020   ------------------------------------------------------------------------------------------------------------------ No results for input(s): TSH, T4TOTAL, T3FREE, THYROIDAB in the last 72 hours.  Invalid input(s): FREET3 ------------------------------------------------------------------------------------------------------------------ Recent Labs    04/01/20 0500 04/02/20 0634  FERRITIN 501* 574*    Coagulation profile No results for  input(s): INR, PROTIME in the last 168 hours.  Recent Labs    04/01/20 0500 04/02/20 0634  DDIMER 0.80* 0.84*    Cardiac Enzymes No results for input(s): CKMB, TROPONINI, MYOGLOBIN in the last 168 hours.  Invalid input(s): CK ------------------------------------------------------------------------------------------------------------------    Component Value Date/Time   BNP 15.5 03/30/2020 1935    Micro Results Recent Results (from the past 240 hour(s))  Covid-19, Flu A+B ([facility])     Status: Abnormal   Collection Time: 03/25/20  4:57 PM   Specimen: Nasopharyngeal   Naso  Result Value Ref Range Status   SARS-CoV-2, NAA Detected (A) Not Detected Final    Comment: Patients who have a positive COVID-19 test result may now have treatment options. Treatment options are available for patients with mild to moderate symptoms and for hospitalized patients. Visit our website at [email or web address] for resources  and information. This nucleic acid amplification test was developed and its performance characteristics determined by [employer]. Nucleic acid amplification tests include RT-PCR and TMA. This test has not been FDA cleared or approved. This test has been authorized by FDA under an Emergency Use Authorization (EUA). This test is only authorized for the duration of time the declaration that circumstances exist justifying the authorization of the emergency use of in vitro diagnostic tests for detection of SARS-CoV-2 virus and/or diagnosis of COVID-19 infection under section 564(b)(1) of the Act, 21 U.S.C. 983JAS-5(K) (1), unless the authorization is terminated or revoked sooner. When diagnostic testing is negativ e, the possibility of a false negative result should be considered in the context of a patient's recent exposures and the presence of clinical signs and symptoms consistent with COVID-19. An individual without symptoms of COVID-19 and who is not  shedding SARS-CoV-2 virus would expect to have a negative (not detected) result in this assay.    Influenza A, NAA Not Detected Not Detected Final   Influenza B, NAA Not Detected Not Detected Final    Radiology Reports DG Chest Portable 1 View  Result Date: 03/30/2020 CLINICAL DATA:  COVID-19 positive.  Shortness of breath. EXAM: PORTABLE CHEST 1 VIEW COMPARISON:  None. FINDINGS: Cardiomediastinal silhouette is normal. No pneumothorax. Mild opacity seen in the left base. Minimal opacity not excluded in the right base. IMPRESSION: Mild opacity in the left base and possible minimal opacity in the right base. Findings worrisome for pneumonia given history. Recommend attention on follow-up. Electronically Signed   By: Dorise Bullion III M.D   On: 03/30/2020 13:46   MM 3D SCREEN BREAST BILATERAL  Result Date: 03/13/2020 CLINICAL DATA:  Screening. EXAM: DIGITAL SCREENING BILATERAL MAMMOGRAM WITH TOMO AND CAD COMPARISON:  Previous exam(s). ACR Breast Density Category a: The breast tissue is almost entirely fatty. FINDINGS: There are no findings suspicious for malignancy. Images were processed with CAD. IMPRESSION: No mammographic evidence of malignancy. A result letter of this screening mammogram will be mailed directly to the patient. RECOMMENDATION: Screening mammogram in one year. (Code:SM-B-01Y) BI-RADS CATEGORY  1: Negative. Electronically Signed   By: Lajean Manes M.D.   On: 03/13/2020 08:33

## 2020-04-02 NOTE — Progress Notes (Signed)
This chaplain responded to the RN page for spiritual care.  The chaplain delivered a Bible and devotional per the Pt. request with the help of the Pt. RN-Greta.  The chaplain attempted a late afternoon F/U spiritual care phone call, not recognizing the Pt. home phone and mobile phone are listed as the same number.  The Pt. daughter-Sarah answered the phone call and stated "my mom turns off her cell phone after she is finished talking."  The chaplain appreciated Sarah's assistance.  This chaplain will F/U with the unit chaplain on Wednesday morning.

## 2020-04-02 NOTE — TOC Transition Note (Signed)
Transition of Care [facility]) - CM/SW Discharge Note   Patient Details  Name: Megan Salazar MRN: [id number] Date of Birth: 12-Oct-1960  [facility] CM/SW Contact:  Carles Collet, RN Phone Number: 04/02/2020, 5:00 PM   Clinical Narrative:   Spoke w patient over the phone. She will need home oxygen for discharge. Patient states that she will have a ride home in the morning from her daughter. She would like to DC between 8-12noon. Oxygen has been ordered through [employer]. A portable O2 concentrator will be delivered to the room this evening or early tomorrow morning.       Barriers to Discharge: Continued Medical Work up   Patient Goals and CMS Choice Patient states their goals for this hospitalization and ongoing recovery are:: to go home      Discharge Placement                       Discharge Plan and Services   Discharge Planning Services: CM Consult            DME Arranged: Oxygen DME Agency: [employer] Date DME Agency Contacted: 04/02/20 Time DME Agency Contacted: [phone] Representative spoke with at DME Agency: [place] ([place] Interventions     Readmission Risk Interventions No flowsheet data found.

## 2020-04-02 NOTE — ED Notes (Signed)
MS Breakfast Ordered 

## 2020-04-03 DIAGNOSIS — J1282 Pneumonia due to coronavirus disease 2019: Secondary | ICD-10-CM | POA: Diagnosis not present

## 2020-04-03 DIAGNOSIS — U071 COVID-19: Secondary | ICD-10-CM | POA: Diagnosis not present

## 2020-04-03 DIAGNOSIS — E119 Type 2 diabetes mellitus without complications: Secondary | ICD-10-CM | POA: Diagnosis not present

## 2020-04-03 DIAGNOSIS — J9601 Acute respiratory failure with hypoxia: Secondary | ICD-10-CM | POA: Diagnosis not present

## 2020-04-03 LAB — COMPREHENSIVE METABOLIC PANEL
ALT: 18 U/L (ref 0–44)
AST: 17 U/L (ref 15–41)
Albumin: 3 g/dL — ABNORMAL LOW (ref 3.5–5.0)
Alkaline Phosphatase: 53 U/L (ref 38–126)
Anion gap: 11 (ref 5–15)
BUN: 28 mg/dL — ABNORMAL HIGH (ref 8–23)
CO2: 22 mmol/L (ref 22–32)
Calcium: 8.9 mg/dL (ref 8.9–10.3)
Chloride: 101 mmol/L (ref 98–111)
Creatinine, Ser: 0.84 mg/dL (ref 0.44–1.00)
GFR, Estimated: >60 mL/min (ref >60)
Glucose, Bld: 249 mg/dL — ABNORMAL HIGH (ref 70–99)
Potassium: 4.6 mmol/L (ref 3.5–5.1)
Sodium: 134 mmol/L — ABNORMAL LOW (ref 135–145)
Total Bilirubin: 1 mg/dL (ref 0.3–1.2)
Total Protein: 6.4 g/dL — ABNORMAL LOW (ref 6.5–8.1)

## 2020-04-03 LAB — CBC WITH DIFFERENTIAL/PLATELET
Abs Immature Granulocytes: 0.1 10*3/uL — ABNORMAL HIGH (ref 0.00–0.07)
Basophils Absolute: 0 10*3/uL (ref 0.0–0.1)
Basophils Relative: 0 %
Eosinophils Absolute: 0 10*3/uL (ref 0.0–0.5)
Eosinophils Relative: 0 %
HCT: 43.1 % (ref 36.0–46.0)
Hemoglobin: 13.8 g/dL (ref 12.0–15.0)
Immature Granulocytes: 1 %
Lymphocytes Relative: 9 %
Lymphs Abs: 1.1 10*3/uL (ref 0.7–4.0)
MCH: 28.9 pg (ref 26.0–34.0)
MCHC: 32 g/dL (ref 30.0–36.0)
MCV: 90.4 fL (ref 80.0–100.0)
Monocytes Absolute: 0.5 10*3/uL (ref 0.1–1.0)
Monocytes Relative: 4 %
Neutro Abs: 10.7 10*3/uL — ABNORMAL HIGH (ref 1.7–7.7)
Neutrophils Relative %: 86 %
Platelets: 309 10*3/uL (ref 150–400)
RBC: 4.77 MIL/uL (ref 3.87–5.11)
RDW: 12.4 % (ref 11.5–15.5)
WBC: 12.4 10*3/uL — ABNORMAL HIGH (ref 4.0–10.5)
nRBC: 0 % (ref 0.0–0.2)

## 2020-04-03 LAB — GLUCOSE, CAPILLARY: Glucose-Capillary: 254 mg/dL — ABNORMAL HIGH (ref 70–99)

## 2020-04-03 LAB — FERRITIN: Ferritin: 507 ng/mL — ABNORMAL HIGH (ref 11–307)

## 2020-04-03 LAB — D-DIMER, QUANTITATIVE: D-Dimer, Quant: 0.8 ug/mL-FEU — ABNORMAL HIGH (ref 0.00–0.50)

## 2020-04-03 LAB — C-REACTIVE PROTEIN: CRP: 0.6 mg/dL (ref <1.0)

## 2020-04-03 MED ORDER — ALBUTEROL SULFATE HFA 108 (90 BASE) MCG/ACT IN AERS
2.0000 | INHALATION_SPRAY | Freq: Four times a day (QID) | RESPIRATORY_TRACT | 0 refills | Status: DC | PRN
Start: 2020-04-03 — End: 2022-01-07

## 2020-04-03 MED ORDER — BENZONATATE 100 MG PO CAPS: 100.0000 mg | ORAL_CAPSULE | Freq: Four times a day (QID) | ORAL | 0 refills | Status: DC | PRN

## 2020-04-03 MED ORDER — PREDNISONE 10 MG PO TABS: ORAL_TABLET | ORAL | 0 refills | Status: DC

## 2020-04-03 NOTE — Progress Notes (Signed)
   04/03/20 0935  Clinical Encounter Type  Visited With Other (Comment) (Spoke with the patient on the phone)  Visit Type Follow-up  Referral From Nurse  Consult/Referral To Chaplain  Chaplain responded. Patient appreciative of receiving a Bible. She will be discharged today. She spoke of her faith and spirituality. She is undecided about getting the covid shot on Feb.17. She is hoping to receive an answer from God. She stated her family is allergic to vaccines. Her sister was hospitalized after receiving covid vaccine so that is not encouraging to her. Chaplain encouraged her to also place value on the advice and care of her doctors. This note was prepared by Jeanine Luz, M.Div..  For questions please contact by phone [phone].

## 2020-04-03 NOTE — Progress Notes (Signed)
Patient was discharged home by MD order; discharged instructions review and give to patient with care notes; IV DIC; oxygen tank and POC were delivered to patient's room; patient will be escorted to the car by nurse tech via wheelchair.

## 2020-04-03 NOTE — Discharge Instructions (Signed)

## 2020-04-03 NOTE — Discharge Summary (Signed)
PATIENT DETAILS Name: Megan Salazar Comes Age: 60 y.o. Sex: female Date of Birth: [birth year] MRN: [id number]. Admitting Physician: Sueanne Margarita, DO UD:[id number], Brandon Melnick, MD  Admit Date: 03/30/2020 Discharge date: 04/03/2020  Recommendations for Outpatient Follow-up:  1. Follow up with PCP in 1-2 weeks 2. Please obtain CMP/CBC in one week 3. Repeat Chest Xray in 4-6 week 4. Please assess at next visit whether patient requires home O2.  Admitted From:  Home  Disposition: Burna: Yes  Equipment/Devices: Oxygen 2L   Discharge Condition: Stable  CODE STATUS: FULL CODE  Diet recommendation:  Diet Order            Diet - low sodium heart healthy           Diet Carb Modified           Diet heart healthy/carb modified Room service appropriate? Yes; Fluid consistency: Thin  Diet effective now                  Brief Narrative: Patient is a 60 y.o. female with PMHx of DM-2, IBS, asthma-who started having URI symptoms around 1/1-subsequently tested positive for COVID-19 on 1/3-presented to the hospital with worsening shortness of breath-found to have acute hypoxic respiratory failure due to COVID-19 pneumonia.  COVID-19 vaccinated status: Unvaccinated  Significant Events: 1/8>> Admit to [facility] for hypoxia due to COVID-19 pneumonia  Significant studies: 1/8>>Chest x-ray: Bilateral pneumonia.  COVID-19 medications: Steroids: 1/8>> Remdesivir: 1/8>>  Antibiotics: None  Microbiology data: None  Procedures: None  Consults: None  Brief Hospital Course: Acute Hypoxic Resp Failure due to Covid 19 Viral pneumonia:  Had mild hypoxemia-treated with steroids and Remdesivir-on room air at rest but requires around 2 L of oxygen with ambulation to maintain O2 saturations.  We will finish Remdesivir today-following which she will be discharged home on tapering steroids.  She will follow-up with her primary care practitioner for further continued  care-PCP to assess whether patient still requires home O2 at next visit-and consider repeating a chest x-ray in 4 to 6 weeks to document resolution of pneumonia.  COVID-19 Labs:  Recent Labs    04/01/20 0500 04/02/20 0634 04/03/20 0144  DDIMER 0.80* 0.84* 0.80*  FERRITIN 501* 574* 507*  CRP 1.4* 0.6 0.6    DM-2 (A1c 7.9 on 1/10) with uncontrolled hyperglycemia due to steroids: Per patient-she manages DM at home with over-the-counter supplements.    While in the hospital-she had hyperglycemia due to steroids-she was managed with Lantus and NovoLog insulin-she is not interested in any therapies on discharge-she will be on low-dose tapering prednisone for the next few days    HTN: Controlled-continue lisinopril  IBS-diarrhea predominant: At baseline-we will order Imodium as needed.  Obesity: Estimated body mass index is 36.25 kg/m as calculated from the following:   Height as of this encounter: 5\' 4"  (1.626 m).   Weight as of this encounter: 95.8 kg.   Discharge Diagnoses:  Active Problems:   Acute hypoxemic respiratory failure due to COVID-19 [facility] : [facility])   Discharge Instructions:    Person Under Monitoring Name: Megan Salazar  Location: [place] [address]   Infection Prevention Recommendations for Individuals Confirmed to have, or Being Evaluated for, 2019 Novel Coronavirus (COVID-19) Infection Who Receive Care at Home  Individuals who are confirmed to have, or are being evaluated for, COVID-19 should follow the prevention steps below until a healthcare provider or local or state health department says they can return to  normal activities.  Stay home except to get medical care You should restrict activities outside your home, except for getting medical care. Do not go to work, school, or public areas, and do not use public transportation or taxis.  Call ahead before visiting your doctor Before your medical appointment, call the  healthcare provider and tell them that you have, or are being evaluated for, COVID-19 infection. This will help the healthcare provider's office take steps to keep other people from getting infected. Ask your healthcare provider to call the local or state health department.  Monitor your symptoms Seek prompt medical attention if your illness is worsening (e.g., difficulty breathing). Before going to your medical appointment, call the healthcare provider and tell them that you have, or are being evaluated for, COVID-19 infection. Ask your healthcare provider to call the local or state health department.  Wear a facemask You should wear a facemask that covers your nose and mouth when you are in the same room with other people and when you visit a healthcare provider. People who live with or visit you should also wear a facemask while they are in the same room with you.  Separate yourself from other people in your home As much as possible, you should stay in a different room from other people in your home. Also, you should use a separate bathroom, if available.  Avoid sharing household items You should not share dishes, drinking glasses, cups, eating utensils, towels, bedding, or other items with other people in your home. After using these items, you should wash them thoroughly with soap and water.  Cover your coughs and sneezes Cover your mouth and nose with a tissue when you cough or sneeze, or you can cough or sneeze into your sleeve. Throw used tissues in a lined trash can, and immediately wash your hands with soap and water for at least 20 seconds or use an alcohol-based hand rub.  Wash your [facility] your hands often and thoroughly with soap and water for at least 20 seconds. You can use an alcohol-based hand sanitizer if soap and water are not available and if your hands are not visibly dirty. Avoid touching your eyes, nose, and mouth with unwashed hands.   Prevention Steps for  Caregivers and Household Members of Individuals Confirmed to have, or Being Evaluated for, COVID-19 Infection Being Cared for in the Home  If you live with, or provide care at home for, a person confirmed to have, or being evaluated for, COVID-19 infection please follow these guidelines to prevent infection:  Follow healthcare provider's instructions Make sure that you understand and can help the patient follow any healthcare provider instructions for all care.  Provide for the patient's basic needs You should help the patient with basic needs in the home and provide support for getting groceries, prescriptions, and other personal needs.  Monitor the patient's symptoms If they are getting sicker, call his or her medical provider and tell them that the patient has, or is being evaluated for, COVID-19 infection. This will help the healthcare provider's office take steps to keep other people from getting infected. Ask the healthcare provider to call the local or state health department.  Limit the number of people who have contact with the patient  If possible, have only one caregiver for the patient.  Other household members should stay in another home or place of residence. If this is not possible, they should stay  in another room, or be separated from the patient  as much as possible. Use a separate bathroom, if available.  Restrict visitors who do not have an essential need to be in the home.  Keep older adults, very young children, and other sick people away from the patient Keep older adults, very young children, and those who have compromised immune systems or chronic health conditions away from the patient. This includes people with chronic heart, lung, or kidney conditions, diabetes, and cancer.  Ensure good ventilation Make sure that shared spaces in the home have good air flow, such as from an air conditioner or an opened window, weather permitting.  Wash your hands  often  Wash your hands often and thoroughly with soap and water for at least 20 seconds. You can use an alcohol based hand sanitizer if soap and water are not available and if your hands are not visibly dirty.  Avoid touching your eyes, nose, and mouth with unwashed hands.  Use disposable paper towels to dry your hands. If not available, use dedicated cloth towels and replace them when they become wet.  Wear a facemask and gloves  Wear a disposable facemask at all times in the room and gloves when you touch or have contact with the patient's blood, body fluids, and/or secretions or excretions, such as sweat, saliva, sputum, nasal mucus, vomit, urine, or feces.  Ensure the mask fits over your nose and mouth tightly, and do not touch it during use.  Throw out disposable facemasks and gloves after using them. Do not reuse.  Wash your hands immediately after removing your facemask and gloves.  If your personal clothing becomes contaminated, carefully remove clothing and launder. Wash your hands after handling contaminated clothing.  Place all used disposable facemasks, gloves, and other waste in a lined container before disposing them with other household waste.  Remove gloves and wash your hands immediately after handling these items.  Do not share dishes, glasses, or other household items with the patient  Avoid sharing household items. You should not share dishes, drinking glasses, cups, eating utensils, towels, bedding, or other items with a patient who is confirmed to have, or being evaluated for, COVID-19 infection.  After the person uses these items, you should wash them thoroughly with soap and water.  Wash laundry thoroughly  Immediately remove and wash clothes or bedding that have blood, body fluids, and/or secretions or excretions, such as sweat, saliva, sputum, nasal mucus, vomit, urine, or feces, on them.  Wear gloves when handling laundry from the patient.  Read and follow  directions on labels of laundry or clothing items and detergent. In general, wash and dry with the warmest temperatures recommended on the label.  Clean all areas the individual has used often  Clean all touchable surfaces, such as counters, tabletops, doorknobs, bathroom fixtures, toilets, phones, keyboards, tablets, and bedside tables, every day. Also, clean any surfaces that may have blood, body fluids, and/or secretions or excretions on them.  Wear gloves when cleaning surfaces the patient has come in contact with.  Use a diluted bleach solution (e.g., dilute bleach with 1 part bleach and 10 parts water) or a household disinfectant with a label that says EPA-registered for coronaviruses. To make a bleach solution at home, add 1 tablespoon of bleach to 1 quart (4 cups) of water. For a larger supply, add  cup of bleach to 1 gallon (16 cups) of water.  Read labels of cleaning products and follow recommendations provided on product labels. Labels contain instructions for safe and effective use of  the cleaning product including precautions you should take when applying the product, such as wearing gloves or eye protection and making sure you have good ventilation during use of the product.  Remove gloves and wash hands immediately after cleaning.  Monitor yourself for signs and symptoms of illness Caregivers and household members are considered close contacts, should monitor their health, and will be asked to limit movement outside of the home to the extent possible. Follow the monitoring steps for close contacts listed on the symptom monitoring form.   ? If you have additional questions, contact your local health department or call the epidemiologist on call at [phone] (available 24/7). ? This guidance is subject to change. For the most up-to-date guidance from CDC, please refer to their website: YouBlogs.pl    Activity:   As tolerated  Discharge Instructions    Call MD for:  difficulty breathing, headache or visual disturbances   Complete by: As directed    Diet - low sodium heart healthy   Complete by: As directed    Diet Carb Modified   Complete by: As directed    Discharge instructions   Complete by: As directed    1.)  21 days of isolation from 03/25/2020  2.)  If you develop worsening shortness of breath-please seek immediate medical attention  3.)  Please ask your primary care practitioner to repeat a two-view chest x-ray in 4 to 6 weeks  4.) please ask your primary care practitioner to reassess whether you require oxygen at the next visit   Follow with Primary MD  Rosita Fire, MD in 1-2 weeks  Please get a complete blood count and chemistry panel checked by your Primary MD at your next visit, and again as instructed by your Primary MD.  Get Medicines reviewed and adjusted: Please take all your medications with you for your next visit with your Primary MD  Laboratory/radiological data: Please request your Primary MD to go over all hospital tests and procedure/radiological results at the follow up, please ask your Primary MD to get all Hospital records sent to his/her office.  In some cases, they will be blood work, cultures and biopsy results pending at the time of your discharge. Please request that your primary care M.D. follows up on these results.  Also Note the following: If you experience worsening of your admission symptoms, develop shortness of breath, life threatening emergency, suicidal or homicidal thoughts you must seek medical attention immediately by calling 911 or calling your MD immediately  if symptoms less severe.  You must read complete instructions/literature along with all the possible adverse reactions/side effects for all the Medicines you take and that have been prescribed to you. Take any new Medicines after you have completely understood and accpet all the possible  adverse reactions/side effects.   Do not drive when taking Pain medications or sleeping medications (Benzodaizepines)  Do not take more than prescribed Pain, Sleep and Anxiety Medications. It is not advisable to combine anxiety,sleep and pain medications without talking with your primary care practitioner  Special Instructions: If you have smoked or chewed Tobacco  in the last 2 yrs please stop smoking, stop any regular Alcohol  and or any Recreational drug use.  Wear Seat belts while driving.  Please note: You were cared for by a hospitalist during your hospital stay. Once you are discharged, your primary care physician will handle any further medical issues. Please note that NO REFILLS for any discharge medications will be authorized once you are  discharged, as it is imperative that you return to your primary care physician (or establish a relationship with a primary care physician if you do not have one) for your post hospital discharge needs so that they can reassess your need for medications and monitor your lab values.   Increase activity slowly   Complete by: As directed      Allergies as of 04/03/2020      Reactions   Equal [aspartame]    PT SAID SHE IS HIGHLY ALLERGIC TO ARTIFICIAL SWEETNERS. GETS DIARRHEA EXTREMELY BAD AND HIVES.    Codeine Nausea And Vomiting   Liquid codeine   Amoxicillin    Soy Allergy    All soy products   Sulfa Drugs Cross Reactors Nausea And Vomiting   Other Rash   Rubber/glue      Medication List    STOP taking these medications   ondansetron 4 MG disintegrating tablet Commonly known as: Zofran ODT     TAKE these medications   albuterol 108 (90 Base) MCG/ACT inhaler Commonly known as: VENTOLIN HFA Inhale 2 puffs into the lungs every 6 (six) hours as needed for wheezing or shortness of breath.   benzonatate 100 MG capsule Commonly known as: Tessalon Perles Take 1 capsule (100 mg total) by mouth every 6 (six) hours as needed for cough.    CALCIUM-MAGNESIUM-VITAMIN D PO Take 1 tablet by mouth daily.   Charcoal Activated 260 MG Caps Take 260 mg by mouth See admin instructions. Every 10 days   CINNAMON PO Take 1,000 mg by mouth daily.   CoQ10 200 MG Caps Take 200 mg by mouth daily.   DRY EYES OP Place 1 drop into both eyes daily as needed (dry eyes).   FLAXSEED OIL PO Take 1,200 mg by mouth daily.   fluticasone 110 MCG/ACT inhaler Commonly known as: FLOVENT HFA Inhale 1 puff into the lungs 2 (two) times daily as needed (shortness of breath).   lisinopril 10 MG tablet Commonly known as: ZESTRIL Take 5 mg by mouth in the morning and at bedtime.   montelukast 10 MG tablet Commonly known as: SINGULAIR Take 10 mg by mouth at bedtime.   nystatin 100000 UNIT/ML suspension Commonly known as: MYCOSTATIN Take 5 mLs (500,000 Units total) by mouth 4 (four) times daily.   OLIVE LEAF PO Take 1 capsule by mouth daily.   OVER THE COUNTER MEDICATION Take 1 capsule by mouth every evening. Doterra deep blue   PATADAY OP Apply 1 drop to eye as needed (allergies).   predniSONE 10 MG tablet Commonly known as: DELTASONE Take 40 mg daily for 1 day, 30 mg daily for 1 day, 20 mg daily for 1 days,10 mg daily for 1 day, then stop   TART CHERRY ADVANCED PO Take 1 capsule by mouth daily.   TURMERIC PO Take 1,000 mg by mouth daily.   VITAMIN B-12 PO Take 1,000 mcg by mouth daily.            Durable Medical Equipment  (From admission, onward)         Start     Ordered   04/02/20 1603  For home use only DME oxygen  Once       Question Answer Comment  Length of Need 6 Months   Mode or (Route) Nasal cannula   Liters per Minute 2   Frequency Continuous (stationary and portable oxygen unit needed)   Oxygen conserving device Yes   Oxygen delivery system Gas  04/02/20 1602          Follow-up Information    Rosita Fire, MD. Schedule an appointment as soon as possible for a visit in 1 week(s).    Specialty: Internal Medicine Contact information: [place] [address] [phone]              Allergies  Allergen Reactions  . Equal [Aspartame]     PT SAID SHE IS HIGHLY ALLERGIC TO ARTIFICIAL SWEETNERS. GETS DIARRHEA EXTREMELY BAD AND HIVES.   Marland Kitchen Codeine Nausea And Vomiting    Liquid codeine  . Amoxicillin   . Soy Allergy     All soy products  . Sulfa Drugs Cross Reactors Nausea And Vomiting  . Other Rash    Rubber/glue     Other Procedures/Studies: DG Chest Portable 1 View  Result Date: 03/30/2020 CLINICAL DATA:  COVID-19 positive.  Shortness of breath. EXAM: PORTABLE CHEST 1 VIEW COMPARISON:  None. FINDINGS: Cardiomediastinal silhouette is normal. No pneumothorax. Mild opacity seen in the left base. Minimal opacity not excluded in the right base. IMPRESSION: Mild opacity in the left base and possible minimal opacity in the right base. Findings worrisome for pneumonia given history. Recommend attention on follow-up. Electronically Signed   By: Dorise Bullion III M.D   On: 03/30/2020 13:46   MM 3D SCREEN BREAST BILATERAL  Result Date: 03/13/2020 CLINICAL DATA:  Screening. EXAM: DIGITAL SCREENING BILATERAL MAMMOGRAM WITH TOMO AND CAD COMPARISON:  Previous exam(s). ACR Breast Density Category a: The breast tissue is almost entirely fatty. FINDINGS: There are no findings suspicious for malignancy. Images were processed with CAD. IMPRESSION: No mammographic evidence of malignancy. A result letter of this screening mammogram will be mailed directly to the patient. RECOMMENDATION: Screening mammogram in one year. (Code:SM-B-01Y) BI-RADS CATEGORY  1: Negative. Electronically Signed   By: Lajean Manes M.D.   On: 03/13/2020 08:33     TODAY-DAY OF DISCHARGE:  Subjective:   Joelene Millin Obara today has no headache,no chest abdominal pain,no new weakness tingling or numbness, feels much better wants to go home today.   Objective:   Blood pressure  110/61, pulse 70, temperature 98 F (36.7 C), temperature source Oral, resp. rate 20, height 5\' 4"  (1.626 m), weight 95.8 kg, SpO2 92 %.  Intake/Output Summary (Last 24 hours) at 04/03/2020 1007 Last data filed at 04/03/2020 0912 Gross per 24 hour  Intake 400 ml  Output --  Net 400 ml   Filed Weights   04/02/20 0729  Weight: 95.8 kg    Exam: Awake Alert, Oriented *3, No new F.N deficits, Normal affect [place].AT,PERRAL Supple Neck,No JVD, No cervical lymphadenopathy appriciated.  Symmetrical Chest wall movement, Good air movement bilaterally, CTAB RRR,No Gallops,Rubs or new Murmurs, No Parasternal Heave +ve B.Sounds, Abd Soft, Non tender, No organomegaly appriciated, No rebound -guarding or rigidity. No Cyanosis, Clubbing or edema, No new Rash or bruise   PERTINENT RADIOLOGIC STUDIES: DG Chest Portable 1 View  Result Date: 03/30/2020 CLINICAL DATA:  COVID-19 positive.  Shortness of breath. EXAM: PORTABLE CHEST 1 VIEW COMPARISON:  None. FINDINGS: Cardiomediastinal silhouette is normal. No pneumothorax. Mild opacity seen in the left base. Minimal opacity not excluded in the right base. IMPRESSION: Mild opacity in the left base and possible minimal opacity in the right base. Findings worrisome for pneumonia given history. Recommend attention on follow-up. Electronically Signed   By: Dorise Bullion III M.D   On: 03/30/2020 13:46   MM 3D SCREEN BREAST BILATERAL  Result Date:  03/13/2020 CLINICAL DATA:  Screening. EXAM: DIGITAL SCREENING BILATERAL MAMMOGRAM WITH TOMO AND CAD COMPARISON:  Previous exam(s). ACR Breast Density Category a: The breast tissue is almost entirely fatty. FINDINGS: There are no findings suspicious for malignancy. Images were processed with CAD. IMPRESSION: No mammographic evidence of malignancy. A result letter of this screening mammogram will be mailed directly to the patient. RECOMMENDATION: Screening mammogram in one year. (Code:SM-B-01Y) BI-RADS CATEGORY  1: Negative.  Electronically Signed   By: Lajean Manes M.D.   On: 03/13/2020 08:33     PERTINENT LAB RESULTS: CBC: Recent Labs    04/02/20 0634 04/03/20 0144  WBC 11.7* 12.4*  HGB 14.5 13.8  HCT 42.5 43.1  PLT 321 309   CMET CMP     Component Value Date/Time   NA 134 (L) 04/03/2020 0144   NA 135 03/25/2020 1733   K 4.6 04/03/2020 0144   CL 101 04/03/2020 0144   CO2 22 04/03/2020 0144   GLUCOSE 249 (H) 04/03/2020 0144   BUN 28 (H) 04/03/2020 0144   BUN 10 03/25/2020 1733   CREATININE 0.84 04/03/2020 0144   CALCIUM 8.9 04/03/2020 0144   PROT 6.4 (L) 04/03/2020 0144   PROT 6.8 03/25/2020 1733   ALBUMIN 3.0 (L) 04/03/2020 0144   ALBUMIN 4.0 03/25/2020 1733   AST 17 04/03/2020 0144   ALT 18 04/03/2020 0144   ALKPHOS 53 04/03/2020 0144   BILITOT 1.0 04/03/2020 0144   BILITOT 0.7 03/25/2020 1733   GFRNONAA >60 04/03/2020 0144   GFRAA 91 03/25/2020 1733    GFR Estimated Creatinine Clearance: 78.9 mL/min (by C-G formula based on SCr of 0.84 mg/dL). No results for input(s): LIPASE, AMYLASE in the last 72 hours. No results for input(s): CKTOTAL, CKMB, CKMBINDEX, TROPONINI in the last 72 hours. Invalid input(s): POCBNP Recent Labs    04/02/20 0634 04/03/20 0144  DDIMER 0.84* 0.80*   Recent Labs    04/01/20 0500  HGBA1C 7.9*   No results for input(s): CHOL, HDL, LDLCALC, TRIG, CHOLHDL, LDLDIRECT in the last 72 hours. No results for input(s): TSH, T4TOTAL, T3FREE, THYROIDAB in the last 72 hours.  Invalid input(s): FREET3 Recent Labs    04/02/20 0634 04/03/20 0144  FERRITIN 574* 507*   Coags: No results for input(s): INR in the last 72 hours.  Invalid input(s): PT Microbiology: Recent Results (from the past 240 hour(s))  Covid-19, Flu A+B ([facility])     Status: Abnormal   Collection Time: 03/25/20  4:57 PM   Specimen: Nasopharyngeal   Naso  Result Value Ref Range Status   SARS-CoV-2, NAA Detected (A) Not Detected Final    Comment: Patients who have a positive  COVID-19 test result may now have treatment options. Treatment options are available for patients with mild to moderate symptoms and for hospitalized patients. Visit our website at [email or web address] for resources and information. This nucleic acid amplification test was developed and its performance characteristics determined by [employer]. Nucleic acid amplification tests include RT-PCR and TMA. This test has not been FDA cleared or approved. This test has been authorized by FDA under an Emergency Use Authorization (EUA). This test is only authorized for the duration of time the declaration that circumstances exist justifying the authorization of the emergency use of in vitro diagnostic tests for detection of SARS-CoV-2 virus and/or diagnosis of COVID-19 infection under section 564(b)(1) of the Act, 21 U.S.C. GF:7541899) (1), unless the authorization is terminated or revoked sooner. When diagnostic testing is negativ e, the possibility of a  false negative result should be considered in the context of a patient's recent exposures and the presence of clinical signs and symptoms consistent with COVID-19. An individual without symptoms of COVID-19 and who is not shedding SARS-CoV-2 virus would expect to have a negative (not detected) result in this assay.    Influenza A, NAA Not Detected Not Detected Final   Influenza B, NAA Not Detected Not Detected Final    FURTHER DISCHARGE INSTRUCTIONS:  Get Medicines reviewed and adjusted: Please take all your medications with you for your next visit with your Primary MD  Laboratory/radiological data: Please request your Primary MD to go over all hospital tests and procedure/radiological results at the follow up, please ask your Primary MD to get all Hospital records sent to his/her office.  In some cases, they will be blood work, cultures and biopsy results pending at the time of your discharge. Please request that your  primary care M.D. goes through all the records of your hospital data and follows up on these results.  Also Note the following: If you experience worsening of your admission symptoms, develop shortness of breath, life threatening emergency, suicidal or homicidal thoughts you must seek medical attention immediately by calling 911 or calling your MD immediately  if symptoms less severe.  You must read complete instructions/literature along with all the possible adverse reactions/side effects for all the Medicines you take and that have been prescribed to you. Take any new Medicines after you have completely understood and accpet all the possible adverse reactions/side effects.   Do not drive when taking Pain medications or sleeping medications (Benzodaizepines)  Do not take more than prescribed Pain, Sleep and Anxiety Medications. It is not advisable to combine anxiety,sleep and pain medications without talking with your primary care practitioner  Special Instructions: If you have smoked or chewed Tobacco  in the last 2 yrs please stop smoking, stop any regular Alcohol  and or any Recreational drug use.  Wear Seat belts while driving.  Please note: You were cared for by a hospitalist during your hospital stay. Once you are discharged, your primary care physician will handle any further medical issues. Please note that NO REFILLS for any discharge medications will be authorized once you are discharged, as it is imperative that you return to your primary care physician (or establish a relationship with a primary care physician if you do not have one) for your post hospital discharge needs so that they can reassess your need for medications and monitor your lab values.  Total Time spent coordinating discharge including counseling, education and face to face time equals 35 minutes.  SignedOren Binet 04/03/2020 10:07 AM

## 2020-04-23 DIAGNOSIS — U071 COVID-19: Secondary | ICD-10-CM | POA: Diagnosis not present

## 2020-05-27 ENCOUNTER — Encounter: Payer: Self-pay | Admitting: Internal Medicine

## 2020-06-03 ENCOUNTER — Telehealth: Payer: Self-pay

## 2020-06-03 NOTE — Telephone Encounter (Addendum)
Called pt and she informed me that she has issues with sedation.  She informed me that if she is very heavily sedated that she probably wouldn't wake for "48 hours". She also informed me that when she takes ASA that it makes her very sleepy for long periods of time.   Informed her that she would require ov based on this information.  She scheduled ov for 07/04/2020 at 3:30 with Walden Field, NP.

## 2020-06-03 NOTE — Telephone Encounter (Signed)
Pt called to schedule her nurse visit for her TCS. Pt received a letter to schedule.

## 2020-07-04 ENCOUNTER — Ambulatory Visit: Payer: Medicaid Other | Admitting: Nurse Practitioner

## 2020-07-04 ENCOUNTER — Other Ambulatory Visit: Payer: Self-pay

## 2020-07-04 ENCOUNTER — Encounter: Payer: Self-pay | Admitting: Nurse Practitioner

## 2020-07-04 DIAGNOSIS — Z86010 Personal history of colonic polyps: Secondary | ICD-10-CM

## 2020-07-04 NOTE — Progress Notes (Signed)
Referring Provider: Rosita Fire, MD Primary Care Physician:  Rosita Fire, MD Primary GI:  Dr. Abbey Chatters  Chief Complaint  Patient presents with  . Consult    Due for TCS    HPI:   Megan Salazar is a 60 y.o. female who presents to schedule colonoscopy.  Previous colonoscopy completed 07/19/2010 which found a single 3 mm sessile ascending colon polyp, scattered diverticula, otherwise normal.  Recommended repeat colonoscopy in 10 years.  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  Today she states she is doing okay overall. Denies abdominal pain, N/V, hematochezia, melena, fever, chills, unintentional weight loss. Denies URI or flu-like symptoms. Denies loss of sense of taste or smell. The patient has not received COVID-19 vaccination(s). They are "allergic to all vaccines". Denies chest pain, dyspnea, dizziness, lightheadedness, syncope, near syncope. Denies any other upper or lower GI symptoms.  Past Medical History:  Diagnosis Date  . Asthma   . Bone spur   . Diabetes ([place])   . HTN (hypertension)   . IBS (irritable bowel syndrome)    chronic diarrhea  . Paresthesias    left side  . Sciatica     Past Surgical History:  Procedure Laterality Date  . ABDOMINAL HYSTERECTOMY     partial  . CARPAL TUNNEL RELEASE     [place], [place]  . CESAREAN SECTION    . COLONOSCOPY  07/18/2010   Fields-SIMPLE ADENOMA (Next colonoscopy  06/2020)    Current Outpatient Medications  Medication Sig Dispense Refill  . albuterol (VENTOLIN HFA) 108 (90 Base) MCG/ACT inhaler Inhale 2 puffs into the lungs every 6 (six) hours as needed for wheezing or shortness of breath. 6.7 g 0  . Artificial Tear Ointment (DRY EYES OP) Place 1 drop into both eyes daily as needed (dry eyes).    . benzonatate (TESSALON PERLES) 100 MG capsule Take 1 capsule (100 mg total) by mouth every 6 (six) hours as needed for cough. 30 capsule 0  . CALCIUM-MAGNESIUM-VITAMIN D PO Take 1 tablet by mouth  daily.    . Charcoal Activated 260 MG CAPS Take 260 mg by mouth See admin instructions. Every 10 days    . CINNAMON PO Take 1,000 mg by mouth daily.    . Coenzyme Q10 (COQ10) 200 MG CAPS Take 200 mg by mouth daily.    . Cyanocobalamin (VITAMIN B-12 PO) Take 1,000 mcg by mouth daily.    . Flaxseed, Linseed, (FLAXSEED OIL PO) Take 1,200 mg by mouth daily.    . fluticasone (FLOVENT HFA) 110 MCG/ACT inhaler Inhale 1 puff into the lungs 2 (two) times daily as needed (shortness of breath).    Marland Kitchen lisinopril (ZESTRIL) 10 MG tablet Take 5 mg by mouth in the morning and at bedtime.    . Misc Natural Products (TART CHERRY ADVANCED PO) Take 1 capsule by mouth daily.    . montelukast (SINGULAIR) 10 MG tablet Take 10 mg by mouth at bedtime.    . Multiple Vitamins-Minerals (ZINC PO) Take by mouth. 3 times a week    . NON FORMULARY Basil leaf once daily    . nystatin (MYCOSTATIN) 100000 UNIT/ML suspension Take 5 mLs (500,000 Units total) by mouth 4 (four) times daily. 60 mL 0  . OLIVE LEAF PO Take 1 capsule by mouth daily.    . Olopatadine HCl (PATADAY OP) Apply 1 drop to eye as needed (allergies).    Marland Kitchen OVER THE COUNTER MEDICATION Take 1 capsule by mouth every evening. Derek Jack  deep blue    . predniSONE (DELTASONE) 10 MG tablet Take 40 mg daily for 1 day, 30 mg daily for 1 day, 20 mg daily for 1 days,10 mg daily for 1 day, then stop 10 tablet 0  . TURMERIC PO Take 1,000 mg by mouth daily.     No current facility-administered medications for this visit.    Allergies as of 07/04/2020 - Review Complete 07/04/2020  Allergen Reaction Noted  . Equal [aspartame]  07/03/2010  . Codeine Nausea And Vomiting 06/30/2010  . Amoxicillin  07/31/2015  . Soy allergy  01/19/2018  . Sulfa drugs cross reactors Nausea And Vomiting 06/30/2010  . Other Rash 03/30/2020    Family History  Problem Relation Age of Onset  . Coronary artery disease Father   . Diabetes Father   . Brain cancer Brother   . Diverticulitis Sister    . Colon cancer Neg Hx   . Colon polyps Neg Hx     Social History   Socioeconomic History  . Marital status: Divorced    Spouse name: Not on file  . Number of children: 1  . Years of education: Not on file  . Highest education level: Not on file  Occupational History  . Occupation: Disabled, previous [employer]-1989  Tobacco Use  . Smoking status: Never Smoker  . Smokeless tobacco: Never Used  Vaping Use  . Vaping Use: Former  Substance and Sexual Activity  . Alcohol use: Yes    Comment: occas  . Drug use: No  . Sexual activity: Not Currently  Other Topics Concern  . Not on file  Social History Narrative   MOTHER PASSED AT AGE 54. BROTHER PASSED AT AGE 37. LIVING ON SSI/[facility] AND EX-HUSBANDS PENSION.   Social Determinants of Health   Financial Resource Strain: Not on file  Food Insecurity: Not on file  Transportation Needs: Not on file  Physical Activity: Not on file  Stress: Not on file  Social Connections: Not on file    Subjective: Review of Systems  Constitutional: Negative for chills, fever, malaise/fatigue and weight loss.  HENT: Negative for congestion and sore throat.   Respiratory: Negative for cough and shortness of breath.   Cardiovascular: Negative for chest pain and palpitations.  Gastrointestinal: Negative for abdominal pain, blood in stool, diarrhea, melena, nausea and vomiting.  Musculoskeletal: Negative for joint pain and myalgias.  Skin: Negative for rash.  Neurological: Negative for dizziness and weakness.  Endo/Heme/Allergies: Does not bruise/bleed easily.  Psychiatric/Behavioral: Negative for depression. The patient is not nervous/anxious.   All other systems reviewed and are negative.    Objective: BP (!) 164/83   Pulse 73   Temp (!) 97.5 F (36.4 C)   Ht _0  (1.626 m)   Wt 216 lb 6.4 oz (98.2 kg)   BMI 37.14 kg/m  Physical Exam Vitals and nursing note reviewed.  Constitutional:      General: She is not in acute distress.     Appearance: Normal appearance. She is well-developed. She is obese. She is not ill-appearing, toxic-appearing or diaphoretic.  HENT:     Head: Normocephalic and atraumatic.     Nose: No congestion or rhinorrhea.  Eyes:     General: No scleral icterus. Cardiovascular:     Rate and Rhythm: Normal rate and regular rhythm.     Heart sounds: Normal heart sounds.  Pulmonary:     Effort: Pulmonary effort is normal. No respiratory distress.     Breath sounds: Normal breath sounds.  Abdominal:     General: Bowel sounds are normal.     Palpations: Abdomen is soft. There is no hepatomegaly, splenomegaly or mass.     Tenderness: There is no abdominal tenderness. There is no guarding or rebound.     Hernia: No hernia is present.  Skin:    General: Skin is warm and dry.     Coloration: Skin is not jaundiced.     Findings: No rash.  Neurological:     General: No focal deficit present.     Mental Status: She is alert and oriented to person, place, and time.  Psychiatric:        Attention and Perception: Attention normal.        Mood and Affect: Mood normal.        Speech: Speech normal.        Behavior: Behavior normal.        Thought Content: Thought content normal.        Cognition and Memory: Cognition and memory normal.      Assessment:  60 year old female presents to schedule a repeat colonoscopy.  Previous colonoscopy 10 years ago as described in HPI.  History of colon polyps.  Currently due for colonoscopy.  We will proceed at this time.  Generally asymptomatic from a GI standpoint.   Proceed with TCS on propofol/MAC with Dr. Abbey Chatters on propofol/MAC in near future: the risks, benefits, and alternatives have been discussed with the patient in detail. The patient states understanding and desires to proceed.  ASA 3   Plan: 1. Colonoscopy as described above 2. Follow-up based on post procedure recommendations, or as needed for GI complaints    Thank you for allowing [place] to  participate in the care of [place], DNP, AGNP-C Adult & Gerontological Nurse Practitioner [facility]   07/04/2020 3:47 PM   Disclaimer: This note was dictated with voice recognition software. Similar sounding words can inadvertently be transcribed and may not be corrected upon review.

## 2020-07-04 NOTE — Patient Instructions (Addendum)
Your health issues we discussed today were:   History of colon polyps/need for colonoscopy: 1. We will schedule you for your colonoscopy 2. Further recommendations will follow your colonoscopy 3. Call us if you have any questions or problems with your bowel prep  Overall I recommend:  1. Continue your other current medications 2. Return for follow-up based on post-procedure recommendations or for any GI symptoms 3. Call us if you have any questions or concerns   At [facility] we value your feedback. You may receive a survey about your visit today. Please share your experience as we strive to create trusting relationships with our patients to provide genuine, compassionate, quality care.  We appreciate your understanding and patience as we review any laboratory studies, imaging, and other diagnostic tests that are ordered as we care for you. Our office policy is 5 business days for review of these results, and any emergent or urgent results are addressed in a timely manner for your best interest. If you do not hear from our office in 1 week, please contact us.   We also encourage the use of MyChart, which contains your medical information for your review as well. If you are not enrolled in this feature, an access code is on this after visit summary for your convenience. Thank you for allowing [place] to be involved in your care.  It was great to see you today!  I hope you have a great spring and happy Easter!!

## 2020-07-08 ENCOUNTER — Other Ambulatory Visit: Payer: Self-pay

## 2020-07-08 MED ORDER — CLENPIQ 10-3.5-12 MG-GM -GM/160ML PO SOLN: 1.0000 | Freq: Once | ORAL | 0 refills | Status: AC

## 2020-07-25 ENCOUNTER — Other Ambulatory Visit: Payer: Self-pay

## 2020-07-25 ENCOUNTER — Telehealth: Payer: Self-pay | Admitting: Internal Medicine

## 2020-07-25 MED ORDER — CLENPIQ 10-3.5-12 MG-GM -GM/160ML PO SOLN: 1.0000 | Freq: Once | ORAL | 0 refills | Status: AC

## 2020-07-25 NOTE — Telephone Encounter (Signed)
If she wants "no sedation" that's fine. I'll cc: Dr. Abbey Chatters so he can be aware.

## 2020-07-25 NOTE — Telephone Encounter (Signed)
Pt said she called [facility] about getting her prep prescription but was told nothing was on file for her. She asked for [place] to send it back into them and she will pick up tomorrow or Saturday.

## 2020-07-25 NOTE — Telephone Encounter (Signed)
Dr. Abbey Chatters, before I call patient is it okay to let patient know that no sedation will be used?

## 2020-07-25 NOTE — Telephone Encounter (Signed)
Patient aware.

## 2020-07-25 NOTE — Telephone Encounter (Signed)
Fowarding to [employer]

## 2020-07-25 NOTE — Telephone Encounter (Signed)
Please call patient, she said that aspirin makes her fall asleep and last time dr fields did her tcs they just "laid her on the table and did what they had to do" said she prefers it that way and until she came here all hers were done without sedation

## 2020-07-25 NOTE — Telephone Encounter (Signed)
Resent rx for prep to pharmacy. Called and informed pt.

## 2020-07-25 NOTE — Telephone Encounter (Signed)
LMOVM for pt.  Called endo to let Threasa Beards know to make a note. Dr. Abbey Chatters needs to speak with anesthesiology

## 2020-07-25 NOTE — Telephone Encounter (Signed)
I guess so. I would still like to have anesthesia there just in case she cannot tolerate procedure and we end up having to use sedation. Thanks

## 2020-08-13 NOTE — Patient Instructions (Signed)
Megan Salazar  08/13/2020     @PREFPERIOPPHARMACY @   Your procedure is scheduled on  08/20/2020.   Report to [facility] at  0700  A.M.   Call this number if you have problems the morning of surgery:  [phone]   Remember:  Follow the diet and prep instructions given to you by the office.                      Take these medicines the morning of surgery with A SIP OF WATER    None.     Please brush your teeth.  Do not wear jewelry, make-up or nail polish.  Do not wear lotions, powders, or perfumes, or deodorant.  Do not shave 48 hours prior to surgery.  Men may shave face and neck.  Do not bring valuables to the hospital.  [facility] is not responsible for any belongings or valuables.  Contacts, dentures or bridgework may not be worn into surgery.  Leave your suitcase in the car.  After surgery it may be brought to your room.  For patients admitted to the hospital, discharge time will be determined by your treatment team.  Patients discharged the day of surgery will not be allowed to drive home and must have someone with them for 24 hours.    Special instructions:  DO NOT smoke tobacco or vape for 24 hours before your procedure.  Please read over the following fact sheets that you were given. Anesthesia Post-op Instructions and Care and Recovery After Surgery       Colonoscopy, Adult, Care After This sheet gives you information about how to care for yourself after your procedure. Your health care provider may also give you more specific instructions. If you have problems or questions, contact your health care provider. What can I expect after the procedure? After the procedure, it is common to have:  A small amount of blood in your stool for 24 hours after the procedure.  Some gas.  Mild cramping or bloating of your abdomen. Follow these instructions at home: Eating and drinking  Drink enough fluid to keep your urine pale yellow.  Follow  instructions from your health care provider about eating or drinking restrictions.  Resume your normal diet as instructed by your health care provider. Avoid heavy or fried foods that are hard to digest.   Activity  Rest as told by your health care provider.  Avoid sitting for a long time without moving. Get up to take short walks every 1-2 hours. This is important to improve blood flow and breathing. Ask for help if you feel weak or unsteady.  Return to your normal activities as told by your health care provider. Ask your health care provider what activities are safe for you. Managing cramping and bloating  Try walking around when you have cramps or feel bloated.  Apply heat to your abdomen as told by your health care provider. Use the heat source that your health care provider recommends, such as a moist heat pack or a heating pad. ? Place a towel between your skin and the heat source. ? Leave the heat on for 20-30 minutes. ? Remove the heat if your skin turns bright red. This is especially important if you are unable to feel pain, heat, or cold. You may have a greater risk of getting burned.   General instructions  If you were given a sedative during the procedure, it  can affect you for several hours. Do not drive or operate machinery until your health care provider says that it is safe.  For the first 24 hours after the procedure: ? Do not sign important documents. ? Do not drink alcohol. ? Do your regular daily activities at a slower pace than normal. ? Eat soft foods that are easy to digest.  Take over-the-counter and prescription medicines only as told by your health care provider.  Keep all follow-up visits as told by your health care provider. This is important. Contact a health care provider if:  You have blood in your stool 2-3 days after the procedure. Get help right away if you have:  More than a small spotting of blood in your stool.  Large blood clots in your  stool.  Swelling of your abdomen.  Nausea or vomiting.  A fever.  Increasing pain in your abdomen that is not relieved with medicine. Summary  After the procedure, it is common to have a small amount of blood in your stool. You may also have mild cramping and bloating of your abdomen.  If you were given a sedative during the procedure, it can affect you for several hours. Do not drive or operate machinery until your health care provider says that it is safe.  Get help right away if you have a lot of blood in your stool, nausea or vomiting, a fever, or increased pain in your abdomen. This information is not intended to replace advice given to you by your health care provider. Make sure you discuss any questions you have with your health care provider. Document Revised: 03/03/2019 Document Reviewed: 10/03/2018 Elsevier Patient Education  2021 [place] After This sheet gives you information about how to care for yourself after your procedure. Your health care provider may also give you more specific instructions. If you have problems or questions, contact your health care provider. What can I expect after the procedure? After the procedure, it is common to have:  Tiredness.  Forgetfulness about what happened after the procedure.  Impaired judgment for important decisions.  Nausea or vomiting.  Some difficulty with balance. Follow these instructions at home: For the time period you were told by your health care provider:  Rest as needed.  Do not participate in activities where you could fall or become injured.  Do not drive or use machinery.  Do not drink alcohol.  Do not take sleeping pills or medicines that cause drowsiness.  Do not make important decisions or sign legal documents.  Do not take care of children on your own.      Eating and drinking  Follow the diet that is recommended by your health care provider.  Drink  enough fluid to keep your urine pale yellow.  If you vomit: ? Drink water, juice, or soup when you can drink without vomiting. ? Make sure you have little or no nausea before eating solid foods. General instructions  Have a responsible adult stay with you for the time you are told. It is important to have someone help care for you until you are awake and alert.  Take over-the-counter and prescription medicines only as told by your health care provider.  If you have sleep apnea, surgery and certain medicines can increase your risk for breathing problems. Follow instructions from your health care provider about wearing your sleep device: ? Anytime you are sleeping, including during daytime naps. ? While taking prescription pain medicines, sleeping medicines,  or medicines that make you drowsy.  Avoid smoking.  Keep all follow-up visits as told by your health care provider. This is important. Contact a health care provider if:  You keep feeling nauseous or you keep vomiting.  You feel light-headed.  You are still sleepy or having trouble with balance after 24 hours.  You develop a rash.  You have a fever.  You have redness or swelling around the IV site. Get help right away if:  You have trouble breathing.  You have new-onset confusion at home. Summary  For several hours after your procedure, you may feel tired. You may also be forgetful and have poor judgment.  Have a responsible adult stay with you for the time you are told. It is important to have someone help care for you until you are awake and alert.  Rest as told. Do not drive or operate machinery. Do not drink alcohol or take sleeping pills.  Get help right away if you have trouble breathing, or if you suddenly become confused. This information is not intended to replace advice given to you by your health care provider. Make sure you discuss any questions you have with your health care provider. Document Revised:  11/23/2019 Document Reviewed: 02/09/2019 Elsevier Patient Education  2021 [employer].

## 2020-08-16 ENCOUNTER — Other Ambulatory Visit: Payer: Self-pay

## 2020-08-16 ENCOUNTER — Encounter (HOSPITAL_COMMUNITY): Payer: Self-pay

## 2020-08-16 ENCOUNTER — Other Ambulatory Visit (HOSPITAL_COMMUNITY)
Admission: RE | Admit: 2020-08-16 | Discharge: 2020-08-16 | Disposition: A | Discharge status: Home / Self Care | Payer: Medicaid Other | Source: Ambulatory Visit | Attending: Internal Medicine | Admitting: Internal Medicine

## 2020-08-16 ENCOUNTER — Encounter (HOSPITAL_COMMUNITY)
Admission: RE | Admit: 2020-08-16 | Discharge: 2020-08-16 | Disposition: A | Discharge status: Home / Self Care | Payer: Medicaid Other | Source: Ambulatory Visit | Attending: Internal Medicine | Admitting: Internal Medicine

## 2020-08-16 DIAGNOSIS — Z20822 Contact with and (suspected) exposure to covid-19: Secondary | ICD-10-CM | POA: Diagnosis not present

## 2020-08-16 DIAGNOSIS — Z01812 Encounter for preprocedural laboratory examination: Secondary | ICD-10-CM | POA: Insufficient documentation

## 2020-08-16 HISTORY — DX: Other complications of anesthesia, initial encounter: T88.59XA

## 2020-08-16 HISTORY — DX: Carpal tunnel syndrome, unspecified upper limb: G56.00

## 2020-08-16 LAB — BASIC METABOLIC PANEL
Anion gap: 13 (ref 5–15)
BUN: 13 mg/dL (ref 8–23)
CO2: 18 mmol/L — ABNORMAL LOW (ref 22–32)
Calcium: 9 mg/dL (ref 8.9–10.3)
Chloride: 105 mmol/L (ref 98–111)
Creatinine, Ser: 0.76 mg/dL (ref 0.44–1.00)
GFR, Estimated: >60 mL/min (ref >60)
Glucose, Bld: 191 mg/dL — ABNORMAL HIGH (ref 70–99)
Potassium: 3.7 mmol/L (ref 3.5–5.1)
Sodium: 136 mmol/L (ref 135–145)

## 2020-08-16 NOTE — Progress Notes (Signed)
Patient refuses anesthesia for Colonoscopy on 08/10/20.  Dr Abbey Chatters and Dr Burtis Junes aware and will attempt to do the procedure without anesthesia.

## 2020-08-17 LAB — SARS CORONAVIRUS 2 (TAT 6-24 HRS): SARS Coronavirus 2: NEGATIVE

## 2020-08-20 ENCOUNTER — Other Ambulatory Visit: Payer: Self-pay

## 2020-08-20 ENCOUNTER — Encounter (HOSPITAL_COMMUNITY)
Admission: RE | Disposition: A | Discharge status: Home / Self Care | Payer: Self-pay | Source: Home / Self Care | Attending: Internal Medicine

## 2020-08-20 ENCOUNTER — Encounter (HOSPITAL_COMMUNITY): Payer: Self-pay

## 2020-08-20 ENCOUNTER — Ambulatory Visit (HOSPITAL_COMMUNITY)
Admission: RE | Admit: 2020-08-20 | Discharge: 2020-08-20 | Disposition: A | Discharge status: Home / Self Care | Payer: Medicaid Other | Attending: Internal Medicine | Admitting: Internal Medicine

## 2020-08-20 DIAGNOSIS — Z1211 Encounter for screening for malignant neoplasm of colon: Secondary | ICD-10-CM | POA: Insufficient documentation

## 2020-08-20 DIAGNOSIS — Z8379 Family history of other diseases of the digestive system: Secondary | ICD-10-CM | POA: Insufficient documentation

## 2020-08-20 DIAGNOSIS — I10 Essential (primary) hypertension: Secondary | ICD-10-CM | POA: Insufficient documentation

## 2020-08-20 DIAGNOSIS — Z79899 Other long term (current) drug therapy: Secondary | ICD-10-CM | POA: Diagnosis not present

## 2020-08-20 DIAGNOSIS — K5730 Diverticulosis of large intestine without perforation or abscess without bleeding: Secondary | ICD-10-CM | POA: Diagnosis not present

## 2020-08-20 DIAGNOSIS — Z885 Allergy status to narcotic agent status: Secondary | ICD-10-CM | POA: Diagnosis not present

## 2020-08-20 DIAGNOSIS — K648 Other hemorrhoids: Secondary | ICD-10-CM | POA: Insufficient documentation

## 2020-08-20 DIAGNOSIS — Z90711 Acquired absence of uterus with remaining cervical stump: Secondary | ICD-10-CM | POA: Diagnosis not present

## 2020-08-20 DIAGNOSIS — Z880 Allergy status to penicillin: Secondary | ICD-10-CM | POA: Insufficient documentation

## 2020-08-20 DIAGNOSIS — R202 Paresthesia of skin: Secondary | ICD-10-CM | POA: Insufficient documentation

## 2020-08-20 DIAGNOSIS — Z882 Allergy status to sulfonamides status: Secondary | ICD-10-CM | POA: Insufficient documentation

## 2020-08-20 DIAGNOSIS — Z8249 Family history of ischemic heart disease and other diseases of the circulatory system: Secondary | ICD-10-CM | POA: Diagnosis not present

## 2020-08-20 DIAGNOSIS — Z98891 History of uterine scar from previous surgery: Secondary | ICD-10-CM | POA: Diagnosis not present

## 2020-08-20 DIAGNOSIS — E119 Type 2 diabetes mellitus without complications: Secondary | ICD-10-CM | POA: Diagnosis not present

## 2020-08-20 HISTORY — PX: COLONOSCOPY WITH PROPOFOL: SHX5780

## 2020-08-20 LAB — GLUCOSE, CAPILLARY: Glucose-Capillary: 167 mg/dL — ABNORMAL HIGH (ref 70–99)

## 2020-08-20 SURGERY — COLONOSCOPY WITH PROPOFOL
Anesthesia: Monitor Anesthesia Care

## 2020-08-20 NOTE — Discharge Instructions (Signed)
°  Colonoscopy °Discharge Instructions ° °Read the instructions outlined below and refer to this sheet in the next few weeks. These discharge instructions provide you with general information on caring for yourself after you leave the hospital. Your doctor may also give you specific instructions. While your treatment has been planned according to the most current medical practices available, unavoidable complications occasionally occur.  ° °ACTIVITY °You may resume your regular activity, but move at a slower pace for the next 24 hours.  °Take frequent rest periods for the next 24 hours.  °Walking will help get rid of the air and reduce the bloated feeling in your belly (abdomen).  °No driving for 24 hours (because of the medicine (anesthesia) used during the test).   °Do not sign any important legal documents or operate any machinery for 24 hours (because of the anesthesia used during the test).  °NUTRITION °Drink plenty of fluids.  °You may resume your normal diet as instructed by your doctor.  °Begin with a light meal and progress to your normal diet. Heavy or fried foods are harder to digest and may make you feel sick to your stomach (nauseated).  °Avoid alcoholic beverages for 24 hours or as instructed.  °MEDICATIONS °You may resume your normal medications unless your doctor tells you otherwise.  °WHAT YOU CAN EXPECT TODAY °Some feelings of bloating in the abdomen.  °Passage of more gas than usual.  °Spotting of blood in your stool or on the toilet paper.  °IF YOU HAD POLYPS REMOVED DURING THE COLONOSCOPY: °No aspirin products for 7 days or as instructed.  °No alcohol for 7 days or as instructed.  °Eat a soft diet for the next 24 hours.  °FINDING OUT THE RESULTS OF YOUR TEST °Not all test results are available during your visit. If your test results are not back during the visit, make an appointment with your caregiver to find out the results. Do not assume everything is normal if you have not heard from your  caregiver or the medical facility. It is important for you to follow up on all of your test results.  °SEEK IMMEDIATE MEDICAL ATTENTION IF: °You have more than a spotting of blood in your stool.  °Your belly is swollen (abdominal distention).  °You are nauseated or vomiting.  °You have a temperature over 101.  °You have abdominal pain or discomfort that is severe or gets worse throughout the day.  ° °Your colonoscopy was relatively unremarkable.  I did not find any polyps or evidence of colon cancer.  I recommend repeating colonoscopy in 10 years for colon cancer screening purposes.  You do have diverticulosis and internal hemorrhoids. I would recommend increasing fiber in your diet or adding OTC Benefiber/Metamucil. Be sure to drink at least 4 to 6 glasses of water daily. Follow-up with GI as needed. ° ° °I hope you have a great rest of your week! ° °Ameliarose Shark K. Braylin Xu, D.O. °Gastroenterology and Hepatology °[facility] ° °

## 2020-08-20 NOTE — Op Note (Signed)
[facility] Patient Name: Megan Salazar Procedure Date: 08/20/2020 8:08 AM MRN: [id number] Date of Birth: Nov 06, 1960 Attending MD: Elon Alas. Abbey Chatters DO CSN: [id number] Age: 60 Admit Type: Outpatient Procedure:                Colonoscopy Indications:              Screening for colorectal malignant neoplasm Providers:                Elon Alas. Abbey Chatters, DO, Janeece Riggers, RN, Kristine L.                            Risa Grill, Technician Referring MD:              Medicines:                None Complications:            No immediate complications. Estimated Blood Loss:     Estimated blood loss: none. Procedure:                Pre-Anesthesia Assessment:                           - The heart rate, respiratory rate, oxygen                            saturations, blood pressure, adequacy of pulmonary                            ventilation, and response to care were monitored                            throughout the procedure.                           After obtaining informed consent, the colonoscope                            was passed under direct vision. Throughout the                            procedure, the patient's blood pressure, pulse, and                            oxygen saturations were monitored continuously. The                            [id number]-[id number] ([id number]) scope was introduced through                            the anus and advanced to the the cecum, identified                            by appendiceal orifice and ileocecal valve. The                            colonoscopy was performed without difficulty. The  patient tolerated the procedure well. The quality                            of the bowel preparation was evaluated using the                            BBPS [facility] Bowel Preparation Scale) with scores                            of: Right Colon = 3, Transverse Colon = 3 and Left                            Colon = 3 (entire mucosa  seen well with no residual                            staining, small fragments of stool or opaque                            liquid). The total BBPS score equals 9. Scope In: 8:20:33 AM Scope Out: 8:35:47 AM Scope Withdrawal Time: 0 hours 9 minutes 34 seconds  Total Procedure Duration: 0 hours 15 minutes 14 seconds  Findings:      The perianal and digital rectal examinations were normal.      Non-bleeding internal hemorrhoids were found during endoscopy.      Scattered small and large-mouthed diverticula were found in the entire       colon.      The exam was otherwise without abnormality. Impression:               - Non-bleeding internal hemorrhoids.                           - Diverticulosis in the entire examined colon.                           - The examination was otherwise normal.                           - No specimens collected. Moderate Sedation:      No sedation given Recommendation:           - Patient has a contact number available for                            emergencies. The signs and symptoms of potential                            delayed complications were discussed with the                            patient. Return to normal activities tomorrow.                            Written discharge instructions were provided to the  patient.                           - Resume previous diet.                           - Continue present medications.                           - Repeat colonoscopy in 10 years for screening                            purposes.                           - Return to GI clinic PRN. Procedure Code(s):        --- Professional ---                           Z1696, Colorectal cancer screening; colonoscopy on                            individual not meeting criteria for high risk Diagnosis Code(s):        --- Professional ---                           Z12.11, Encounter for screening for malignant                             neoplasm of colon                           K64.8, Other hemorrhoids                           K57.30, Diverticulosis of large intestine without                            perforation or abscess without bleeding CPT copyright 2019 American Medical Association. All rights reserved. The codes documented in this report are preliminary and upon coder review may  be revised to meet current compliance requirements. Elon Alas. Abbey Chatters, DO [place] Abbey Chatters, DO 08/20/2020 8:39:30 AM This report has been signed electronically. Number of Addenda: 0

## 2020-08-20 NOTE — OR Nursing (Signed)
Patient tolerated procedure well with no anesthesia Per patient request. Vital  signs remained stable throughout the procedure. Patient talking to Dr during  the procedure, very pleasant.

## 2020-08-20 NOTE — H&P (Signed)
Primary Care Physician:  Rosita Fire, MD Primary Gastroenterologist:  Dr. Abbey Chatters  Pre-Procedure History & Physical: HPI:  Megan Salazar is a 61 y.o. female is here for a colonoscopy for colon cancer screening purposes.  Patient denies any family history of colorectal cancer.  No melena or hematochezia.  No abdominal pain or unintentional weight loss.  No change in bowel habits.  Overall feels well from a GI standpoint.  Past Medical History:  Diagnosis Date  . Asthma   . Bone spur   . Carpal tunnel syndrome   . Complication of anesthesia    Patient didnt wake up for 24 hours after anesthesia  . Diabetes ([place])   . HTN (hypertension)   . IBS (irritable bowel syndrome)    chronic diarrhea  . Paresthesias    left side  . Pneumonia 03/2020  . Sciatica     Past Surgical History:  Procedure Laterality Date  . ABDOMINAL HYSTERECTOMY     partial  . CARPAL TUNNEL RELEASE     bilat, [place]  . CESAREAN SECTION    . COLONOSCOPY  07/18/2010   Fields-SIMPLE ADENOMA (Next colonoscopy  06/2020)    Prior to Admission medications   Medication Sig Start Date End Date Taking? Authorizing Provider  Artificial Tear Ointment (DRY EYES OP) Place 1 drop into both eyes daily as needed (dry eyes).   Yes [provider]  CALCIUM-MAGNESIUM-VITAMIN D PO Take 1 tablet by mouth daily.   Yes [provider]  Charcoal Activated 260 MG CAPS Take 260 mg by mouth See admin instructions. Every 10 days   Yes [provider]  CINNAMON PO Take 1,000 mg by mouth daily.   Yes [provider]  Coenzyme Q10 (COQ10) 200 MG CAPS Take 200 mg by mouth daily.   Yes [provider]  Cyanocobalamin (VITAMIN B-12 PO) Take 500 mcg by mouth daily.   Yes [provider]  Flaxseed, Linseed, (FLAXSEED OIL PO) Take 1,000 mg by mouth daily.   Yes [provider]  fluticasone (FLOVENT HFA) 110 MCG/ACT inhaler Inhale 1 puff into the lungs 2 (two) times daily as  needed (shortness of breath).   Yes [provider]  lisinopril (ZESTRIL) 10 MG tablet Take 5 mg by mouth in the morning and at bedtime. 09/11/19  Yes [provider]  Menthol-Methyl Salicylate (MUSCLE RUB) 10-15 % CREA Apply 1 application topically as needed for muscle pain.   Yes [provider]  Misc Natural Products (TART CHERRY ADVANCED PO) Take 1 capsule by mouth daily.   Yes [provider]  montelukast (SINGULAIR) 10 MG tablet Take 10 mg by mouth at bedtime.   Yes [provider]  OVER THE COUNTER MEDICATION Take 1 capsule by mouth every evening. Doterra deep blue   Yes [provider]  Probiotic Product (PROBIOTIC DAILY PO) Take 1 each by mouth daily. Gummy   Yes [provider]  TURMERIC PO Take 1,000 mg by mouth daily.   Yes [provider]  albuterol (VENTOLIN HFA) 108 (90 Base) MCG/ACT inhaler Inhale 2 puffs into the lungs every 6 (six) hours as needed for wheezing or shortness of breath. 04/03/20   Ghimire, Henreitta Leber, MD  benzonatate (TESSALON PERLES) 100 MG capsule Take 1 capsule (100 mg total) by mouth every 6 (six) hours as needed for cough. Patient not taking: No sig reported 04/03/20 04/03/21  Jonetta Osgood, MD  nystatin (MYCOSTATIN) 100000 UNIT/ML suspension Take 5 mLs (500,000 Units total) by  mouth 4 (four) times daily. Patient not taking: Reported on 08/09/2020 03/25/20   Faustino Congress, NP  predniSONE (DELTASONE) 10 MG tablet Take 40 mg daily for 1 day, 30 mg daily for 1 day, 20 mg daily for 1 days,10 mg daily for 1 day, then stop Patient not taking: No sig reported 04/03/20   Jonetta Osgood, MD    Allergies as of 07/08/2020 - Review Complete 07/04/2020  Allergen Reaction Noted  . Equal [aspartame]  07/03/2010  . Codeine Nausea And Vomiting 06/30/2010  . Amoxicillin  07/31/2015  . Soy allergy  01/19/2018  . Sulfa drugs cross reactors Nausea And Vomiting 06/30/2010  . Other Rash 03/30/2020     Family History  Problem Relation Age of Onset  . Coronary artery disease Father   . Diabetes Father   . Brain cancer Brother   . Diverticulitis Sister   . Colon cancer Neg Hx   . Colon polyps Neg Hx     Social History   Socioeconomic History  . Marital status: Divorced    Spouse name: Not on file  . Number of children: 1  . Years of education: Not on file  . Highest education level: Not on file  Occupational History  . Occupation: Disabled, previous [employer]-1989  Tobacco Use  . Smoking status: Never Smoker  . Smokeless tobacco: Never Used  Vaping Use  . Vaping Use: Former  Substance and Sexual Activity  . Alcohol use: Yes    Comment: occas  . Drug use: No  . Sexual activity: Not Currently  Other Topics Concern  . Not on file  Social History Narrative   MOTHER PASSED AT AGE 31. BROTHER PASSED AT AGE 97. LIVING ON SSI/VA AND EX-HUSBANDS PENSION.   Social Determinants of Health   Financial Resource Strain: Not on file  Food Insecurity: Not on file  Transportation Needs: Not on file  Physical Activity: Not on file  Stress: Not on file  Social Connections: Not on file  Intimate Partner Violence: Not on file    Review of Systems: See HPI, otherwise negative ROS  Physical Exam: Vital signs in last 24 hours: Temp:  [98.3 F (36.8 C)] 98.3 F (36.8 C) (05/31 0757) Pulse Rate:  [70] 70 (05/31 0757) Resp:  [22] 22 (05/31 0757) BP: (149)/(80) 149/80 (05/31 0757) SpO2:  [96 %] 96 % (05/31 0757)   General:   Alert,  Well-developed, well-nourished, pleasant and cooperative in NAD Head:  Normocephalic and atraumatic. Eyes:  Sclera clear, no icterus.   Conjunctiva pink. Ears:  Normal auditory acuity. Nose:  No deformity, discharge,  or lesions. Mouth:  No deformity or lesions, dentition normal. Neck:  Supple; no masses or thyromegaly. Lungs:  Clear throughout to auscultation.   No wheezes, crackles, or rhonchi. No acute distress. Heart:  Regular rate and  rhythm; no murmurs, clicks, rubs,  or gallops. Abdomen:  Soft, nontender and nondistended. No masses, hepatosplenomegaly or hernias noted. Normal bowel sounds, without guarding, and without rebound.   Msk:  Symmetrical without gross deformities. Normal posture. Extremities:  Without clubbing or edema. Neurologic:  Alert and  oriented x4;  grossly normal neurologically. Skin:  Intact without significant lesions or rashes. Cervical Nodes:  No significant cervical adenopathy. Psych:  Alert and cooperative. Normal mood and affect.  Impression/Plan: Megan Salazar is here for a colonoscopy to be performed for colon cancer screening purposes. Patient refusing any anesthesia. Discussed potential pain/discomfort during procedure and she understands and wants to proceed.  The risks of the procedure including infection, bleed, or perforation as well as benefits, limitations, alternatives and imponderables have been reviewed with the patient. Questions have been answered. All parties agreeable.

## 2020-08-28 ENCOUNTER — Encounter (HOSPITAL_COMMUNITY): Payer: Self-pay | Admitting: Internal Medicine

## 2020-12-20 DIAGNOSIS — J449 Chronic obstructive pulmonary disease, unspecified: Secondary | ICD-10-CM | POA: Diagnosis not present

## 2020-12-20 DIAGNOSIS — E1165 Type 2 diabetes mellitus with hyperglycemia: Secondary | ICD-10-CM | POA: Diagnosis not present

## 2020-12-20 DIAGNOSIS — I10 Essential (primary) hypertension: Secondary | ICD-10-CM | POA: Diagnosis not present

## 2020-12-20 DIAGNOSIS — Z0001 Encounter for general adult medical examination with abnormal findings: Secondary | ICD-10-CM | POA: Diagnosis not present

## 2021-02-07 ENCOUNTER — Other Ambulatory Visit (HOSPITAL_COMMUNITY): Payer: Self-pay | Admitting: Internal Medicine

## 2021-02-07 DIAGNOSIS — E7849 Other hyperlipidemia: Secondary | ICD-10-CM | POA: Diagnosis not present

## 2021-02-07 DIAGNOSIS — Z1231 Encounter for screening mammogram for malignant neoplasm of breast: Secondary | ICD-10-CM

## 2021-02-07 DIAGNOSIS — E119 Type 2 diabetes mellitus without complications: Secondary | ICD-10-CM | POA: Diagnosis not present

## 2021-02-07 DIAGNOSIS — I10 Essential (primary) hypertension: Secondary | ICD-10-CM | POA: Diagnosis not present

## 2021-03-13 ENCOUNTER — Other Ambulatory Visit: Payer: Self-pay

## 2021-03-13 ENCOUNTER — Ambulatory Visit (HOSPITAL_COMMUNITY)
Admission: RE | Admit: 2021-03-13 | Discharge: 2021-03-13 | Disposition: A | Discharge status: Home / Self Care | Payer: Medicaid Other | Source: Ambulatory Visit | Attending: Internal Medicine | Admitting: Internal Medicine

## 2021-03-13 DIAGNOSIS — Z1231 Encounter for screening mammogram for malignant neoplasm of breast: Secondary | ICD-10-CM | POA: Diagnosis not present

## 2021-05-22 ENCOUNTER — Ambulatory Visit (INDEPENDENT_AMBULATORY_CARE_PROVIDER_SITE_OTHER): Payer: Medicaid Other

## 2021-05-22 ENCOUNTER — Other Ambulatory Visit: Payer: Self-pay

## 2021-05-22 ENCOUNTER — Ambulatory Visit
Admission: EM | Admit: 2021-05-22 | Discharge: 2021-05-22 | Disposition: A | Discharge status: Home / Self Care | Payer: Medicaid Other | Attending: Family Medicine | Admitting: Family Medicine

## 2021-05-22 ENCOUNTER — Encounter: Payer: Self-pay | Admitting: Emergency Medicine

## 2021-05-22 DIAGNOSIS — R058 Other specified cough: Secondary | ICD-10-CM | POA: Diagnosis not present

## 2021-05-22 DIAGNOSIS — Z8701 Personal history of pneumonia (recurrent): Secondary | ICD-10-CM

## 2021-05-22 DIAGNOSIS — R059 Cough, unspecified: Secondary | ICD-10-CM

## 2021-05-22 DIAGNOSIS — J309 Allergic rhinitis, unspecified: Secondary | ICD-10-CM | POA: Diagnosis not present

## 2021-05-22 MED ORDER — DEXAMETHASONE SODIUM PHOSPHATE 10 MG/ML IJ SOLN
10.0000 mg | Freq: Once | INTRAMUSCULAR | Status: AC
  Administered 2021-05-22: 10 mg via INTRAMUSCULAR

## 2021-05-22 MED ORDER — DOXYCYCLINE HYCLATE 100 MG PO CAPS: 100.0000 mg | ORAL_CAPSULE | Freq: Two times a day (BID) | ORAL | 0 refills | Status: DC

## 2021-05-22 MED ORDER — AZELASTINE HCL 0.1 % NA SOLN: 1.0000 | Freq: Two times a day (BID) | NASAL | 2 refills | Status: DC

## 2021-05-22 NOTE — ED Provider Notes (Signed)
[facility]    CSN: [id number] Arrival date & time: 05/22/21  0851      History   Chief Complaint Chief Complaint  Patient presents with   Cough    HPI Megan Salazar is a 61 y.o. female.   Presenting today with several day history of progressively worsening sinus congestion, sinus pressure, productive cough, chest tightness, fatigue.  She denies fever, chills, chest pain, shortness of breath, abdominal pain, nausea vomiting or diarrhea.  States she has a history of seasonal allergies and asthma and thinks that this started with the pollen outside.  Has been taking Sudafed with mild temporary relief but notes that the sputum that she is coughing up is brown and dark yellow and she is very concerned about developing pneumonia.  She states last year around this time she had been sick for several days and ended up in the hospital for 5 days with pneumonia.   Past Medical History:  Diagnosis Date   Asthma    Bone spur    Carpal tunnel syndrome    Complication of anesthesia    Patient didnt wake up for 24 hours after anesthesia   Diabetes ([place])    HTN (hypertension)    IBS (irritable bowel syndrome)    chronic diarrhea   Paresthesias    left side   Pneumonia 03/2020   Sciatica     Patient Active Problem List   Diagnosis Date Noted   History of adenomatous polyp of colon 07/04/2020   Acute hypoxemic respiratory failure due to COVID-19 [facility]) 03/30/2020   Hepatic cirrhosis ([place]) 01/19/2018   Colon cancer screening 01/19/2018   Chronic diarrhea 12/31/2011    Past Surgical History:  Procedure Laterality Date   ABDOMINAL HYSTERECTOMY     partial   CARPAL TUNNEL RELEASE     bilat, [place]   CESAREAN SECTION     COLONOSCOPY  07/18/2010   Fields-SIMPLE ADENOMA (Next colonoscopy  06/2020)   COLONOSCOPY WITH PROPOFOL N/A 08/20/2020   Procedure: COLONOSCOPY WITH PROPOFOL;  Surgeon: Eloise Harman, DO;  Location: [facility];  Service: Endoscopy;   Laterality: N/A;  9:00am    OB History   No obstetric history on file.      Home Medications    Prior to Admission medications   Medication Sig Start Date End Date Taking? Authorizing Provider  azelastine (ASTELIN) 0.1 % nasal spray Place 1 spray into both nostrils 2 (two) times daily. Use in each nostril as directed 05/22/21  Yes Volney American, PA-C  doxycycline (VIBRAMYCIN) 100 MG capsule Take 1 capsule (100 mg total) by mouth 2 (two) times daily. 05/22/21  Yes Volney American, PA-C  albuterol (VENTOLIN HFA) 108 (90 Base) MCG/ACT inhaler Inhale 2 puffs into the lungs every 6 (six) hours as needed for wheezing or shortness of breath. 04/03/20   Ghimire, Henreitta Leber, MD  Artificial Tear Ointment (DRY EYES OP) Place 1 drop into both eyes daily as needed (dry eyes).    [provider]  CALCIUM-MAGNESIUM-VITAMIN D PO Take 1 tablet by mouth daily.    [provider]  Charcoal Activated 260 MG CAPS Take 260 mg by mouth See admin instructions. Every 10 days    [provider]  CINNAMON PO Take 1,000 mg by mouth daily.    [provider]  Coenzyme Q10 (COQ10) 200 MG CAPS Take 200 mg by mouth daily.    [provider]  Cyanocobalamin (VITAMIN B-12 PO) Take 500 mcg by  mouth daily.    [provider]  Flaxseed, Linseed, (FLAXSEED OIL PO) Take 1,000 mg by mouth daily.    [provider]  fluticasone (FLOVENT HFA) 110 MCG/ACT inhaler Inhale 1 puff into the lungs 2 (two) times daily as needed (shortness of breath).    [provider]  lisinopril (ZESTRIL) 10 MG tablet Take 5 mg by mouth in the morning and at bedtime. 09/11/19   [provider]  Menthol-Methyl Salicylate (MUSCLE RUB) 10-15 % CREA Apply 1 application topically as needed for muscle pain.    [provider]  Misc Natural Products (TART CHERRY ADVANCED PO) Take 1 capsule by mouth daily.    [provider]  montelukast (SINGULAIR) 10 MG  tablet Take 10 mg by mouth at bedtime.    [provider]  OVER THE COUNTER MEDICATION Take 1 capsule by mouth every evening. Doterra deep blue    [provider]  Probiotic Product (PROBIOTIC DAILY PO) Take 1 each by mouth daily. Gummy    [provider]  TURMERIC PO Take 1,000 mg by mouth daily.    [provider]    Family History Family History  Problem Relation Age of Onset   Coronary artery disease Father    Diabetes Father    Brain cancer Brother    Diverticulitis Sister    Colon cancer Neg Hx    Colon polyps Neg Hx     Social History Social History   Tobacco Use   Smoking status: Never   Smokeless tobacco: Never  Vaping Use   Vaping Use: Former  Substance Use Topics   Alcohol use: Yes    Comment: occas   Drug use: No     Allergies   Equal [aspartame], Latex, Codeine, Amoxicillin, Erythromycin, Soy allergy, Sulfa drugs cross reactors, and Other   Review of Systems Review of Systems Per HPI  Physical Exam Triage Vital Signs ED Triage Vitals [05/22/21 0936]  Enc Vitals Group     BP (!) 145/86     Pulse Rate 78     Resp 18     Temp 98.8 F (37.1 C)     Temp Source Oral     SpO2 94 %     Weight 210 lb (95.3 kg)     Height 5\' 4"  (1.626 m)     Head Circumference      Peak Flow      Pain Score 0     Pain Loc      Pain Edu?      Excl. in [place]?    No data found.  Updated Vital Signs BP (!) 145/86 (BP Location: Right Arm)    Pulse 78    Temp 98.8 F (37.1 C) (Oral)    Resp 18    Ht 5\' 4"  (1.626 m)    Wt 210 lb (95.3 kg)    SpO2 94%    BMI 36.05 kg/m   Visual Acuity Right Eye Distance:   Left Eye Distance:   Bilateral Distance:    Right Eye Near:   Left Eye Near:    Bilateral Near:     Physical Exam Vitals and nursing note reviewed.  Constitutional:      Appearance: Normal appearance. She is not ill-appearing.  HENT:     Head: Atraumatic.     Right Ear: Tympanic membrane normal.     Left Ear: Tympanic  membrane normal.     Nose: Rhinorrhea present.  Mouth/Throat:     Mouth: Mucous membranes are moist.     Pharynx: Posterior oropharyngeal erythema present. No oropharyngeal exudate.  Eyes:     Extraocular Movements: Extraocular movements intact.     Conjunctiva/sclera: Conjunctivae normal.  Cardiovascular:     Rate and Rhythm: Normal rate and regular rhythm.     Heart sounds: Normal heart sounds.  Pulmonary:     Effort: Pulmonary effort is normal.     Breath sounds: Normal breath sounds. No wheezing or rales.  Musculoskeletal:        General: Normal range of motion.     Cervical back: Normal range of motion and neck supple.  Skin:    General: Skin is warm and dry.  Neurological:     Mental Status: She is alert and oriented to person, place, and time.  Psychiatric:        Mood and Affect: Mood normal.        Thought Content: Thought content normal.        Judgment: Judgment normal.     UC Treatments / Results  Labs (all labs ordered are listed, but only abnormal results are displayed) Labs Reviewed - No data to display  EKG   Radiology DG Chest 2 View  Result Date: 05/22/2021 CLINICAL DATA:  Provided history: Cough. Additional history provided: Cough, sinus congestion EXAM: CHEST - 2 VIEW COMPARISON:  Prior chest radiographs 03/30/2020 and earlier. FINDINGS: Heart size within normal limits. Slightly tortuous thoracic aorta. No appreciable airspace consolidation. No evidence of pleural effusion or pneumothorax. No acute bony abnormality identified. Degenerative changes of the bilateral glenohumeral joints. Degenerative changes of the spine. IMPRESSION: No evidence of active cardiopulmonary disease. Electronically Signed   By: Kellie Simmering D.O.   On: 05/22/2021 10:03    Procedures Procedures (including critical care time)  Medications Ordered in UC Medications  dexamethasone (DECADRON) injection 10 mg (10 mg Intramuscular Given 05/22/21 1023)    Initial Impression /  Assessment and Plan / UC Course  I have reviewed the triage vital signs and the nursing notes.  Pertinent labs & imaging results that were available during my care of the patient were reviewed by me and considered in my medical decision making (see chart for details).     Vital signs overall reassuring, very minimally elevated blood pressure reading today but otherwise within normal limits.  Exam also reassuring and she appears in no acute distress.  Suspect allergic sinusitis, not currently on antihistamine or nasal spray as she states that she does not tolerate either.  States that steroid nasal sprays burned her nasal passages.  Discussed that chest x-ray was negative for acute abnormalities including pneumonia but patient adamant about having access to an antibiotic as she does not wish to become worse as she did last year.  Doxycycline sent in case worsening, will also trial Astelin nasal spray to see if this is better tolerated than the steroid nasal sprays.  Discussed antihistamines, supportive over-the-counter measures and return precautions.  Final Clinical Impressions(s) / UC Diagnoses   Final diagnoses:  Allergic sinusitis  History of pneumonia  Productive cough   Discharge Instructions   None    ED Prescriptions     Medication Sig Dispense Auth. Provider   azelastine (ASTELIN) 0.1 % nasal spray Place 1 spray into both nostrils 2 (two) times daily. Use in each nostril as directed 30 mL Volney American, PA-C   doxycycline (VIBRAMYCIN) 100 MG capsule Take 1 capsule (100 mg total) by  mouth 2 (two) times daily. 14 capsule Volney American, [place]      PDMP not reviewed this encounter.   Volney American, [place] 05/22/21 1052

## 2021-05-22 NOTE — ED Triage Notes (Signed)
Pt reports cough, sinus congestion, productive cough. Denies any known fevers. Pt reports called [facility] nurse line and was told to come here to get an xray. Pt reports history of similar symptoms and reports was admitted for pneumonia. Pt reports "I just need something to knock it out before it gets worse."  ? ?

## 2021-09-05 ENCOUNTER — Ambulatory Visit (INDEPENDENT_AMBULATORY_CARE_PROVIDER_SITE_OTHER): Payer: Medicaid Other

## 2021-09-05 ENCOUNTER — Ambulatory Visit
Admission: RE | Admit: 2021-09-05 | Discharge: 2021-09-05 | Disposition: A | Discharge status: Home / Self Care | Payer: Medicaid Other | Source: Ambulatory Visit | Attending: Nurse Practitioner | Admitting: Nurse Practitioner

## 2021-09-05 VITALS — BP 150/89 | HR 73 | Temp 98.4°F | Resp 18

## 2021-09-05 DIAGNOSIS — M25532 Pain in left wrist: Secondary | ICD-10-CM | POA: Diagnosis not present

## 2021-09-05 DIAGNOSIS — M19032 Primary osteoarthritis, left wrist: Secondary | ICD-10-CM | POA: Diagnosis not present

## 2021-09-05 MED ORDER — NAPROXEN 375 MG PO TABS: 375.0000 mg | ORAL_TABLET | Freq: Two times a day (BID) | ORAL | 0 refills | Status: DC | PRN

## 2021-09-05 NOTE — Discharge Instructions (Addendum)
-   The wrist x-ray does not show any fracture of your wrist bones - I suspect there is some irritation around your ulnar nerve; we have put an ace wrap around your arm.  Please try to wear this as much as possible - You can use naproxen 375 mg twice daily as needed for pain - Please follow up with an Orthopedic provider if the pain gets worse or does not improve with the treatment; contact information is below

## 2021-09-05 NOTE — ED Provider Notes (Signed)
[facility]    CSN: [id number] Arrival date & time: 09/05/21  1110      History   Chief Complaint Chief Complaint  Patient presents with   Wrist Pain    been in pain since Sunday morning, on my left wrist. The pain has got to the point that i want to throw up, the hand goes back and forth being hot and cold and then sometimes i can't feel it. - Entered by patient    HPI Megan Salazar is a 61 y.o. female.   Patient presents with left wrist pain and swelling that started earlier this week.  She denies any recent fall, accident, or injury.  Reports it was painful and swollen one morning when she woke up.  Reports the pain is radiating up to her elbow and sometimes to her shoulder.  She is having numbness/tingling in her fingers; reports chronic numbness in her first and second digit, however the rest of her fingers not usually numb/tingling.  She endorses weakness and swelling.  Denies any redness or bruising, fevers, nausea/vomiting.  She has tried cool compresses which seem to help.  She is very sensitive to medications and has not tried medication for pain.  Movement makes the pain worse.  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  Reports she tries to take herbal supplements as much as she can to control her chronic disease.       Past Medical History:  Diagnosis Date   Asthma    Bone spur    Carpal tunnel syndrome    Complication of anesthesia    Patient didnt wake up for 24 hours after anesthesia   Diabetes ([place])    HTN (hypertension)    IBS (irritable bowel syndrome)    chronic diarrhea   Paresthesias    left side   Pneumonia 03/2020   Sciatica     Patient Active Problem List   Diagnosis Date Noted   History of adenomatous polyp of colon 07/04/2020   Acute hypoxemic respiratory failure due to COVID-19 [facility]) 03/30/2020   Hepatic cirrhosis ([place]) 01/19/2018   Colon cancer screening 01/19/2018    Chronic diarrhea 12/31/2011    Past Surgical History:  Procedure Laterality Date   ABDOMINAL HYSTERECTOMY     partial   CARPAL TUNNEL RELEASE     bilat, [place]   CESAREAN SECTION     COLONOSCOPY  07/18/2010   Fields-SIMPLE ADENOMA (Next colonoscopy  06/2020)   COLONOSCOPY WITH PROPOFOL N/A 08/20/2020   Procedure: COLONOSCOPY WITH PROPOFOL;  Surgeon: Eloise Harman, DO;  [facility];  Service: Endoscopy;  Laterality: N/A;  9:00am    OB History   No obstetric history on file.      Home Medications    Prior to Admission medications   Medication Sig Start Date End Date Taking? Authorizing Provider  naproxen (NAPROSYN) 375 MG tablet Take 1 tablet (375 mg total) by mouth 2 (two) times daily as needed. Take with food to prevent GI upset 09/05/21  Yes Noemi Chapel A, NP  albuterol (VENTOLIN HFA) 108 (90 Base) MCG/ACT inhaler Inhale 2 puffs into the lungs every 6 (six) hours as needed for wheezing or shortness of breath. 04/03/20   Ghimire, Henreitta Leber, MD  Artificial Tear Ointment (DRY EYES OP) Place 1 drop into both eyes daily as needed (dry eyes).    [provider]  azelastine (ASTELIN) 0.1 % nasal spray Place 1 spray  into both nostrils 2 (two) times daily. Use in each nostril as directed 05/22/21   Volney American, PA-C  CALCIUM-MAGNESIUM-VITAMIN D PO Take 1 tablet by mouth daily.    [provider]  Charcoal Activated 260 MG CAPS Take 260 mg by mouth See admin instructions. Every 10 days    [provider]  CINNAMON PO Take 1,000 mg by mouth daily.    [provider]  Coenzyme Q10 (COQ10) 200 MG CAPS Take 200 mg by mouth daily.    [provider]  Cyanocobalamin (VITAMIN B-12 PO) Take 500 mcg by mouth daily.    [provider]  doxycycline (VIBRAMYCIN) 100 MG capsule Take 1 capsule (100 mg total) by mouth 2 (two) times daily. 05/22/21   Volney American, PA-C  Flaxseed, Linseed, (FLAXSEED OIL PO) Take 1,000  mg by mouth daily.    [provider]  fluticasone (FLOVENT HFA) 110 MCG/ACT inhaler Inhale 1 puff into the lungs 2 (two) times daily as needed (shortness of breath).    [provider]  lisinopril (ZESTRIL) 10 MG tablet Take 5 mg by mouth in the morning and at bedtime. 09/11/19   [provider]  Menthol-Methyl Salicylate (MUSCLE RUB) 10-15 % CREA Apply 1 application topically as needed for muscle pain.    [provider]  Misc Natural Products (TART CHERRY ADVANCED PO) Take 1 capsule by mouth daily.    [provider]  montelukast (SINGULAIR) 10 MG tablet Take 10 mg by mouth at bedtime.    [provider]  OVER THE COUNTER MEDICATION Take 1 capsule by mouth every evening. Doterra deep blue    [provider]  Probiotic Product (PROBIOTIC DAILY PO) Take 1 each by mouth daily. Gummy    [provider]  TURMERIC PO Take 1,000 mg by mouth daily.    [provider]    Family History Family History  Problem Relation Age of Onset   Coronary artery disease Father    Diabetes Father    Brain cancer Brother    Diverticulitis Sister    Colon cancer Neg Hx    Colon polyps Neg Hx     Social History Social History   Tobacco Use   Smoking status: Never   Smokeless tobacco: Never  Vaping Use   Vaping Use: Never used  Substance Use Topics   Alcohol use: Yes    Comment: occas   Drug use: No     Allergies   Equal [aspartame], Latex, Codeine, Amoxicillin, Erythromycin, Soy allergy, Sulfa drugs cross reactors, and Other   Review of Systems Review of Systems Per HPI  Physical Exam Triage Vital Signs ED Triage Vitals  Enc Vitals Group     BP 09/05/21 1143 (!) 150/89     Pulse Rate 09/05/21 1143 73     Resp 09/05/21 1143 18     Temp 09/05/21 1143 98.4 F (36.9 C)     Temp Source 09/05/21 1143 Oral     SpO2 09/05/21 1143 97 %     Weight --      Height --      Head Circumference --      Peak Flow --       Pain Score 09/05/21 1140 8     Pain Loc --      Pain Edu? --      Excl. in [place]? --    No data found.  Updated Vital Signs BP (!) 150/89 (BP Location: Right  Arm)   Pulse 73   Temp 98.4 F (36.9 C) (Oral)   Resp 18   SpO2 97%   Visual Acuity Right Eye Distance:   Left Eye Distance:   Bilateral Distance:    Right Eye Near:   Left Eye Near:    Bilateral Near:     Physical Exam Vitals and nursing note reviewed.  Constitutional:      General: She is not in acute distress.    Appearance: Normal appearance. She is not toxic-appearing.  HENT:     Head: Normocephalic and atraumatic.  Pulmonary:     Effort: Pulmonary effort is normal. No respiratory distress.  Musculoskeletal:        General: Swelling and tenderness present. No signs of injury.     Right wrist: Normal. Normal pulse.     Left wrist: Swelling, tenderness and bony tenderness present. No effusion or lacerations. Normal range of motion. Normal pulse.     Comments: Inspection: Diffuse swelling to left wrist, no obvious deformity, redness, bruising.  No swelling to right wrist, obvious deformity, redness, or bruising.  Palpation: Left wrist tender to palpation diffusely; tenderness is worse along ulnar nerve.  No right wrist tenderness.  No obvious deformities palpated bilaterally.   ROM: Full ROM, although it is painful Strength: 4/5 left wrist Neurovascular: neurovascularly intact in left and right upper extremity   Skin:    General: Skin is warm and dry.     Capillary Refill: Capillary refill takes less than 2 seconds.     Coloration: Skin is not jaundiced or pale.     Findings: No erythema.  Neurological:     Mental Status: She is alert and oriented to person, place, and time.  Psychiatric:        Behavior: Behavior is cooperative.      UC Treatments / Results  Labs (all labs ordered are listed, but only abnormal results are displayed) Labs Reviewed - No data to display  EKG   Radiology DG Wrist  Complete Left  Result Date: 09/05/2021 CLINICAL DATA:  Pain left wrist EXAM: LEFT WRIST - COMPLETE 3+ VIEW COMPARISON:  None Available. FINDINGS: No recent fracture or dislocation is seen. There are small faint calcifications in the soft tissues adjacent to the distal radius possibly chondrocalcinosis due to degenerative arthritis. There are small subcortical cysts in the multiple carpal bones. Marked degenerative changes are noted in first carpometacarpal joint with joint space narrowing, subcortical cysts and bony spurs. Small bony spurs seen in the first metacarpophalangeal joint. IMPRESSION: No recent fracture or dislocation is seen in the left wrist. Degenerative changes are noted with chondrocalcinosis and subcortical cysts in multiple carpal bones in the wrist. Severe degenerative changes are noted in first carpometacarpal joint. Electronically Signed   By: Elmer Picker M.D.   On: 09/05/2021 12:24    Procedures Procedures (including critical care time)  Medications Ordered in UC Medications - No data to display  Initial Impression / Assessment and Plan / UC Course  I have reviewed the triage vital signs and the nursing notes.  Pertinent labs & imaging results that were available during my care of the patient were reviewed by me and considered in my medical decision making (see chart for details).    Wrist x-ray today does not show any fractures, dislocation.  There are degenerative changes, subcortical cysts and multiple carpal bones, and severe degenerative changes in the first metacarpal joint.  I do not think that this explains the patient's symptoms  and I suspect ulnar nerve irritation/inflammation.  Patient placed in an Ace wrap, start alternating NSAIDs with Tylenol, use ice as needed for pain/swelling.  Follow-up with orthopedic provider-contact information given. Final Clinical Impressions(s) / UC Diagnoses   Final diagnoses:  Left wrist pain     Discharge Instructions       - The wrist x-ray does not show any fracture of your wrist bones - I suspect there is some irritation around your ulnar nerve; we have put an ace wrap around your arm.  Please try to wear this as much as possible - You can use naproxen 375 mg twice daily as needed for pain - Please follow up with an Orthopedic provider if the pain gets worse or does not improve with the treatment; contact information is below     ED Prescriptions     Medication Sig Dispense Auth. Provider   naproxen (NAPROSYN) 375 MG tablet Take 1 tablet (375 mg total) by mouth 2 (two) times daily as needed. Take with food to prevent GI upset 20 tablet Eulogio Bear, NP      PDMP not reviewed this encounter.   Eulogio Bear, NP 09/05/21 1242

## 2021-09-05 NOTE — ED Triage Notes (Signed)
Pt states her left wrist started hurting o Sunday morning  Pt states that her fingers are sore and numb in her left hand  Pt states that the pain is now shooting up her left arm to her shoulder  Pt denies any injury

## 2021-11-04 ENCOUNTER — Ambulatory Visit (INDEPENDENT_AMBULATORY_CARE_PROVIDER_SITE_OTHER): Payer: Medicaid Other

## 2021-11-04 ENCOUNTER — Ambulatory Visit: Payer: Medicaid Other | Admitting: Orthopedic Surgery

## 2021-11-04 ENCOUNTER — Encounter: Payer: Self-pay | Admitting: Orthopedic Surgery

## 2021-11-04 VITALS — BP 157/78 | HR 67 | Ht 64.0 in | Wt 225.2 lb

## 2021-11-04 DIAGNOSIS — M25512 Pain in left shoulder: Secondary | ICD-10-CM

## 2021-11-04 DIAGNOSIS — M19012 Primary osteoarthritis, left shoulder: Secondary | ICD-10-CM | POA: Diagnosis not present

## 2021-11-04 NOTE — Patient Instructions (Addendum)
Instructions Following Joint Injections  In clinic today, you received an injection in one of your joints (sometimes more than one).  Occasionally, you can have some pain at the injection site, this is normal.  You can place ice at the injection site, or take over-the-counter medications such as Tylenol (acetaminophen) or Advil (ibuprofen).  Please follow all directions listed on the bottle.  If your joint (knee or shoulder) becomes swollen, red or very painful, please contact the clinic for additional assistance.   Two medications were injected, including lidocaine and a steroid (often referred to as cortisone).  Lidocaine is effective almost immediately but wears off quickly.  However, the steroid can take a few days to improve your symptoms.  In some cases, it can make your pain worse for a couple of days.  Do not be concerned if this happens as it is common.  You can apply ice or take some over-the-counter medications as needed.    Consider medicines for the left shoulder and left wrist.   We have discussed taking over the counter medications for your pain.  Below are some common medicines to consider.  Also listed are the recommended doses and how to take these medications as a prescription strength medicine.  If you experience any issues or side effects, please discontinue the medicine and contact the office for some assistance.   Tylenol or acetaminophen - can be used to help treat minor pains and fevers or chills.  Common doses include 350 mg, 500 mg and 650 mg.  Following surgery, I recommend taking 1000 mg of this medication three times a day.  Some narcotic pain medications contain acetaminophen, so you cannot take additional acetaminophen. This medicine can be bad for your liver.  DO NOT TAKE MORE THAN 4000 mg per day.  Advil, Motrin or Ibuprofen - anti-inflammatory medicines.  Can be used to treat chronic pain and help with swelling.  Common over the counter dose includes 200 mg.  Bottle  states you can take 1-2 pills.  Common prescription doses include 600 mg or 800 mg pills.  This medication can be hard on your stomach and your kidneys.  If you have a history of stomach ulcers or kidney issues, you should discuss this with your PCP.  This medicine can be taken 3 times per day.  You should take this medication with food in your stomach.   Aleve or Naproxen - anti inflammatory medicines.  Can be used to treat chronic pain and help with swelling. Common over the counter dose is 220 mg.  The bottle states you can take 1 pill, twice a day.   Typical prescription dose is 500 mg two times a day.  This medication can be hard on your stomach and your kidneys.  If you have a history of stomach ulcers or kidney issues, you should discuss this with your PCP.  This medicine can be taken 2 times per day.  You should take this medication with food in your stomach.    Acetaminopen and ibuprofen/naproxen can be taking at the same time, but please do not take ibuprofen and naproxen at the same time.  These medications work the same way and can cause further injury to your stomach and/or kidneys.    Patients taking a blood thinner are often encouraged not to take NSAIDs or medications like ibuprofen or naproxen because they increase the possibility of internal bleeding.  If you have any questions, you should contact the doctor who recommended these medicines.

## 2021-11-05 ENCOUNTER — Encounter: Payer: Self-pay | Admitting: Orthopedic Surgery

## 2021-11-05 NOTE — Progress Notes (Signed)
New Patient Visit  Assessment: Megan Salazar is a 61 y.o. female with the following: 1. Glenohumeral arthritis, left  Plan: Megan Salazar was referred here for left wrist pain.  However, she had severely diminished range of motion in her left shoulder, with associated pain.  This has been affecting her for months.  Further evaluation demonstrated advanced left glenohumeral arthritis.  She has not had treatment on the shoulder previously.  I recommended a subacromial steroid injection, which was completed today.  Depending on the efficacy, we can consider an image guided glenohumeral joint injection.  We also briefly discussed shoulder arthroplasty, but she was understandably reluctant.  We will plan to proceed with nonoperative measures, and I will see her as needed.  Procedure note injection Left shoulder    Verbal consent was obtained to inject the left shoulder, subacromial space Timeout was completed to confirm the site of injection.  The skin was prepped with alcohol and ethyl chloride was sprayed at the injection site.  A 21-gauge needle was used to inject 40 mg of Depo-Medrol and 1% lidocaine (3 cc) into the subacromial space of the left shoulder using a posterolateral approach.  There were no complications. A sterile bandage was applied.   Follow-up: Return if symptoms worsen or fail to improve.  Subjective:  Chief Complaint  Patient presents with   Wrist Pain    L/ it has been hurting over a year, just gotten worse about 3 months now, it is swollen and the pain goes up into arm. Went to [facility] back in May or June, they xrayed it.    History of Present Illness: Megan Salazar is a 61 y.o. female who has been referred by  Megan Fire, MD for evaluation of left wrist pain.  She states that it has bothered her for about a year.  It is gotten worse over the past 3 months.  In addition, she is complaining of limited motion and use of her left shoulder.  She also  notes popping and grinding.  She has radiating pains up and down her left arm.  She takes medications occasionally.  She states that she is very active, swimming on a regular basis, but due to the pain in her shoulder and her wrist, she has been unable to swim as she would like.  No numbness or tingling.  No prior injuries to her left shoulder or her wrist.  She was seen at a local urgent care center, and told that she had osteophytes in her left wrist.  She notes swelling.  No physical therapy.   Review of Systems: No fevers or chills No numbness or tingling No chest pain No shortness of breath No bowel or bladder dysfunction No GI distress No headaches   Medical History:  Past Medical History:  Diagnosis Date   Asthma    Bone spur    Carpal tunnel syndrome    Complication of anesthesia    Patient didnt wake up for 24 hours after anesthesia   Diabetes ([place])    HTN (hypertension)    IBS (irritable bowel syndrome)    chronic diarrhea   Paresthesias    left side   Pneumonia 03/2020   Sciatica     Past Surgical History:  Procedure Laterality Date   ABDOMINAL HYSTERECTOMY     partial   CARPAL TUNNEL RELEASE     bilat, [place]   CESAREAN SECTION     COLONOSCOPY  07/18/2010   Fields-SIMPLE ADENOMA (Next colonoscopy  06/2020)   COLONOSCOPY WITH PROPOFOL N/A 08/20/2020   Procedure: COLONOSCOPY WITH PROPOFOL;  Surgeon: Eloise Harman, DO;  [facility];  Service: Endoscopy;  Laterality: N/A;  9:00am    Family History  Problem Relation Age of Onset   Coronary artery disease Father    Diabetes Father    Brain cancer Brother    Diverticulitis Sister    Colon cancer Neg Hx    Colon polyps Neg Hx    Social History   Tobacco Use   Smoking status: Never   Smokeless tobacco: Never  Vaping Use   Vaping Use: Never used  Substance Use Topics   Alcohol use: Yes    Comment: occas   Drug use: No    Allergies  Allergen Reactions   Equal [Aspartame]     PT  SAID SHE IS HIGHLY ALLERGIC TO ARTIFICIAL SWEETNERS. GETS DIARRHEA EXTREMELY BAD AND HIVES.    Latex Hives   Codeine Nausea And Vomiting    Liquid codeine   Amoxicillin    Erythromycin    Soy Allergy     All soy products   Sulfa Drugs Cross Reactors Nausea And Vomiting   Other Rash    Rubber/glue    Current Meds  Medication Sig   albuterol (VENTOLIN HFA) 108 (90 Base) MCG/ACT inhaler Inhale 2 puffs into the lungs every 6 (six) hours as needed for wheezing or shortness of breath.   Artificial Tear Ointment (DRY EYES OP) Place 1 drop into both eyes daily as needed (dry eyes).   azelastine (ASTELIN) 0.1 % nasal spray Place 1 spray into both nostrils 2 (two) times daily. Use in each nostril as directed   CALCIUM-MAGNESIUM-VITAMIN D PO Take 1 tablet by mouth daily.   Charcoal Activated 260 MG CAPS Take 260 mg by mouth See admin instructions. Every 10 days   CINNAMON PO Take 1,000 mg by mouth daily.   Coenzyme Q10 (COQ10) 200 MG CAPS Take 200 mg by mouth daily.   Cyanocobalamin (VITAMIN B-12 PO) Take 500 mcg by mouth daily.   doxycycline (VIBRAMYCIN) 100 MG capsule Take 1 capsule (100 mg total) by mouth 2 (two) times daily.   Flaxseed, Linseed, (FLAXSEED OIL PO) Take 1,000 mg by mouth daily.   fluticasone (FLOVENT HFA) 110 MCG/ACT inhaler Inhale 1 puff into the lungs 2 (two) times daily as needed (shortness of breath).   lisinopril (ZESTRIL) 10 MG tablet Take 5 mg by mouth in the morning and at bedtime.   Menthol-Methyl Salicylate (MUSCLE RUB) 10-15 % CREA Apply 1 application topically as needed for muscle pain.   Misc Natural Products (TART CHERRY ADVANCED PO) Take 1 capsule by mouth daily.   montelukast (SINGULAIR) 10 MG tablet Take 10 mg by mouth at bedtime.   naproxen (NAPROSYN) 375 MG tablet Take 1 tablet (375 mg total) by mouth 2 (two) times daily as needed. Take with food to prevent GI upset   OVER THE COUNTER MEDICATION Take 1 capsule by mouth every evening. Doterra deep blue    Probiotic Product (PROBIOTIC DAILY PO) Take 1 each by mouth daily. Gummy   TURMERIC PO Take 1,000 mg by mouth daily.    Objective: BP (!) 157/78   Pulse 67   Ht '5\' 4"'$  (1.626 m)   Wt 225 lb 4 oz (102.2 kg)   BMI 38.66 kg/m   Physical Exam:  General: Alert and oriented. and No acute distress. Gait: Normal gait.  Evaluation left hand demonstrates some mild swelling on  the dorsum of the hand.  No point tenderness.  Some pain with range of motion.  She has severely limited range of motion in her left shoulder.  Difficulty getting her arm to 90 degrees of forward flexion.  Difficulty getting her arm to her lower back.  There is crepitus with range of motion testing.  IMAGING: I personally ordered and reviewed the following images  X-rays of the left shoulder were obtained in clinic today.  No acute injuries are noted.  She has severe glenohumeral arthritis, with bone-on-bone articulation.  The humeral head appears flattened, with large inferior osteophytes.  No evidence of proximal humeral migration.  There does appear to be some small cysts in the inferior aspect of the glenoid.  Impression: Severe left glenohumeral arthritis.  New Medications:  No orders of the defined types were placed in this encounter.     Mordecai Rasmussen, MD  11/05/2021 9:11 AM

## 2021-11-17 ENCOUNTER — Ambulatory Visit: Payer: Medicaid Other | Admitting: Orthopedic Surgery

## 2021-11-17 ENCOUNTER — Encounter: Payer: Self-pay | Admitting: Orthopedic Surgery

## 2021-11-17 DIAGNOSIS — M19012 Primary osteoarthritis, left shoulder: Secondary | ICD-10-CM

## 2021-11-17 NOTE — Patient Instructions (Signed)
Call if you want to schedule a glenohumeral joint injection

## 2021-11-17 NOTE — Progress Notes (Signed)
Return Patient Visit  Assessment: Megan Salazar is a 61 y.o. female with the following: 1. 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  Prior subacromial injection has improved her pain, but not completely alleviated her pain.  She is interested in a glenohumeral joint injection, but I think it is too soon to consider this currently.  I asked her to wait a couple of weeks, and if she still has issues, and would like to proceed with a image guided injection, she will contact the clinic.  Also important for future visits, is that she is sensitive to most metals, and has not done well with anesthesia in the past.  As result, she would like to consider surgery, only as a last resort.  Should we proceed down that path, we will discuss with colleagues to ensure that we have a good before surgery.   Follow-up: Return if symptoms worsen or fail to improve.  Subjective:  Chief Complaint  Patient presents with   Shoulder Pain    LT shoulder/ still having issues raising the arm and weather is affecting it today  Not in as much pain 1-2/10 pain today    History of Present Illness: Megan Salazar is a 61 y.o. female who returns to clinic today for repeat evaluation of her left shoulder pain.  I saw her in clinic a couple of weeks ago.  She has severe arthritis in the left shoulder.  We completed an injection.  She had no issues following the injection.  She states that the pain has improved.  However, it has not completely gotten better.  She continues to have grinding sensation and limited motion.  She is interested in proceeding with a glenohumeral joint injection.  In addition, she wanted [place] to be aware that she has had issues with anesthesia, stating she is very sensitive.  Also, she states that she has allergic to most metals.  Review of Systems: No fevers or chills No numbness or tingling No chest pain No shortness of  breath No bowel or bladder dysfunction No GI distress No headaches   Objective: There were no vitals taken for this visit.  Physical Exam:  General: Alert and oriented. and No acute distress. Gait: Normal gait.  Evaluation left hand demonstrates some mild swelling on the dorsum of the hand.  No point tenderness.  Some pain with range of motion.  She has severely limited range of motion in her left shoulder.  Difficulty getting her arm to 90 degrees of forward flexion.  Difficulty getting her arm to her lower back.  There is crepitus with range of motion testing.  IMAGING: I personally ordered and reviewed the following images  No new imaging obtained today  New Medications:  No orders of the defined types were placed in this encounter.     Mordecai Rasmussen, MD  11/17/2021 2:08 PM

## 2021-12-09 ENCOUNTER — Ambulatory Visit: Payer: Medicaid Other | Admitting: Orthopedic Surgery

## 2021-12-09 ENCOUNTER — Encounter: Payer: Self-pay | Admitting: Orthopedic Surgery

## 2021-12-09 VITALS — BP 191/90 | HR 69 | Ht 64.5 in | Wt 227.0 lb

## 2021-12-09 DIAGNOSIS — M19012 Primary osteoarthritis, left shoulder: Secondary | ICD-10-CM

## 2021-12-09 MED ORDER — CYCLOBENZAPRINE HCL 5 MG PO TABS: 5.0000 mg | ORAL_TABLET | Freq: Two times a day (BID) | ORAL | 0 refills | Status: DC | PRN

## 2021-12-09 NOTE — Patient Instructions (Addendum)
Central Scheduling [phone]    Referral for an XR guided injection in the left shoulder.    Please contact the clinic if you have any issues.

## 2021-12-09 NOTE — Progress Notes (Signed)
Return Patient Visit  Assessment: Megan Salazar is a 61 y.o. female with the following: 1. 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  She continues to have pain, and limited function.  We discussed proceeding with an image guided steroid injection, and this was ordered today.  I have also provided her with a prescription for Flexeril, to help with some of the spasm, and allow her to get more rest.  She will follow-up as needed.  Follow-up: Return if symptoms worsen or fail to improve.  Subjective:  Chief Complaint  Patient presents with   Shoulder Pain    Pain at night left shoulder also has pain during the day states 24/7 is painful, drags arm in water when swimming     History of Present Illness: Megan Salazar is a 61 y.o. female who returns to clinic today for repeat evaluation of her left shoulder pain.  I saw her in clinic a few weeks ago.  Since then, the left shoulder pain has worsened.  Affecting her sleep.  She is not able to do her usual exercises.  Prior subacromial steroid injection has worn off.  She is taking over-the-counter pain medications and supplements, with limited movement.  Review of Systems: No fevers or chills No numbness or tingling No chest pain No shortness of breath No bowel or bladder dysfunction No GI distress No headaches   Objective: BP (!) 191/90   Pulse 69   Ht 5' 4.5" (1.638 m)   Wt 227 lb (103 kg)   BMI 38.36 kg/m   Physical Exam:  General: Alert and oriented. and No acute distress. Gait: Normal gait.  Evaluation left hand demonstrates some mild swelling on the dorsum of the hand.  No point tenderness.  Some pain with range of motion.  She has severely limited range of motion in her left shoulder.  Difficulty getting her arm to 90 degrees of forward flexion.  Difficulty getting her arm to her lower back.  There is crepitus with range of motion  testing.  IMAGING: I personally ordered and reviewed the following images  No new imaging obtained today  New Medications:  Meds ordered this encounter  Medications   cyclobenzaprine (FLEXERIL) 5 MG tablet    Sig: Take 1 tablet (5 mg total) by mouth 2 (two) times daily as needed for muscle spasms.    Dispense:  20 tablet    Refill:  0      Mordecai Rasmussen, MD  12/09/2021 9:14 AM

## 2021-12-18 ENCOUNTER — Encounter (HOSPITAL_COMMUNITY): Payer: Self-pay

## 2021-12-18 ENCOUNTER — Ambulatory Visit (HOSPITAL_COMMUNITY)
Admission: RE | Admit: 2021-12-18 | Discharge: 2021-12-18 | Disposition: A | Discharge status: Home / Self Care | Payer: Medicaid Other | Source: Ambulatory Visit | Attending: Orthopedic Surgery | Admitting: Orthopedic Surgery

## 2021-12-18 DIAGNOSIS — M19012 Primary osteoarthritis, left shoulder: Secondary | ICD-10-CM | POA: Insufficient documentation

## 2021-12-18 MED ORDER — IOHEXOL 180 MG/ML  SOLN
INTRAMUSCULAR | Status: AC
  Administered 2021-12-18: 15 mL
  Filled 2021-12-18: qty 10

## 2021-12-18 MED ORDER — METHYLPREDNISOLONE ACETATE 40 MG/ML IJ SUSP
INTRAMUSCULAR | Status: AC
  Administered 2021-12-18: 40 mg
  Filled 2021-12-18: qty 1

## 2021-12-18 MED ORDER — SODIUM CHLORIDE (PF) 0.9 % IJ SOLN
INTRAMUSCULAR | Status: AC
  Filled 2021-12-18: qty 10

## 2021-12-18 MED ORDER — POVIDONE-IODINE 10 % EX SOLN
CUTANEOUS | Status: AC
  Administered 2021-12-18: 1
  Filled 2021-12-18: qty 14.8

## 2021-12-18 MED ORDER — LIDOCAINE HCL (PF) 1 % IJ SOLN
INTRAMUSCULAR | Status: AC
  Administered 2021-12-18: 5 mL
  Filled 2021-12-18: qty 5

## 2021-12-18 MED ORDER — IOHEXOL 180 MG/ML  SOLN
INTRAMUSCULAR | Status: AC
  Filled 2021-12-18: qty 10

## 2021-12-18 MED ORDER — BUPIVACAINE HCL (PF) 0.5 % IJ SOLN
INTRAMUSCULAR | Status: AC
  Administered 2021-12-18: 5 mL
  Filled 2021-12-18: qty 30

## 2021-12-18 NOTE — Procedures (Signed)
Preprocedure Dx: Arthritis of left shoulder  Postprocedure Dx: Arthritis of left shoulder Procedure  Fluoroscopically guided LEFT joint injection , threapeutic Radiologist:  Thornton Papas Anesthesia:  4 ml of 1% lidocaine Injectate:  40 mg DepoMedrol, 3 ml Sensorcaine 0.5% Fluoro time:  0 minutes 48 seconds EBL:   None Complications: None

## 2021-12-23 DIAGNOSIS — I10 Essential (primary) hypertension: Secondary | ICD-10-CM | POA: Diagnosis not present

## 2021-12-23 DIAGNOSIS — E7849 Other hyperlipidemia: Secondary | ICD-10-CM | POA: Diagnosis not present

## 2021-12-23 DIAGNOSIS — Z0001 Encounter for general adult medical examination with abnormal findings: Secondary | ICD-10-CM | POA: Diagnosis not present

## 2021-12-23 DIAGNOSIS — E118 Type 2 diabetes mellitus with unspecified complications: Secondary | ICD-10-CM | POA: Diagnosis not present

## 2021-12-23 DIAGNOSIS — J45909 Unspecified asthma, uncomplicated: Secondary | ICD-10-CM | POA: Diagnosis not present

## 2022-01-07 ENCOUNTER — Other Ambulatory Visit: Payer: Self-pay

## 2022-01-07 ENCOUNTER — Ambulatory Visit
Admission: EM | Admit: 2022-01-07 | Discharge: 2022-01-07 | Disposition: A | Discharge status: Home / Self Care | Payer: Medicaid Other | Attending: Family Medicine | Admitting: Family Medicine

## 2022-01-07 ENCOUNTER — Ambulatory Visit: Payer: Medicaid Other

## 2022-01-07 ENCOUNTER — Encounter: Payer: Self-pay | Admitting: Emergency Medicine

## 2022-01-07 DIAGNOSIS — J4541 Moderate persistent asthma with (acute) exacerbation: Secondary | ICD-10-CM | POA: Diagnosis not present

## 2022-01-07 DIAGNOSIS — J3089 Other allergic rhinitis: Secondary | ICD-10-CM | POA: Diagnosis not present

## 2022-01-07 MED ORDER — ALBUTEROL SULFATE HFA 108 (90 BASE) MCG/ACT IN AERS: 2.0000 | INHALATION_SPRAY | Freq: Four times a day (QID) | RESPIRATORY_TRACT | 0 refills | Status: DC | PRN

## 2022-01-07 MED ORDER — METHYLPREDNISOLONE SODIUM SUCC 125 MG IJ SOLR
60.0000 mg | Freq: Once | INTRAMUSCULAR | Status: AC
  Administered 2022-01-07: 60 mg via INTRAMUSCULAR

## 2022-01-07 MED ORDER — ALBUTEROL SULFATE (2.5 MG/3ML) 0.083% IN NEBU
2.5000 mg | INHALATION_SOLUTION | Freq: Once | RESPIRATORY_TRACT | Status: AC
  Administered 2022-01-07: 2.5 mg via RESPIRATORY_TRACT

## 2022-01-07 MED ORDER — FLUTICASONE PROPIONATE HFA 110 MCG/ACT IN AERO: 1.0000 | INHALATION_SPRAY | Freq: Two times a day (BID) | RESPIRATORY_TRACT | 2 refills | Status: DC | PRN

## 2022-01-07 MED ORDER — ALBUTEROL SULFATE (2.5 MG/3ML) 0.083% IN NEBU: 2.5000 mg | INHALATION_SOLUTION | Freq: Four times a day (QID) | RESPIRATORY_TRACT | 0 refills | Status: DC | PRN

## 2022-01-07 MED ORDER — AZELASTINE HCL 0.1 % NA SOLN: 1.0000 | Freq: Two times a day (BID) | NASAL | 2 refills | Status: DC

## 2022-01-07 NOTE — ED Provider Notes (Signed)
[facility]    CSN: [id number] Arrival date & time: 01/07/22  1241      History   Chief Complaint Chief Complaint  Patient presents with   Cough    HPI Megan Salazar is a 61 y.o. female.   Patient presenting today with about a week of progressively worsening chest tightness, wheezing, occasional shortness of breath, productive cough, postnasal drip, sinus pressure.  States symptoms started after her apartment complex started cutting trees outside.  Known history of seasonal allergies and asthma, has been out of her Flovent for quite some time and tries not to use her albuterol as it "gives her thrush".  She borrowed a friend's nebulizer machine but does not have solution for it.  States she has a history of pneumonia and respiratory failure, most recently during the [place] initial outbreak.  Uses Singulair for seasonal allergies, intolerant to everything else per patient.  Not trying anything over-the-counter as she is intolerant to all over-the-counter medications for cold and congestion and uses essential oils such as frankincense instead.  No known sick contacts recently.  Denies fever, chills, chest pain, abdominal pain, nausea vomiting or diarrhea.    Past Medical History:  Diagnosis Date   Asthma    Bone spur    Carpal tunnel syndrome    Complication of anesthesia    Patient didnt wake up for 24 hours after anesthesia   Diabetes ([place])    HTN (hypertension)    IBS (irritable bowel syndrome)    chronic diarrhea   Paresthesias    left side   Pneumonia 03/2020   Sciatica     Patient Active Problem List   Diagnosis Date Noted   History of adenomatous polyp of colon 07/04/2020   Acute hypoxemic respiratory failure due to COVID-19 [facility]) 03/30/2020   Hepatic cirrhosis ([place]) 01/19/2018   Colon cancer screening 01/19/2018   Chronic diarrhea 12/31/2011    Past Surgical History:  Procedure Laterality Date   ABDOMINAL HYSTERECTOMY     partial   CARPAL  TUNNEL RELEASE     bilat, [place]   CESAREAN SECTION     COLONOSCOPY  07/18/2010   Fields-SIMPLE ADENOMA (Next colonoscopy  06/2020)   COLONOSCOPY WITH PROPOFOL N/A 08/20/2020   Procedure: COLONOSCOPY WITH PROPOFOL;  Surgeon: Eloise Harman, DO;  Location: [facility];  Service: Endoscopy;  Laterality: N/A;  9:00am    OB History   No obstetric history on file.      Home Medications    Prior to Admission medications   Medication Sig Start Date End Date Taking? Authorizing Provider  albuterol (PROVENTIL) (2.5 MG/3ML) 0.083% nebulizer solution Take 3 mLs (2.5 mg total) by nebulization every 6 (six) hours as needed for wheezing or shortness of breath. 01/07/22  Yes Volney American, PA-C  albuterol (VENTOLIN HFA) 108 (90 Base) MCG/ACT inhaler Inhale 2 puffs into the lungs every 6 (six) hours as needed for wheezing or shortness of breath. 01/07/22   Volney American, PA-C  Artificial Tear Ointment (DRY EYES OP) Place 1 drop into both eyes daily as needed (dry eyes).    [provider]  azelastine (ASTELIN) 0.1 % nasal spray Place 1 spray into both nostrils 2 (two) times daily. Use in each nostril as directed 01/07/22   Volney American, PA-C  CALCIUM-MAGNESIUM-VITAMIN D PO Take 1 tablet by mouth daily.    [provider]  Charcoal Activated 260 MG CAPS Take 260 mg by mouth See admin instructions. Every  10 days    [provider]  CINNAMON PO Take 1,000 mg by mouth daily.    [provider]  Coenzyme Q10 (COQ10) 200 MG CAPS Take 200 mg by mouth daily.    [provider]  Cyanocobalamin (VITAMIN B-12 PO) Take 500 mcg by mouth daily.    [provider]  cyclobenzaprine (FLEXERIL) 5 MG tablet Take 1 tablet (5 mg total) by mouth 2 (two) times daily as needed for muscle spasms. 12/09/21   Mordecai Rasmussen, MD  Flaxseed, Linseed, (FLAXSEED OIL PO) Take 1,000 mg by mouth daily.    [provider]  fluticasone (FLOVENT  HFA) 110 MCG/ACT inhaler Inhale 1 puff into the lungs 2 (two) times daily as needed (shortness of breath). 01/07/22   Volney American, PA-C  lisinopril (ZESTRIL) 10 MG tablet Take 5 mg by mouth in the morning and at bedtime. 09/11/19   [provider]  Menthol-Methyl Salicylate (MUSCLE RUB) 10-15 % CREA Apply 1 application topically as needed for muscle pain.    [provider]  Misc Natural Products (TART CHERRY ADVANCED PO) Take 1 capsule by mouth daily.    [provider]  montelukast (SINGULAIR) 10 MG tablet Take 10 mg by mouth at bedtime.    [provider]  naproxen (NAPROSYN) 375 MG tablet Take 1 tablet (375 mg total) by mouth 2 (two) times daily as needed. Take with food to prevent GI upset Patient not taking: Reported on 12/09/2021 09/05/21   Eulogio Bear, NP  OVER THE COUNTER MEDICATION Take 1 capsule by mouth every evening. Doterra deep blue    [provider]  Probiotic Product (PROBIOTIC DAILY PO) Take 1 each by mouth daily. Gummy    [provider]  TURMERIC PO Take 1,000 mg by mouth daily.    [provider]    Family History Family History  Problem Relation Age of Onset   Coronary artery disease Father    Diabetes Father    Brain cancer Brother    Diverticulitis Sister    Colon cancer Neg Hx    Colon polyps Neg Hx     Social History Social History   Tobacco Use   Smoking status: Never   Smokeless tobacco: Never  Vaping Use   Vaping Use: Never used  Substance Use Topics   Alcohol use: Yes    Comment: occas   Drug use: No     Allergies   Equal [aspartame], Latex, Codeine, Amoxicillin, Erythromycin, Soy allergy, Sulfa antibiotics, Sulfa drugs cross reactors, and Other   Review of Systems Review of Systems Per HPI  Physical Exam Triage Vital Signs ED Triage Vitals [01/07/22 1338]  Enc Vitals Group     BP (!) 158/88     Pulse Rate 90     Resp 20     Temp 98.4 F (36.9 C)      Temp Source Oral     SpO2 94 %     Weight      Height      Head Circumference      Peak Flow      Pain Score 3     Pain Loc      Pain Edu?      Excl. in [place]?    No data found.  Updated Vital Signs BP (!) 158/88 (BP Location: Right Arm)   Pulse 90   Temp 98.4 F (36.9 C) (Oral)   Resp 20   SpO2 99%  Visual Acuity Right Eye Distance:   Left Eye Distance:   Bilateral Distance:    Right Eye Near:   Left Eye Near:    Bilateral Near:     Physical Exam Vitals and nursing note reviewed.  Constitutional:      Appearance: Normal appearance. She is not ill-appearing.  HENT:     Head: Atraumatic.     Right Ear: Tympanic membrane and external ear normal.     Left Ear: Tympanic membrane and external ear normal.     Nose: Rhinorrhea present.     Mouth/Throat:     Mouth: Mucous membranes are moist.     Pharynx: Posterior oropharyngeal erythema present.  Eyes:     Extraocular Movements: Extraocular movements intact.     Conjunctiva/sclera: Conjunctivae normal.  Cardiovascular:     Rate and Rhythm: Normal rate and regular rhythm.     Heart sounds: Normal heart sounds.  Pulmonary:     Effort: Pulmonary effort is normal.     Breath sounds: Normal breath sounds. No wheezing or rales.  Musculoskeletal:        General: Normal range of motion.     Cervical back: Normal range of motion and neck supple.  Skin:    General: Skin is warm and dry.  Neurological:     Mental Status: She is alert and oriented to person, place, and time.  Psychiatric:        Mood and Affect: Mood normal.        Thought Content: Thought content normal.        Judgment: Judgment normal.      UC Treatments / Results  Labs (all labs ordered are listed, but only abnormal results are displayed) Labs Reviewed - No data to display  EKG   Radiology No results found.  Procedures Procedures (including critical care time)  Medications Ordered in UC Medications  methylPREDNISolone sodium succinate  (SOLU-MEDROL) 125 mg/2 mL injection 60 mg (60 mg Intramuscular Given 01/07/22 1407)  albuterol (PROVENTIL) (2.5 MG/3ML) 0.083% nebulizer solution 2.5 mg (2.5 mg Nebulization Given 01/07/22 1416)    Initial Impression / Assessment and Plan / UC Course  I have reviewed the triage vital signs and the nursing notes.  Pertinent labs & imaging results that were available during my care of the patient were reviewed by me and considered in my medical decision making (see chart for details).     Consistent with asthma exacerbation secondary to uncontrolled seasonal allergies.  Albuterol nebulizer treatment given in clinic, IM Solu-Medrol administered as well.  Will refill inhaler regimen of albuterol and Flovent, add Astelin to Singulair regimen and also provided nebulizer solution for home use as needed.  Discussed supportive measures over-the-counter and return precautions.  No evidence of pneumonia at this time.  Final Clinical Impressions(s) / UC Diagnoses   Final diagnoses:  Seasonal allergic rhinitis due to other allergic trigger  Moderate persistent asthma with acute exacerbation   Discharge Instructions   None    ED Prescriptions     Medication Sig Dispense Auth. Provider   albuterol (VENTOLIN HFA) 108 (90 Base) MCG/ACT inhaler Inhale 2 puffs into the lungs every 6 (six) hours as needed for wheezing or shortness of breath. 18 g Volney American, [place]   azelastine (ASTELIN) 0.1 % nasal spray Place 1 spray into both nostrils 2 (two) times daily. Use in each nostril as directed 30 mL Volney American, PA-C   fluticasone (FLOVENT HFA) 110 MCG/ACT inhaler Inhale 1 puff  into the lungs 2 (two) times daily as needed (shortness of breath). 1 each Volney American, PA-C   albuterol (PROVENTIL) (2.5 MG/3ML) 0.083% nebulizer solution Take 3 mLs (2.5 mg total) by nebulization every 6 (six) hours as needed for wheezing or shortness of breath. 75 mL Volney American, [place]       PDMP not reviewed this encounter.   Volney American, [place] 01/07/22 1434

## 2022-01-07 NOTE — ED Triage Notes (Signed)
Pt reports asthma has flare up since trees have been cut at apartment complex this week. Pt reports post nasal drip and states intermittent hoarseness and brown-yellow sputum. History of pneumonia.  Pt reports current prescription for inhaler and nebs is expired and reports pcp was full and could not be seen today. Pt reports albuterol "gives me thrush".

## 2022-01-13 ENCOUNTER — Ambulatory Visit
Admission: RE | Admit: 2022-01-13 | Discharge: 2022-01-13 | Disposition: A | Discharge status: Home / Self Care | Payer: Medicaid Other | Source: Ambulatory Visit | Attending: Nurse Practitioner | Admitting: Nurse Practitioner

## 2022-01-13 VITALS — BP 167/98 | HR 89 | Temp 98.4°F | Resp 18

## 2022-01-13 DIAGNOSIS — J0140 Acute pansinusitis, unspecified: Secondary | ICD-10-CM

## 2022-01-13 MED ORDER — DOXYCYCLINE HYCLATE 100 MG PO TABS: 100.0000 mg | ORAL_TABLET | Freq: Two times a day (BID) | ORAL | 0 refills | Status: AC

## 2022-01-13 NOTE — ED Triage Notes (Signed)
Pt reports she has a sinus infection. Cough x 1 week. Asthma treatment gives no relief. Pt is concern as she do not wants the sinus infection to go to her lungs.

## 2022-01-13 NOTE — ED Provider Notes (Signed)
[facility]    CSN: [id number] Arrival date & time: 01/13/22  1039      History   Chief Complaint Chief Complaint  Patient presents with   Nasal Congestion    I have a sinus infection. I be coughing up and blowing out brown to green mucus. I am taking asthma treatments to get most of it up, but it will not go away. The Asthma treatments are not cutting it. - Entered by patient   Appointment    1100    HPI Yenesis Even Giese is a 61 y.o. female.   The history is provided by the patient.   Patient presents with a more than 1 week history of continued nasal congestion, cough, and sinus pressure.  She also states that she has been wheezing.  Patient states that she also woke up with a headache this morning.  Patient states she has nasal drainage that is now brown and green.  Patient was seen in this clinic on 01/07/2022 for asthma symptoms that developed after she was around trees that were recently cut down at her apartment complex.  She states since that time, she has had worsening nasal congestion, she now has a cough that is productive.  She has been using an albuterol inhaler and is currently on steroids previously prescribed.  Patient denies new fever, chills, sore throat, abdominal pain, nausea, vomiting, or diarrhea. Past Medical History:  Diagnosis Date   Asthma    Bone spur    Carpal tunnel syndrome    Complication of anesthesia    Patient didnt wake up for 24 hours after anesthesia   Diabetes ([place])    HTN (hypertension)    IBS (irritable bowel syndrome)    chronic diarrhea   Paresthesias    left side   Pneumonia 03/2020   Sciatica     Patient Active Problem List   Diagnosis Date Noted   History of adenomatous polyp of colon 07/04/2020   Acute hypoxemic respiratory failure due to COVID-19 [facility]) 03/30/2020   Hepatic cirrhosis ([place]) 01/19/2018   Colon cancer screening 01/19/2018   Chronic diarrhea 12/31/2011    Past Surgical History:  Procedure  Laterality Date   ABDOMINAL HYSTERECTOMY     partial   CARPAL TUNNEL RELEASE     [place], [place]   CESAREAN SECTION     COLONOSCOPY  07/18/2010   Fields-SIMPLE ADENOMA (Next colonoscopy  06/2020)   COLONOSCOPY WITH PROPOFOL N/A 08/20/2020   Procedure: COLONOSCOPY WITH PROPOFOL;  Surgeon: Eloise Harman, DO;  Location: [facility];  Service: Endoscopy;  Laterality: N/A;  9:00am    OB History   No obstetric history on file.      Home Medications    Prior to Admission medications   Medication Sig Start Date End Date Taking? Authorizing Provider  doxycycline (VIBRA-TABS) 100 MG tablet Take 1 tablet (100 mg total) by mouth 2 (two) times daily for 7 days. 01/13/22 01/20/22 Yes Harvest Stanco-Warren, Alda Lea, NP  albuterol (PROVENTIL) (2.5 MG/3ML) 0.083% nebulizer solution Take 3 mLs (2.5 mg total) by nebulization every 6 (six) hours as needed for wheezing or shortness of breath. 01/07/22   Volney American, PA-C  albuterol (VENTOLIN HFA) 108 (90 Base) MCG/ACT inhaler Inhale 2 puffs into the lungs every 6 (six) hours as needed for wheezing or shortness of breath. 01/07/22   Volney American, PA-C  Artificial Tear Ointment (DRY EYES OP) Place 1 drop into both eyes daily as needed (dry eyes).  [provider]  azelastine (ASTELIN) 0.1 % nasal spray Place 1 spray into both nostrils 2 (two) times daily. Use in each nostril as directed 01/07/22   Volney American, PA-C  CALCIUM-MAGNESIUM-VITAMIN D PO Take 1 tablet by mouth daily.    [provider]  Charcoal Activated 260 MG CAPS Take 260 mg by mouth See admin instructions. Every 10 days    [provider]  CINNAMON PO Take 1,000 mg by mouth daily.    [provider]  Coenzyme Q10 (COQ10) 200 MG CAPS Take 200 mg by mouth daily.    [provider]  Cyanocobalamin (VITAMIN B-12 PO) Take 500 mcg by mouth daily.    [provider]  cyclobenzaprine (FLEXERIL) 5 MG tablet Take 1  tablet (5 mg total) by mouth 2 (two) times daily as needed for muscle spasms. 12/09/21   Mordecai Rasmussen, MD  Flaxseed, Linseed, (FLAXSEED OIL PO) Take 1,000 mg by mouth daily.    [provider]  fluticasone (FLOVENT HFA) 110 MCG/ACT inhaler Inhale 1 puff into the lungs 2 (two) times daily as needed (shortness of breath). 01/07/22   Volney American, PA-C  lisinopril (ZESTRIL) 10 MG tablet Take 5 mg by mouth in the morning and at bedtime. 09/11/19   [provider]  Menthol-Methyl Salicylate (MUSCLE RUB) 10-15 % CREA Apply 1 application topically as needed for muscle pain.    [provider]  Misc Natural Products (TART CHERRY ADVANCED PO) Take 1 capsule by mouth daily.    [provider]  montelukast (SINGULAIR) 10 MG tablet Take 10 mg by mouth at bedtime.    [provider]  naproxen (NAPROSYN) 375 MG tablet Take 1 tablet (375 mg total) by mouth 2 (two) times daily as needed. Take with food to prevent GI upset Patient not taking: Reported on 12/09/2021 09/05/21   Eulogio Bear, NP  OVER THE COUNTER MEDICATION Take 1 capsule by mouth every evening. Doterra deep blue    [provider]  Probiotic Product (PROBIOTIC DAILY PO) Take 1 each by mouth daily. Gummy    [provider]  TURMERIC PO Take 1,000 mg by mouth daily.    [provider]    Family History Family History  Problem Relation Age of Onset   Coronary artery disease Father    Diabetes Father    Brain cancer Brother    Diverticulitis Sister    Colon cancer Neg Hx    Colon polyps Neg Hx     Social History Social History   Tobacco Use   Smoking status: Never   Smokeless tobacco: Never  Vaping Use   Vaping Use: Never used  Substance Use Topics   Alcohol use: Yes    Comment: occas   Drug use: No     Allergies   Equal [aspartame], Latex, Codeine, Amoxicillin, Erythromycin, Soy allergy, Sulfa antibiotics, Sulfa drugs cross reactors, and  Other   Review of Systems Review of Systems Per HPI  Physical Exam Triage Vital Signs ED Triage Vitals  Enc Vitals Group     BP 01/13/22 1051 (!) 167/98     Pulse Rate 01/13/22 1051 89     Resp 01/13/22 1051 18     Temp 01/13/22 1051 98.4 F (36.9 C)     Temp Source 01/13/22 1051 Oral     SpO2 01/13/22 1051 91 %     Weight --      Height --  Head Circumference --      Peak Flow --      Pain Score 01/13/22 1052 0     Pain Loc --      Pain Edu? --      Excl. in [place]? --    No data found.  Updated Vital Signs BP (!) 167/98 (BP Location: Right Arm)   Pulse 89   Temp 98.4 F (36.9 C) (Oral)   Resp 18   SpO2 91%   Visual Acuity Right Eye Distance:   Left Eye Distance:   Bilateral Distance:    Right Eye Near:   Left Eye Near:    Bilateral Near:     Physical Exam Vitals and nursing note reviewed.  Constitutional:      General: She is not in acute distress.    Appearance: Normal appearance.  HENT:     Head: Normocephalic.     Right Ear: Tympanic membrane, ear canal and external ear normal.     Left Ear: Tympanic membrane, ear canal and external ear normal.     Nose: Congestion present. No rhinorrhea.     Right Turbinates: Enlarged and swollen.     Left Turbinates: Enlarged and swollen.     Right Sinus: Maxillary sinus tenderness and frontal sinus tenderness present.     Left Sinus: Maxillary sinus tenderness and frontal sinus tenderness present.     Mouth/Throat:     Lips: Pink.     Mouth: Mucous membranes are moist.     Pharynx: Uvula midline. Posterior oropharyngeal erythema present. No pharyngeal swelling or uvula swelling.     Tonsils: No tonsillar exudate.  Eyes:     Extraocular Movements: Extraocular movements intact.     Conjunctiva/sclera: Conjunctivae normal.     Pupils: Pupils are equal, round, and reactive to light.  Cardiovascular:     Rate and Rhythm: Normal rate and regular rhythm.     Pulses: Normal pulses.     Heart sounds: Normal heart  sounds.  Pulmonary:     Effort: Pulmonary effort is normal.     Breath sounds: Wheezing present.  Abdominal:     General: Bowel sounds are normal.     Palpations: Abdomen is soft.     Tenderness: There is no abdominal tenderness.  Musculoskeletal:     Cervical back: Normal range of motion.  Lymphadenopathy:     Cervical: No cervical adenopathy.  Skin:    General: Skin is warm and dry.  Neurological:     General: No focal deficit present.     Mental Status: She is alert and oriented to person, place, and time.  Psychiatric:        Mood and Affect: Mood normal.        Behavior: Behavior normal.      UC Treatments / Results  Labs (all labs ordered are listed, but only abnormal results are displayed) Labs Reviewed - No data to display  EKG   Radiology No results found.  Procedures Procedures (including critical care time)  Medications Ordered in UC Medications - No data to display  Initial Impression / Assessment and Plan / UC Course  I have reviewed the triage vital signs and the nursing notes.  Pertinent labs & imaging results that were available during my care of the patient were reviewed by me and considered in my medical decision making (see chart for details).  Patient presents for continued upper respiratory symptoms that started approximately more than 1 week ago.  She  continues to have worsening nasal congestion, wheezing, and a productive cough.  On exam, her vital signs are stable, she is in no acute distress.  She does have maxillary and frontal sinus tenderness, consistent with an acute pansinusitis based on her continued symptoms, new onset mucopurulent drainage, and duration of her current symptoms..  We will treat patient with doxycycline 100 mg.  Patient is currently using albuterol and methylprednisolone, will have patient continue these medications.  Supportive care recommendations were provided to the patient.  Patient was advised to follow-up with her  primary care physician if symptoms fail to improve.  Patient verbalizes understanding.  All questions were answered.  Patient is stable for discharge. Final Clinical Impressions(s) / UC Diagnoses   Final diagnoses:  Acute pansinusitis, recurrence not specified     Discharge Instructions      Take medication as directed. Continue your current allergy medication regimen. Increase fluids and get plenty of rest. May take over-the-counter ibuprofen or Tylenol as needed for pain, fever, or general discomfort. Recommend normal saline nasal spray to help with nasal congestion throughout the day. For your cough, it may be helpful to use a humidifier at bedtime during sleep. Follow-up with your PCP if your symptoms do not improve with this treatment regimen.     ED Prescriptions     Medication Sig Dispense Auth. Provider   doxycycline (VIBRA-TABS) 100 MG tablet Take 1 tablet (100 mg total) by mouth 2 (two) times daily for 7 days. 14 tablet Cydnee Fuquay-Warren, Alda Lea, NP      PDMP not reviewed this encounter.   Tish Men, NP 01/13/22 1134

## 2022-01-13 NOTE — Discharge Instructions (Addendum)
Take medication as directed. Continue your current allergy medication regimen. Increase fluids and get plenty of rest. May take over-the-counter ibuprofen or Tylenol as needed for pain, fever, or general discomfort. Recommend normal saline nasal spray to help with nasal congestion throughout the day. For your cough, it may be helpful to use a humidifier at bedtime during sleep. Follow-up with your PCP if your symptoms do not improve with this treatment regimen.

## 2022-02-03 ENCOUNTER — Ambulatory Visit: Payer: Medicaid Other | Admitting: Orthopedic Surgery

## 2022-02-03 ENCOUNTER — Encounter: Payer: Self-pay | Admitting: Orthopedic Surgery

## 2022-02-03 VITALS — Ht 64.5 in | Wt 227.0 lb

## 2022-02-03 DIAGNOSIS — M19012 Primary osteoarthritis, left shoulder: Secondary | ICD-10-CM

## 2022-02-03 NOTE — Progress Notes (Signed)
Return Patient Visit  Assessment: Megan Salazar is a 61 y.o. female with the following: 1. Glenohumeral arthritis, left  Plan: Emalee A Querry has severe left glenohumeral joint arthritis.  It is too early to proceed with another glenohumeral joint injection.  This was discussed with the patient, and she states her understanding.  I believe that the swelling, and numbness and tingling she is experiencing in her left hand is secondary to her limited activity.  She is in agreement.  If she continues to have issues such as numbness and tingling in her left hand, she will contact the clinic.  Otherwise, we can proceed with a glenohumeral joint injection in approximately 6 weeks.  Follow-up: Return if symptoms worsen or fail to improve.  Subjective:  Chief Complaint  Patient presents with   Shoulder Pain    LT shoulder     History of Present Illness: Megan Salazar is a 61 y.o. female who returns to clinic today for repeat evaluation of her left shoulder pain.  Since I last saw her in clinic, she has had a glenohumeral joint injection.  This provided relief for approximately 1 month.  Since then, the pain in her shoulder has returned.  Due to other medical issues, she has been unable to return to the pool.  This is disappointing for her.  Over the past couple weeks, she is starting to notice some numbness and tingling getting worse in her left hand.  In addition, she has noted some swelling in her distal forearm.    Review of Systems: No fevers or chills No numbness or tingling No chest pain No shortness of breath No bowel or bladder dysfunction No GI distress No headaches   Objective: Ht 5' 4.5" (1.638 m)   Wt 227 lb (103 kg)   BMI 38.36 kg/m   Physical Exam:  General: Alert and oriented. and No acute distress. Gait: Normal gait.  Evaluation left hand demonstrates some mild swelling on the dorsum of the hand.  No point tenderness.  Some pain with range of  motion.  She has severely limited range of motion in her left shoulder.  Difficulty getting her arm to 90 degrees of forward flexion.  Difficulty getting her arm to her lower back.  There is crepitus with range of motion testing.  IMAGING: I personally ordered and reviewed the following images  No new imaging obtained today  New Medications:  No orders of the defined types were placed in this encounter.     Mordecai Rasmussen, MD  02/03/2022 6:55 PM

## 2022-02-18 ENCOUNTER — Other Ambulatory Visit (HOSPITAL_COMMUNITY): Payer: Self-pay | Admitting: Internal Medicine

## 2022-02-18 DIAGNOSIS — Z1231 Encounter for screening mammogram for malignant neoplasm of breast: Secondary | ICD-10-CM

## 2022-02-20 ENCOUNTER — Other Ambulatory Visit: Payer: Self-pay | Admitting: Orthopedic Surgery

## 2022-03-14 ENCOUNTER — Ambulatory Visit
Admission: RE | Admit: 2022-03-14 | Discharge: 2022-03-14 | Disposition: A | Discharge status: Home / Self Care | Payer: Medicaid Other | Source: Ambulatory Visit | Attending: Nurse Practitioner | Admitting: Nurse Practitioner

## 2022-03-14 VITALS — BP 178/84 | HR 103 | Temp 99.6°F | Resp 18

## 2022-03-14 DIAGNOSIS — Z1152 Encounter for screening for COVID-19: Secondary | ICD-10-CM | POA: Diagnosis not present

## 2022-03-14 DIAGNOSIS — J309 Allergic rhinitis, unspecified: Secondary | ICD-10-CM | POA: Diagnosis not present

## 2022-03-14 MED ORDER — CETIRIZINE-PSEUDOEPHEDRINE ER 5-120 MG PO TB12: 1.0000 | ORAL_TABLET | Freq: Two times a day (BID) | ORAL | 0 refills | Status: AC

## 2022-03-14 MED ORDER — BENZONATATE 100 MG PO CAPS: 100.0000 mg | ORAL_CAPSULE | Freq: Three times a day (TID) | ORAL | 0 refills | Status: AC | PRN

## 2022-03-14 MED ORDER — DOXYCYCLINE HYCLATE 100 MG PO CAPS: 100.0000 mg | ORAL_CAPSULE | Freq: Two times a day (BID) | ORAL | 0 refills | Status: AC

## 2022-03-14 NOTE — ED Triage Notes (Signed)
Pt reports cough ad nasal congestion x 4 days. Reports she is allergic to OTC cold meds.

## 2022-03-14 NOTE — ED Provider Notes (Addendum)
[facility]    CSN: [id number] Arrival date & time: 03/14/22  1149      History   Chief Complaint Chief Complaint  Patient presents with   Cough    Entered by patient   Appointment    1200    HPI Megan Salazar is a 61 y.o. female.   The history is provided by the patient.   The patient presents with a 4-day history of cough, nasal congestion, and hoarseness.  Patient states that she does have a productive cough that starts off darker in the mornings.  Patient also complains of postnasal drainage.  Patient states prior to her symptoms starting, there were 3 trees cut down in her neighborhood which most likely triggered her allergies.  She denies fever, chills, wheezing, shortness of breath, or difficulty breathing.  She reports she has been doing more natural treatments such as increasing fluids, warm salt water rinses, and increasing her vitamins as she is allergic to over-the-counter cold medications. Past Medical History:  Diagnosis Date   Asthma    Bone spur    Carpal tunnel syndrome    Complication of anesthesia    Patient didnt wake up for 24 hours after anesthesia   Diabetes ([place])    HTN (hypertension)    IBS (irritable bowel syndrome)    chronic diarrhea   Paresthesias    left side   Pneumonia 03/2020   Sciatica     Patient Active Problem List   Diagnosis Date Noted   History of adenomatous polyp of colon 07/04/2020   Acute hypoxemic respiratory failure due to COVID-19 [facility]) 03/30/2020   Hepatic cirrhosis ([place]) 01/19/2018   Colon cancer screening 01/19/2018   Chronic diarrhea 12/31/2011    Past Surgical History:  Procedure Laterality Date   ABDOMINAL HYSTERECTOMY     partial   CARPAL TUNNEL RELEASE     bilat, [place]   CESAREAN SECTION     COLONOSCOPY  07/18/2010   Fields-SIMPLE ADENOMA (Next colonoscopy  06/2020)   COLONOSCOPY WITH PROPOFOL N/A 08/20/2020   Procedure: COLONOSCOPY WITH PROPOFOL;  Surgeon: Eloise Harman, DO;   [facility];  Service: Endoscopy;  Laterality: N/A;  9:00am    OB History   No obstetric history on file.      Home Medications    Prior to Admission medications   Medication Sig Start Date End Date Taking? Authorizing Provider  benzonatate (TESSALON) 100 MG capsule Take 1 capsule (100 mg total) by mouth 3 (three) times daily as needed for up to 10 days for cough. 03/14/22 03/24/22 Yes Branton Einstein-Warren, Alda Lea, NP  cetirizine-pseudoephedrine (ZYRTEC-D) 5-120 MG tablet Take 1 tablet by mouth 2 (two) times daily for 15 days. 03/14/22 03/29/22 Yes Tifanie Gardiner-Warren, Alda Lea, NP  doxycycline (VIBRAMYCIN) 100 MG capsule Take 1 capsule (100 mg total) by mouth 2 (two) times daily for 7 days. 03/20/22 03/27/22 Yes Merly Hinkson-Warren, Alda Lea, NP  albuterol (PROVENTIL) (2.5 MG/3ML) 0.083% nebulizer solution Take 3 mLs (2.5 mg total) by nebulization every 6 (six) hours as needed for wheezing or shortness of breath. 01/07/22   Volney American, PA-C  albuterol (VENTOLIN HFA) 108 (90 Base) MCG/ACT inhaler Inhale 2 puffs into the lungs every 6 (six) hours as needed for wheezing or shortness of breath. 01/07/22   Volney American, PA-C  Artificial Tear Ointment (DRY EYES OP) Place 1 drop into both eyes daily as needed (dry eyes).    [provider]  azelastine (ASTELIN) 0.1 %  nasal spray Place 1 spray into both nostrils 2 (two) times daily. Use in each nostril as directed 01/07/22   Volney American, PA-C  CALCIUM-MAGNESIUM-VITAMIN D PO Take 1 tablet by mouth daily.    [provider]  Charcoal Activated 260 MG CAPS Take 260 mg by mouth See admin instructions. Every 10 days    [provider]  CINNAMON PO Take 1,000 mg by mouth daily.    [provider]  Coenzyme Q10 (COQ10) 200 MG CAPS Take 200 mg by mouth daily.    [provider]  Cyanocobalamin (VITAMIN B-12 PO) Take 500 mcg by mouth daily.    [provider]  cyclobenzaprine  (FLEXERIL) 5 MG tablet TAKE (1) TABLET BY MOUTH TWICE DAILY AS NEEDED. 02/20/22   Mordecai Rasmussen, MD  Flaxseed, Linseed, (FLAXSEED OIL PO) Take 1,000 mg by mouth daily.    [provider]  fluticasone (FLOVENT HFA) 110 MCG/ACT inhaler Inhale 1 puff into the lungs 2 (two) times daily as needed (shortness of breath). 01/07/22   Volney American, PA-C  lisinopril (ZESTRIL) 10 MG tablet Take 5 mg by mouth in the morning and at bedtime. 09/11/19   [provider]  Menthol-Methyl Salicylate (MUSCLE RUB) 10-15 % CREA Apply 1 application topically as needed for muscle pain.    [provider]  Misc Natural Products (TART CHERRY ADVANCED PO) Take 1 capsule by mouth daily.    [provider]  montelukast (SINGULAIR) 10 MG tablet Take 10 mg by mouth at bedtime.    [provider]  naproxen (NAPROSYN) 375 MG tablet Take 1 tablet (375 mg total) by mouth 2 (two) times daily as needed. Take with food to prevent GI upset Patient not taking: Reported on 12/09/2021 09/05/21   Eulogio Bear, NP  OVER THE COUNTER MEDICATION Take 1 capsule by mouth every evening. Doterra deep blue    [provider]  Probiotic Product (PROBIOTIC DAILY PO) Take 1 each by mouth daily. Gummy    [provider]  TURMERIC PO Take 1,000 mg by mouth daily.    [provider]    Family History Family History  Problem Relation Age of Onset   Coronary artery disease Father    Diabetes Father    Brain cancer Brother    Diverticulitis Sister    Colon cancer Neg Hx    Colon polyps Neg Hx     Social History Social History   Tobacco Use   Smoking status: Never   Smokeless tobacco: Never  Vaping Use   Vaping Use: Never used  Substance Use Topics   Alcohol use: Yes    Comment: occas   Drug use: No     Allergies   Equal [aspartame], Latex, Codeine, Amoxicillin, Erythromycin, Soy allergy, Sulfa antibiotics, Sulfa drugs cross reactors, and  Other   Review of Systems Review of Systems Per HPI  Physical Exam Triage Vital Signs ED Triage Vitals  Enc Vitals Group     BP 03/14/22 1218 (!) 178/84     Pulse Rate 03/14/22 1218 (!) 103     Resp 03/14/22 1218 18     Temp 03/14/22 1218 99.6 F (37.6 C)     Temp Source 03/14/22 1218 Oral     SpO2 03/14/22 1218 92 %     Weight --      Height --      Head Circumference --      Peak Flow --  Pain Score 03/14/22 1217 0     Pain Loc --      Pain Edu? --      Excl. in [place]? --    No data found.  Updated Vital Signs BP (!) 178/84 (BP Location: Right Arm)   Pulse (!) 103   Temp 99.6 F (37.6 C) (Oral)   Resp 18   SpO2 92%   Visual Acuity Right Eye Distance:   Left Eye Distance:   Bilateral Distance:    Right Eye Near:   Left Eye Near:    Bilateral Near:     Physical Exam Vitals and nursing note reviewed.  Constitutional:      General: She is not in acute distress.    Appearance: Normal appearance.  HENT:     Head: Normocephalic.     Right Ear: Tympanic membrane, ear canal and external ear normal.     Left Ear: Tympanic membrane, ear canal and external ear normal.     Nose: Congestion and rhinorrhea present.     Right Turbinates: Enlarged and swollen.     Left Turbinates: Enlarged and swollen.     Right Sinus: No maxillary sinus tenderness or frontal sinus tenderness.     Left Sinus: No maxillary sinus tenderness or frontal sinus tenderness.     Mouth/Throat:     Lips: Pink.     Mouth: Mucous membranes are moist.     Pharynx: Oropharynx is clear. Uvula midline. Posterior oropharyngeal erythema present. No pharyngeal swelling.  Eyes:     Extraocular Movements: Extraocular movements intact.     Conjunctiva/sclera: Conjunctivae normal.     Pupils: Pupils are equal, round, and reactive to light.  Cardiovascular:     Rate and Rhythm: Regular rhythm. Tachycardia present.     Pulses: Normal pulses.     Heart sounds: Normal heart sounds.  Pulmonary:      Effort: Pulmonary effort is normal. No respiratory distress.     Breath sounds: Normal breath sounds. No stridor. No wheezing, rhonchi or rales.  Abdominal:     General: Bowel sounds are normal.     Palpations: Abdomen is soft.     Tenderness: There is no abdominal tenderness.  Musculoskeletal:     Cervical back: Normal range of motion.  Lymphadenopathy:     Cervical: No cervical adenopathy.  Skin:    General: Skin is warm and dry.  Neurological:     General: No focal deficit present.     Mental Status: She is alert and oriented to person, place, and time.  Psychiatric:        Mood and Affect: Mood normal.        Behavior: Behavior normal.      UC Treatments / Results  Labs (all labs ordered are listed, but only abnormal results are displayed) Labs Reviewed  SARS CORONAVIRUS 2 (TAT 6-24 HRS)    EKG   Radiology No results found.  Procedures Procedures (including critical care time)  Medications Ordered in UC Medications - No data to display  Initial Impression / Assessment and Plan / UC Course  I have reviewed the triage vital signs and the nursing notes.  Pertinent labs & imaging results that were available during my care of the patient were reviewed by me and considered in my medical decision making (see chart for details).  The patient is well-appearing, she is in no acute distress, she is hypertensive and mildly tachycardic.  COVID test is pending.  Symptoms appear to be  consistent with allergic rhinitis, patient informs that symptoms started prior to trees in her neighborhood being cut down.  Patient is limited to medications that can be prescribed.  Is a candidate to receive molnupiravir if her COVID test is positive.  Discussed with patient that we will start Tessalon 100 mg to help with her cough, and Zyrtec-D for her nasal congestion.  Patient was advised to continue supportive care recommendations.  If symptoms worsen, patient has been sent a prescription for  doxycycline 100 mg to begin on 03/20/2022.  Patient was advised that if symptoms worsen before that time, to contact this clinic to have the medication released before that time.  Patient verbalizes understanding.  All questions were answered.  Patient is stable for discharge. Final Clinical Impressions(s) / UC Diagnoses   Final diagnoses:  Allergic rhinitis, unspecified seasonality, unspecified trigger     Discharge Instructions      COVID test is pending.  You will be contacted if the pending test results are positive.  As discussed, you are a candidate to receive molnupiravir if your COVID test is positive. Take medication as prescribed.  Continue your current medications at this time. Increase fluids and allow for plenty of rest. Recommend Tylenol or ibuprofen as needed for pain, fever, or general discomfort. Warm salt water gargles 3-4 times daily to help with throat pain or discomfort. As discussed, if your symptoms do not improve by 03/20/22, pick up your prescription for the antibiotic at your preferred pharmacy. Recommend using a humidifier at bedtime during sleep to help with cough and nasal congestion. Sleep elevated on 2 pillows. Follow-up with your primary care physician if your symptoms do not improve.     ED Prescriptions     Medication Sig Dispense Auth. Provider   benzonatate (TESSALON) 100 MG capsule Take 1 capsule (100 mg total) by mouth 3 (three) times daily as needed for up to 10 days for cough. 30 capsule Nusayba Cadenas-Warren, Alda Lea, NP   cetirizine-pseudoephedrine (ZYRTEC-D) 5-120 MG tablet Take 1 tablet by mouth 2 (two) times daily for 15 days. 30 tablet Chriselda Leppert-Warren, Alda Lea, NP   doxycycline (VIBRAMYCIN) 100 MG capsule Take 1 capsule (100 mg total) by mouth 2 (two) times daily for 7 days. 14 capsule Kaula Klenke-Warren, Alda Lea, NP      PDMP not reviewed this encounter.   Tish Men, NP 03/14/22 1250    Tish Men, NP 03/14/22  1251

## 2022-03-14 NOTE — Discharge Instructions (Addendum)
COVID test is pending.  You will be contacted if the pending test results are positive.  As discussed, you are a candidate to receive molnupiravir if your COVID test is positive. Take medication as prescribed.  Continue your current medications at this time. Increase fluids and allow for plenty of rest. Recommend Tylenol or ibuprofen as needed for pain, fever, or general discomfort. Warm salt water gargles 3-4 times daily to help with throat pain or discomfort. As discussed, if your symptoms do not improve by 03/20/22, pick up your prescription for the antibiotic at your preferred pharmacy. Recommend using a humidifier at bedtime during sleep to help with cough and nasal congestion. Sleep elevated on 2 pillows. Follow-up with your primary care physician if your symptoms do not improve.

## 2022-03-15 LAB — SARS CORONAVIRUS 2 (TAT 6-24 HRS): SARS Coronavirus 2: NEGATIVE

## 2022-03-19 ENCOUNTER — Ambulatory Visit (HOSPITAL_COMMUNITY)
Admission: RE | Admit: 2022-03-19 | Discharge: 2022-03-19 | Disposition: A | Discharge status: Home / Self Care | Payer: Medicaid Other | Source: Ambulatory Visit | Attending: Internal Medicine | Admitting: Internal Medicine

## 2022-03-19 DIAGNOSIS — Z1231 Encounter for screening mammogram for malignant neoplasm of breast: Secondary | ICD-10-CM | POA: Insufficient documentation

## 2022-04-02 ENCOUNTER — Ambulatory Visit: Payer: Medicaid Other

## 2022-04-02 ENCOUNTER — Ambulatory Visit
Admission: EM | Admit: 2022-04-02 | Discharge: 2022-04-02 | Disposition: A | Discharge status: Home / Self Care | Payer: Medicaid Other | Attending: Nurse Practitioner | Admitting: Nurse Practitioner

## 2022-04-02 ENCOUNTER — Ambulatory Visit (INDEPENDENT_AMBULATORY_CARE_PROVIDER_SITE_OTHER): Payer: Medicaid Other

## 2022-04-02 ENCOUNTER — Encounter: Payer: Self-pay | Admitting: Emergency Medicine

## 2022-04-02 DIAGNOSIS — M79671 Pain in right foot: Secondary | ICD-10-CM

## 2022-04-02 NOTE — ED Provider Notes (Signed)
[facility]    CSN: [id number] Arrival date & time: 04/02/22  1327      History   Chief Complaint No chief complaint on file.   HPI Megan Salazar is a 62 y.o. female.   Patient presents today for right foot pain.  Reports the pain began on Sunday, when she was stepping out of her car at church she stepped onto grass and felt and heard a crack in her foot.  She reports pain thereafter.  She has been able to bear weight, however has to modify how she walks so it does not cause pain.  Reports the entire foot has been swollen.  No redness or bruising.  No pain to palpation.  She does have pain when she flexes her foot or bears weight, otherwise no pain.  No numbness or tingling in the toes.  No fevers or nausea/vomiting since the pain began.  Has not take anything for the pain so far.  Has been keeping her foot elevated in her recliner.  No open wounds on her foot.    Past Medical History:  Diagnosis Date   Asthma    Bone spur    Carpal tunnel syndrome    Complication of anesthesia    Patient didnt wake up for 24 hours after anesthesia   Diabetes ([place])    HTN (hypertension)    IBS (irritable bowel syndrome)    chronic diarrhea   Paresthesias    left side   Pneumonia 03/2020   Sciatica     Patient Active Problem List   Diagnosis Date Noted   History of adenomatous polyp of colon 07/04/2020   Acute hypoxemic respiratory failure due to COVID-19 [facility]) 03/30/2020   Hepatic cirrhosis ([place]) 01/19/2018   Colon cancer screening 01/19/2018   Chronic diarrhea 12/31/2011    Past Surgical History:  Procedure Laterality Date   ABDOMINAL HYSTERECTOMY     partial   CARPAL TUNNEL RELEASE     bilat, [place]   CESAREAN SECTION     COLONOSCOPY  07/18/2010   Fields-SIMPLE ADENOMA (Next colonoscopy  06/2020)   COLONOSCOPY WITH PROPOFOL N/A 08/20/2020   Procedure: COLONOSCOPY WITH PROPOFOL;  Surgeon: Eloise Harman, DO;  [facility];  Service: Endoscopy;   Laterality: N/A;  9:00am    OB History   No obstetric history on file.      Home Medications    Prior to Admission medications   Medication Sig Start Date End Date Taking? Authorizing Provider  albuterol (PROVENTIL) (2.5 MG/3ML) 0.083% nebulizer solution Take 3 mLs (2.5 mg total) by nebulization every 6 (six) hours as needed for wheezing or shortness of breath. 01/07/22   Volney American, PA-C  albuterol (VENTOLIN HFA) 108 (90 Base) MCG/ACT inhaler Inhale 2 puffs into the lungs every 6 (six) hours as needed for wheezing or shortness of breath. 01/07/22   Volney American, PA-C  Artificial Tear Ointment (DRY EYES OP) Place 1 drop into both eyes daily as needed (dry eyes).    [provider]  azelastine (ASTELIN) 0.1 % nasal spray Place 1 spray into both nostrils 2 (two) times daily. Use in each nostril as directed 01/07/22   Volney American, PA-C  CALCIUM-MAGNESIUM-VITAMIN D PO Take 1 tablet by mouth daily.    [provider]  Charcoal Activated 260 MG CAPS Take 260 mg by mouth See admin instructions. Every 10 days    [provider]  CINNAMON PO Take 1,000 mg by mouth  daily.    [provider]  Coenzyme Q10 (COQ10) 200 MG CAPS Take 200 mg by mouth daily.    [provider]  Cyanocobalamin (VITAMIN B-12 PO) Take 500 mcg by mouth daily.    [provider]  cyclobenzaprine (FLEXERIL) 5 MG tablet TAKE (1) TABLET BY MOUTH TWICE DAILY AS NEEDED. 02/20/22   Mordecai Rasmussen, MD  Flaxseed, Linseed, (FLAXSEED OIL PO) Take 1,000 mg by mouth daily.    [provider]  fluticasone (FLOVENT HFA) 110 MCG/ACT inhaler Inhale 1 puff into the lungs 2 (two) times daily as needed (shortness of breath). 01/07/22   Volney American, PA-C  lisinopril (ZESTRIL) 10 MG tablet Take 5 mg by mouth in the morning and at bedtime. 09/11/19   [provider]  Menthol-Methyl Salicylate (MUSCLE RUB) 10-15 % CREA Apply 1 application  topically as needed for muscle pain.    [provider]  Misc Natural Products (TART CHERRY ADVANCED PO) Take 1 capsule by mouth daily.    [provider]  montelukast (SINGULAIR) 10 MG tablet Take 10 mg by mouth at bedtime.    [provider]  naproxen (NAPROSYN) 375 MG tablet Take 1 tablet (375 mg total) by mouth 2 (two) times daily as needed. Take with food to prevent GI upset Patient not taking: Reported on 12/09/2021 09/05/21   Eulogio Bear, NP  OVER THE COUNTER MEDICATION Take 1 capsule by mouth every evening. Doterra deep blue    [provider]  Probiotic Product (PROBIOTIC DAILY PO) Take 1 each by mouth daily. Gummy    [provider]  TURMERIC PO Take 1,000 mg by mouth daily.    [provider]    Family History Family History  Problem Relation Age of Onset   Coronary artery disease Father    Diabetes Father    Brain cancer Brother    Diverticulitis Sister    Colon cancer Neg Hx    Colon polyps Neg Hx     Social History Social History   Tobacco Use   Smoking status: Never   Smokeless tobacco: Never  Vaping Use   Vaping Use: Never used  Substance Use Topics   Alcohol use: Yes    Comment: occas   Drug use: No     Allergies   Equal [aspartame], Latex, Codeine, Amoxicillin, Erythromycin, Soy allergy, Sulfa antibiotics, Sulfa drugs cross reactors, and Other   Review of Systems Review of Systems Per HPI  Physical Exam Triage Vital Signs ED Triage Vitals  Enc Vitals Group     BP 04/02/22 1333 (!) 153/80     Pulse Rate 04/02/22 1333 83     Resp 04/02/22 1333 18     Temp 04/02/22 1333 (!) 97.2 F (36.2 C)     Temp Source 04/02/22 1333 Oral     SpO2 04/02/22 1333 98 %     Weight --      Height --      Head Circumference --      Peak Flow --      Pain Score 04/02/22 1335 1     Pain Loc --      Pain Edu? --      Excl. in [place]? --    No data found.  Updated Vital Signs BP (!) 153/80 (BP Location:  Right Arm)   Pulse 83   Temp (!) 97.2 F (36.2 C) (Oral)   Resp 18   SpO2 98%   Visual Acuity  Right Eye Distance:   Left Eye Distance:   Bilateral Distance:    Right Eye Near:   Left Eye Near:    Bilateral Near:     Physical Exam Vitals and nursing note reviewed.  Constitutional:      General: She is not in acute distress.    Appearance: Normal appearance. She is not toxic-appearing.  HENT:     Mouth/Throat:     Mouth: Mucous membranes are moist.     Pharynx: Oropharynx is clear.  Pulmonary:     Effort: Pulmonary effort is normal. No respiratory distress.  Musculoskeletal:     Right lower leg: Normal. No swelling, deformity, tenderness or bony tenderness. No edema.     Left lower leg: Normal. No swelling, deformity, tenderness or bony tenderness. No edema.     Right ankle: Swelling present. No ecchymosis. No tenderness. Normal range of motion. Anterior drawer test negative. Normal pulse.     Left ankle: Normal.     Right foot: Normal range of motion and normal capillary refill. Swelling present. No tenderness or bony tenderness. Normal pulse.     Left foot: Normal.     Comments: Inspection: 2+ pitting edema to right ankle spreading into midfoot; no obvious deformity, redness, or bruising Palpation: right foot and ankle nontender to palpation; no obvious deformities palpated ROM: Full ROM to ankle and flexibility of foot  Strength: 5/5 right foot Neurovascular: neurovascularly intact in right lower extremity   Skin:    General: Skin is warm and dry.     Capillary Refill: Capillary refill takes less than 2 seconds.     Coloration: Skin is not jaundiced or pale.     Findings: No erythema.  Neurological:     Mental Status: She is alert and oriented to person, place, and time.  Psychiatric:        Behavior: Behavior is cooperative.      UC Treatments / Results  Labs (all labs ordered are listed, but only abnormal results are displayed) Labs Reviewed - No data to  display  EKG   Radiology DG Foot Complete Right  Result Date: 04/02/2022 CLINICAL DATA:  Pain and swelling.  Trauma 4 days ago EXAM: RIGHT FOOT COMPLETE - 3 VIEW COMPARISON:  X-ray 12/08/2016 FINDINGS: Osteopenia. Moderate degenerative changes of the first metatarsophalangeal joint with osteophyte formation, joint space loss and subchondral cyst formation. No acute fracture or dislocation. Soft tissue swelling about the ankle. Question some soft tissue gas on the lateral view. IMPRESSION: Degenerative changes of the first metatarsophalangeal joint. Osteopenia. Soft tissue swelling identified. There is some lucency in the tissue surrounding the ankle. Please correlate for any evidence of soft tissue gas. Electronically Signed   By: Jill Side M.D.   On: 04/02/2022 13:51    Procedures Procedures (including critical care time)  Medications Ordered in UC Medications - No data to display  Initial Impression / Assessment and Plan / UC Course  I have reviewed the triage vital signs and the nursing notes.  Pertinent labs & imaging results that were available during my care of the patient were reviewed by me and considered in my medical decision making (see chart for details).   Patient is well-appearing, normotensive, afebrile, not tachycardic, not tachypneic, oxygenating well on room air.    Right foot pain Foot x-ray today negative for acute abnormality Suspect right foot strain/sprain Recommended use of Ace wrap for compression, elevation, ice, and rest Can also use ibuprofen as needed for pain Patient  declines Ace wrap-states she is allergic to it and also declines ice today because it "makes my whole body cold." ER and return precautions discussed with patient  The patient was given the opportunity to ask questions.  All questions answered to their satisfaction.  The patient is in agreement to this plan.    Final Clinical Impressions(s) / UC Diagnoses   Final diagnoses:  Right foot  pain     Discharge Instructions      I suspect you may have strained a ligament in your foot.  The x-ray today does not show any bony fractures.  I would recommend keeping your foot elevated, using a compression wrap, and applying ice to the foot to help with pain and swelling.  You can also take Tylenol or ibuprofen as needed for pain.  Follow-up with your primary care provider if no improvement or worsening of symptoms despite treatment.    ED Prescriptions   None    PDMP not reviewed this encounter.   Eulogio Bear, NP 04/02/22 1429

## 2022-04-02 NOTE — ED Triage Notes (Signed)
While stepping out of car on Sunday heard a crack.  Pain and swelling to right foot.  Pain around great toe and base of smaller toes.

## 2022-04-02 NOTE — Discharge Instructions (Signed)
I suspect you may have strained a ligament in your foot.  The x-ray today does not show any bony fractures.  I would recommend keeping your foot elevated, using a compression wrap, and applying ice to the foot to help with pain and swelling.  You can also take Tylenol or ibuprofen as needed for pain.  Follow-up with your primary care provider if no improvement or worsening of symptoms despite treatment.

## 2022-04-14 ENCOUNTER — Encounter: Payer: Self-pay | Admitting: Orthopedic Surgery

## 2022-04-14 ENCOUNTER — Ambulatory Visit (INDEPENDENT_AMBULATORY_CARE_PROVIDER_SITE_OTHER): Payer: Medicaid Other | Admitting: Orthopedic Surgery

## 2022-04-14 VITALS — Ht 64.5 in | Wt 227.0 lb

## 2022-04-14 DIAGNOSIS — M19012 Primary osteoarthritis, left shoulder: Secondary | ICD-10-CM

## 2022-04-14 NOTE — Patient Instructions (Signed)

## 2022-04-14 NOTE — Progress Notes (Signed)
Return Patient Visit  Assessment: Megan Salazar is a 62 y.o. female with the following: 1. 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  Prior injections have helped, although they have not fully alleviated her pain.  She states that she had more relief of her symptoms following the subacromial steroid injection.  As such, I recommended this injection today.  It was completed without issues.  She will follow-up as needed.  Procedure note injection Left shoulder    Verbal consent was obtained to inject the left shoulder, subacromial space Timeout was completed to confirm the site of injection.  The skin was prepped with alcohol and ethyl chloride was sprayed at the injection site.  A 21-gauge needle was used to inject 40 mg of Depo-Medrol and 1% lidocaine (3 cc) into the subacromial space of the left shoulder using a posterolateral approach.  There were no complications. A sterile bandage was applied.  Follow-up: Return if symptoms worsen or fail to improve.  Subjective:  Chief Complaint  Patient presents with   Injections    Lt shoulder  Lt shoulder  NDC: [address]-7 QQV:[id number] Exp: 01/25    History of Present Illness: Megan Salazar is a 62 y.o. female who returns to clinic today for repeat evaluation of her left shoulder pain.  She has known left glenohumeral joint arthritis.  She has had injections in the subacromial space, as well as image guided glenohumeral joint injections.  She states that the subacromial injection was more helpful.  Approximately 3 weeks ago, her pain acutely worsened.  No specific injury.  She started to hear and feels a grinding on a more consistent basis.  She is interested in another injection.    Review of Systems: No fevers or chills No numbness or tingling No chest pain No shortness of breath No bowel or bladder dysfunction No GI distress No  headaches   Objective: Ht 5' 4.5" (1.638 m)   Wt 227 lb (103 kg)   BMI 38.36 kg/m   Physical Exam:  General: Alert and oriented. and No acute distress. Gait: Normal gait.  Evaluation left hand demonstrates some mild swelling on the dorsum of the hand.  No point tenderness.  Some pain with range of motion.  She has severely limited range of motion in her left shoulder.  Difficulty getting her arm to 90 degrees of forward flexion.  Difficulty getting her arm to her lower back.  There is crepitus with range of motion testing.  IMAGING: I personally ordered and reviewed the following images  No new imaging obtained today  New Medications:  No orders of the defined types were placed in this encounter.     Mordecai Rasmussen, MD  04/14/2022 12:51 PM

## 2022-04-15 DIAGNOSIS — H5213 Myopia, bilateral: Secondary | ICD-10-CM | POA: Diagnosis not present

## 2022-05-19 ENCOUNTER — Ambulatory Visit: Payer: Medicaid Other | Admitting: Family Medicine

## 2022-05-19 ENCOUNTER — Encounter: Payer: Self-pay | Admitting: Family Medicine

## 2022-05-19 VITALS — BP 128/82 | HR 101 | Ht 63.0 in | Wt 206.0 lb

## 2022-05-19 DIAGNOSIS — R7301 Impaired fasting glucose: Secondary | ICD-10-CM | POA: Diagnosis not present

## 2022-05-19 DIAGNOSIS — E785 Hyperlipidemia, unspecified: Secondary | ICD-10-CM

## 2022-05-19 DIAGNOSIS — Z0001 Encounter for general adult medical examination with abnormal findings: Secondary | ICD-10-CM

## 2022-05-19 DIAGNOSIS — E039 Hypothyroidism, unspecified: Secondary | ICD-10-CM

## 2022-05-19 DIAGNOSIS — I10 Essential (primary) hypertension: Secondary | ICD-10-CM

## 2022-05-19 NOTE — Progress Notes (Signed)
Complete physical exam  Patient: Megan Salazar   DOB: 15-Mar-1961   62 y.o. Female  MRN: [id number]  Subjective:    Chief Complaint  Patient presents with   Megan Salazar is a 62 y.o. female who presents today for a complete physical exam. She reports consuming a general diet.  Patient swims daily but now taking a break due to her foot pain  She generally feels well. She reports sleeping well. She does have additional problems to discuss today regarding her right foot pain.   Most recent fall risk assessment:    05/19/2022    9:36 AM  Fall Risk   Falls in the past year? 0  Number falls in past yr: 0  Injury with Fall? 0  Risk for fall due to : No Fall Risks  Follow up Falls evaluation completed     Most recent depression screenings:    05/19/2022    9:36 AM  PHQ 2/9 Scores  PHQ - 2 Score 0  PHQ- 9 Score 1    Vision:Within last year  Patient Care Team: Del Ria Comment, Lamar Benes, FNP as PCP - General (Family Medicine) Eloise Harman, DO as Consulting Physician (Internal Medicine)   Outpatient Medications Prior to Visit  Medication Sig   albuterol (PROVENTIL) (2.5 MG/3ML) 0.083% nebulizer solution Take 3 mLs (2.5 mg total) by nebulization every 6 (six) hours as needed for wheezing or shortness of breath.   albuterol (VENTOLIN HFA) 108 (90 Base) MCG/ACT inhaler Inhale 2 puffs into the lungs every 6 (six) hours as needed for wheezing or shortness of breath.   Artificial Tear Ointment (DRY EYES OP) Place 1 drop into both eyes daily as needed (dry eyes).   CALCIUM-MAGNESIUM-VITAMIN D PO Take 1 tablet by mouth daily.   Charcoal Activated 260 MG CAPS Take 260 mg by mouth See admin instructions. Every 10 days   CINNAMON PO Take 1,000 mg by mouth daily.   Coenzyme Q10 (COQ10) 200 MG CAPS Take 200 mg by mouth daily.   Cyanocobalamin (VITAMIN B-12 PO) Take 500 mcg by mouth daily.   cyclobenzaprine (FLEXERIL) 5 MG tablet TAKE (1) TABLET BY MOUTH  TWICE DAILY AS NEEDED.   Flaxseed, Linseed, (FLAXSEED OIL PO) Take 1,000 mg by mouth daily.   fluticasone (FLOVENT HFA) 110 MCG/ACT inhaler Inhale 1 puff into the lungs 2 (two) times daily as needed (shortness of breath).   lisinopril (ZESTRIL) 10 MG tablet Take 5 mg by mouth in the morning and at bedtime.   Menthol-Methyl Salicylate (MUSCLE RUB) 10-15 % CREA Apply 1 application topically as needed for muscle pain.   Misc Natural Products (TART CHERRY ADVANCED PO) Take 1 capsule by mouth daily.   montelukast (SINGULAIR) 10 MG tablet Take 10 mg by mouth at bedtime.   naproxen (NAPROSYN) 375 MG tablet Take 1 tablet (375 mg total) by mouth 2 (two) times daily as needed. Take with food to prevent GI upset   OVER THE COUNTER MEDICATION Take 1 capsule by mouth every evening. Doterra deep blue   Probiotic Product (PROBIOTIC DAILY PO) Take 1 each by mouth daily. Gummy   TURMERIC PO Take 1,000 mg by mouth daily.   azelastine (ASTELIN) 0.1 % nasal spray Place 1 spray into both nostrils 2 (two) times daily. Use in each nostril as directed (Patient not taking: Reported on 05/19/2022)   No facility-administered medications prior to visit.    Review of Systems  Constitutional:  Negative for  chills and fever.  HENT:  Negative for ear pain.   Eyes:  Negative for blurred vision.  Respiratory:  Negative for shortness of breath.   Cardiovascular:  Negative for chest pain.  Gastrointestinal:  Negative for abdominal pain, nausea and vomiting.  Genitourinary:  Negative for dysuria.  Musculoskeletal:  Negative for myalgias.  Neurological:  Negative for dizziness and headaches.       Objective:    BP 128/82   Pulse (!) 101   Ht '5\' 3"'$  (1.6 m)   Wt 206 lb (93.4 kg)   SpO2 93%   BMI 36.49 kg/m  BP Readings from Last 3 Encounters:  05/19/22 128/82  04/02/22 (!) 153/80  03/14/22 (!) 178/84      Physical Exam HENT:     Head: Normocephalic.     Right Ear: Tympanic membrane normal.     Left Ear:  Tympanic membrane normal.     Nose: Nose normal. No congestion or rhinorrhea.     Mouth/Throat:     Mouth: Mucous membranes are moist.  Eyes:     Extraocular Movements: Extraocular movements intact.     Pupils: Pupils are equal, round, and reactive to light.  Cardiovascular:     Rate and Rhythm: Normal rate and regular rhythm.     Pulses: Normal pulses.  Pulmonary:     Effort: Pulmonary effort is normal.  Abdominal:     General: Bowel sounds are normal.     Palpations: Abdomen is soft. There is no mass.     Tenderness: There is no right CVA tenderness, left CVA tenderness or guarding.  Musculoskeletal:        General: Tenderness present. Normal range of motion.     Cervical back: Normal range of motion and neck supple.     Right foot: Normal range of motion.     Left foot: Normal range of motion.  Skin:    General: Skin is warm and dry.     Capillary Refill: Capillary refill takes less than 2 seconds.  Neurological:     General: No focal deficit present.     Mental Status: She is alert and oriented to person, place, and time.  Psychiatric:        Mood and Affect: Mood normal.        Thought Content: Thought content normal.      No results found for any visits on 05/19/22.    Assessment & Plan:    Routine Health Maintenance and Physical Exam  Immunization History  Administered Date(s) Administered   Td 11/06/2017    Health Maintenance  Topic Date Due   PAP SMEAR-Modifier  Never done   COVID-19 Vaccine (1) 06/04/2022 (Originally 03/19/1961)   Zoster Vaccines- Shingrix (1 of 2) 08/17/2022 (Originally 09/16/2008)   MAMMOGRAM  03/19/2024   DTaP/Tdap/Td (2 - Tdap) 11/07/2027   COLONOSCOPY (Pts 45-32yr Insurance coverage will need to be confirmed)  08/21/2030   Hepatitis C Screening  Completed   HIV Screening  Completed   HPV VACCINES  Aged Out   INFLUENZA VACCINE  Discontinued    Discussed health benefits of physical activity, and encouraged her to engage in  regular exercise appropriate for her age and condition.  Primary hypertension -     CBC with Differential/Platelet -     CMP14+EGFR -     Microalbumin / creatinine urine ratio  Hyperlipidemia, unspecified hyperlipidemia type -     Lipid panel  IFG (impaired fasting glucose) -  Hemoglobin A1c  Hypothyroidism, unspecified type -     TSH + free T4  Encounter for routine adult physical exam with abnormal findings Assessment & Plan: Physical exam done, labs ordered  Updated screening and health maintenance  Exercise and nutrition counseling BMI  36.49 Vitals:   05/19/22 0934 05/19/22 0941  BP: (!) 153/86 128/82  Patient taking lisinopril 10 mg, Follow up in 2 weeks with blood pressure logs to evaluate trends and medication management  Discuss to follow a DASH diet which includes vegetables,fruits,whole grains, fat free or low fat diary,fish,poultry,beans,nuts and seeds,vegetable oils. Find an activity that you will enjoy and start to be active at least 5 days a week for 30 minutes each day.  Right foot pain -explained to patient non pharmacological interventions include the use of ice or heat, rest, recommend range of motion exercises, gentle stretching. The use of NSAIDs for pain management.  Follow up for worsening or persistent symptoms. Patient verbalizes understanding regarding plan of care and all questions answered.      Return in about 2 weeks (around 06/02/2022) for re-check blood pressure, hypertension.     Renard Hamper Ria Comment, FNP

## 2022-05-19 NOTE — Assessment & Plan Note (Addendum)
Physical exam done, labs ordered  Updated screening and health maintenance  Exercise and nutrition counseling BMI  36.49 Vitals:   05/19/22 0934 05/19/22 0941  BP: (!) 153/86 128/82  Patient taking lisinopril 10 mg, Follow up in 2 weeks with blood pressure logs to evaluate trends and medication management  Discuss to follow a DASH diet which includes vegetables,fruits,whole grains, fat free or low fat diary,fish,poultry,beans,nuts and seeds,vegetable oils. Find an activity that you will enjoy and start to be active at least 5 days a week for 30 minutes each day.  Right foot pain -explained to patient non pharmacological interventions include the use of ice or heat, rest, recommend range of motion exercises, gentle stretching. The use of NSAIDs for pain management.  Follow up for worsening or persistent symptoms. Patient verbalizes understanding regarding plan of care and all questions answered.

## 2022-05-19 NOTE — Patient Instructions (Signed)
It was pleasure meeting with you today. Please take medications as prescribed. Follow up with your primary health provider if any health concerns arises.

## 2022-05-21 ENCOUNTER — Encounter: Payer: Self-pay | Admitting: Radiology

## 2022-05-21 LAB — CBC WITH DIFFERENTIAL/PLATELET
Basophils Absolute: 0 10*3/uL (ref 0.0–0.2)
Basos: 0 %
EOS (ABSOLUTE): 0 10*3/uL (ref 0.0–0.4)
Eos: 1 %
Hematocrit: 41.5 % (ref 34.0–46.6)
Hemoglobin: 13.6 g/dL (ref 11.1–15.9)
Immature Grans (Abs): 0 10*3/uL (ref 0.0–0.1)
Immature Granulocytes: 0 %
Lymphocytes Absolute: 1.2 10*3/uL (ref 0.7–3.1)
Lymphs: 23 %
MCH: 29.3 pg (ref 26.6–33.0)
MCHC: 32.8 g/dL (ref 31.5–35.7)
MCV: 89 fL (ref 79–97)
Monocytes Absolute: 0.6 10*3/uL (ref 0.1–0.9)
Monocytes: 13 %
Neutrophils Absolute: 3.1 10*3/uL (ref 1.4–7.0)
Neutrophils: 63 %
Platelets: 224 10*3/uL (ref 150–450)
RBC: 4.64 x10E6/uL (ref 3.77–5.28)
RDW: 12.3 % (ref 11.7–15.4)
WBC: 5 10*3/uL (ref 3.4–10.8)

## 2022-05-21 LAB — CMP14+EGFR
ALT: 15 IU/L (ref 0–32)
AST: 22 IU/L (ref 0–40)
Albumin/Globulin Ratio: 1.4 (ref 1.2–2.2)
Albumin: 4.1 g/dL (ref 3.9–4.9)
Alkaline Phosphatase: 89 IU/L (ref 44–121)
BUN/Creatinine Ratio: 19 (ref 12–28)
BUN: 16 mg/dL (ref 8–27)
Bilirubin Total: 0.8 mg/dL (ref 0.0–1.2)
CO2: 20 mmol/L (ref 20–29)
Calcium: 9.6 mg/dL (ref 8.7–10.3)
Chloride: 98 mmol/L (ref 96–106)
Creatinine, Ser: 0.85 mg/dL (ref 0.57–1.00)
Globulin, Total: 2.9 g/dL (ref 1.5–4.5)
Glucose: 248 mg/dL — ABNORMAL HIGH (ref 70–99)
Potassium: 4.2 mmol/L (ref 3.5–5.2)
Sodium: 135 mmol/L (ref 134–144)
Total Protein: 7 g/dL (ref 6.0–8.5)
eGFR: 77 mL/min/{1.73_m2} (ref >59)

## 2022-05-21 LAB — TSH+FREE T4
Free T4: 1.21 ng/dL (ref 0.82–1.77)
TSH: 0.646 u[IU]/mL (ref 0.450–4.500)

## 2022-05-21 LAB — MICROALBUMIN / CREATININE URINE RATIO
Creatinine, Urine: 380.8 mg/dL
Microalb/Creat Ratio: 79 mg/g creat — ABNORMAL HIGH (ref 0–29)
Microalbumin, Urine: 300.9 ug/mL

## 2022-05-21 LAB — LIPID PANEL
Chol/HDL Ratio: 5.1 ratio — ABNORMAL HIGH (ref 0.0–4.4)
Cholesterol, Total: 148 mg/dL (ref 100–199)
HDL: 29 mg/dL — ABNORMAL LOW (ref >39)
LDL Chol Calc (NIH): 86 mg/dL (ref 0–99)
Triglycerides: 190 mg/dL — ABNORMAL HIGH (ref 0–149)
VLDL Cholesterol Cal: 33 mg/dL (ref 5–40)

## 2022-05-21 LAB — HEMOGLOBIN A1C
Est. average glucose Bld gHb Est-mCnc: 232 mg/dL
Hgb A1c MFr Bld: 9.7 % — ABNORMAL HIGH (ref 4.8–5.6)

## 2022-05-27 ENCOUNTER — Other Ambulatory Visit: Payer: Self-pay | Admitting: Family Medicine

## 2022-05-27 MED ORDER — ROSUVASTATIN CALCIUM 5 MG PO TABS: 5.0000 mg | ORAL_TABLET | Freq: Every day | ORAL | 3 refills | Status: DC

## 2022-05-27 MED ORDER — LANCET DEVICE MISC: 1.0000 | Freq: Three times a day (TID) | 0 refills | Status: AC

## 2022-05-27 MED ORDER — BLOOD GLUCOSE TEST VI STRP: 1.0000 | ORAL_STRIP | Freq: Three times a day (TID) | 3 refills | Status: DC

## 2022-05-27 MED ORDER — LANCETS MISC. MISC: 1.0000 | Freq: Three times a day (TID) | 0 refills | Status: AC

## 2022-05-27 MED ORDER — EMPAGLIFLOZIN 10 MG PO TABS: 10.0000 mg | ORAL_TABLET | Freq: Every day | ORAL | 3 refills | Status: DC

## 2022-05-27 MED ORDER — BLOOD GLUCOSE MONITORING SUPPL DEVI: 1.0000 | Freq: Three times a day (TID) | 0 refills | Status: DC

## 2022-05-27 MED ORDER — FREESTYLE LANCETS MISC: 12 refills | Status: DC

## 2022-05-27 MED ORDER — TRULICITY 0.75 MG/0.5ML ~~LOC~~ SOAJ: 0.7500 mg | SUBCUTANEOUS | 2 refills | Status: DC

## 2022-06-01 ENCOUNTER — Telehealth: Payer: Self-pay

## 2022-06-01 NOTE — Telephone Encounter (Signed)
Patient left voicemail message on 05/30/22 wanting to set up an appointment for an injection. I returned her call this morning and left message for her to call me to get her scheduled.

## 2022-06-02 ENCOUNTER — Ambulatory Visit: Payer: Medicaid Other | Admitting: Orthopedic Surgery

## 2022-06-02 ENCOUNTER — Encounter: Payer: Self-pay | Admitting: Orthopedic Surgery

## 2022-06-02 ENCOUNTER — Ambulatory Visit: Payer: Medicaid Other | Admitting: Family Medicine

## 2022-06-02 ENCOUNTER — Encounter: Payer: Self-pay | Admitting: Family Medicine

## 2022-06-02 VITALS — BP 148/78 | HR 90 | Ht 64.0 in | Wt 211.1 lb

## 2022-06-02 DIAGNOSIS — M19012 Primary osteoarthritis, left shoulder: Secondary | ICD-10-CM | POA: Diagnosis not present

## 2022-06-02 DIAGNOSIS — E119 Type 2 diabetes mellitus without complications: Secondary | ICD-10-CM | POA: Diagnosis not present

## 2022-06-02 DIAGNOSIS — I10 Essential (primary) hypertension: Secondary | ICD-10-CM | POA: Diagnosis not present

## 2022-06-02 DIAGNOSIS — R202 Paresthesia of skin: Secondary | ICD-10-CM

## 2022-06-02 MED ORDER — FREESTYLE LANCETS MISC: 12 refills | Status: DC

## 2022-06-02 MED ORDER — BLOOD GLUCOSE TEST VI STRP: 1.0000 | ORAL_STRIP | Freq: Three times a day (TID) | 3 refills | Status: AC

## 2022-06-02 MED ORDER — BLOOD GLUCOSE MONITORING SUPPL DEVI: 1.0000 | Freq: Three times a day (TID) | 0 refills | Status: DC

## 2022-06-02 MED ORDER — GABAPENTIN 100 MG PO CAPS: 100.0000 mg | ORAL_CAPSULE | Freq: Three times a day (TID) | ORAL | 0 refills | Status: DC

## 2022-06-02 MED ORDER — EMPAGLIFLOZIN 10 MG PO TABS: 10.0000 mg | ORAL_TABLET | Freq: Every day | ORAL | 3 refills | Status: DC

## 2022-06-02 MED ORDER — OLMESARTAN MEDOXOMIL-HCTZ 20-12.5 MG PO TABS: 1.0000 | ORAL_TABLET | Freq: Every day | ORAL | 3 refills | Status: DC

## 2022-06-02 MED ORDER — TRULICITY 0.75 MG/0.5ML ~~LOC~~ SOAJ: 0.7500 mg | SUBCUTANEOUS | 2 refills | Status: DC

## 2022-06-02 NOTE — Progress Notes (Signed)
Patient Office Visit   Subjective   Patient ID: Megan Salazar, female    DOB: February 06, 1961  Age: 62 y.o. MRN: [id number]  CC:  Chief Complaint  Patient presents with   Follow-up    2 week f/u for htn.     HPI Megan Salazar 62 year old female, presents to clinic for hypertension follow up. She  has a past medical history of Asthma, Bone spur, Carpal tunnel syndrome, Complication of anesthesia, Diabetes ([place]), HTN (hypertension), IBS (irritable bowel syndrome), Paresthesias, Pneumonia (03/2020), and Sciatica.  Hypertension: Patient here for follow-up of elevated blood pressure. She is exercising and is adherent to low salt diet.  Blood pressure is not well controlled at home. Patient denies cardiac symptoms chest pain, chest pressure/discomfort, dyspnea, lower extremity edema, near-syncope, palpitations, syncope, and tachypnea.Cardiovascular risk factors: diabetes mellitus, dyslipidemia, hypertension, obesity (BMI >= 30 kg/m2), and sedentary lifestyle.    Outpatient Encounter Medications as of 06/02/2022  Medication Sig   albuterol (PROVENTIL) (2.5 MG/3ML) 0.083% nebulizer solution Take 3 mLs (2.5 mg total) by nebulization every 6 (six) hours as needed for wheezing or shortness of breath.   albuterol (VENTOLIN HFA) 108 (90 Base) MCG/ACT inhaler Inhale 2 puffs into the lungs every 6 (six) hours as needed for wheezing or shortness of breath.   Artificial Tear Ointment (DRY EYES OP) Place 1 drop into both eyes daily as needed (dry eyes).   azelastine (ASTELIN) 0.1 % nasal spray Place 1 spray into both nostrils 2 (two) times daily. Use in each nostril as directed   CALCIUM-MAGNESIUM-VITAMIN D PO Take 1 tablet by mouth daily.   Charcoal Activated 260 MG CAPS Take 260 mg by mouth See admin instructions. Every 10 days   CINNAMON PO Take 1,000 mg by mouth daily.   Coenzyme Q10 (COQ10) 200 MG CAPS Take 200 mg by mouth daily.   Cyanocobalamin (VITAMIN B-12 PO) Take 500 mcg by mouth  daily.   cyclobenzaprine (FLEXERIL) 5 MG tablet TAKE (1) TABLET BY MOUTH TWICE DAILY AS NEEDED.   Flaxseed, Linseed, (FLAXSEED OIL PO) Take 1,000 mg by mouth daily.   fluticasone (FLOVENT HFA) 110 MCG/ACT inhaler Inhale 1 puff into the lungs 2 (two) times daily as needed (shortness of breath).   Lancet Device MISC 1 each by Does not apply route in the morning, at noon, and at bedtime. May substitute to any manufacturer covered by patient's insurance.   Lancets Misc. MISC 1 each by Does not apply route in the morning, at noon, and at bedtime. May substitute to any manufacturer covered by patient's insurance.   Menthol-Methyl Salicylate (MUSCLE RUB) 10-15 % CREA Apply 1 application topically as needed for muscle pain.   Misc Natural Products (TART CHERRY ADVANCED PO) Take 1 capsule by mouth daily.   montelukast (SINGULAIR) 10 MG tablet Take 10 mg by mouth at bedtime.   naproxen (NAPROSYN) 375 MG tablet Take 1 tablet (375 mg total) by mouth 2 (two) times daily as needed. Take with food to prevent GI upset   olmesartan-hydrochlorothiazide (BENICAR HCT) 20-12.5 MG tablet Take 1 tablet by mouth daily.   OVER THE COUNTER MEDICATION Take 1 capsule by mouth every evening. Doterra deep blue   Probiotic Product (PROBIOTIC DAILY PO) Take 1 each by mouth daily. Gummy   rosuvastatin (CRESTOR) 5 MG tablet Take 1 tablet (5 mg total) by mouth daily.   TURMERIC PO Take 1,000 mg by mouth daily.   [DISCONTINUED] Blood Glucose Monitoring Suppl DEVI 1 each by Does  not apply route in the morning, at noon, and at bedtime. May substitute to any manufacturer covered by patient's insurance.   [DISCONTINUED] Dulaglutide (TRULICITY) [id number] [id number] SOPN Inject 0.75 mg into the skin once a week.   [DISCONTINUED] empagliflozin (JARDIANCE) 10 MG TABS tablet Take 1 tablet (10 mg total) by mouth daily before breakfast.   [DISCONTINUED] Glucose Blood (BLOOD GLUCOSE TEST STRIPS) STRP 1 each by In Vitro route in the morning, at noon,  and at bedtime. May substitute to any manufacturer covered by patient's insurance.   [DISCONTINUED] Lancets (FREESTYLE) lancets Use as instructed   [DISCONTINUED] lisinopril (ZESTRIL) 10 MG tablet Take 5 mg by mouth in the morning and at bedtime.   Blood Glucose Monitoring Suppl DEVI 1 each by Does not apply route in the morning, at noon, and at bedtime. May substitute to any manufacturer covered by patient's insurance.   Dulaglutide (TRULICITY) [id number] [id number] SOPN Inject 0.75 mg into the skin once a week.   empagliflozin (JARDIANCE) 10 MG TABS tablet Take 1 tablet (10 mg total) by mouth daily before breakfast.   Glucose Blood (BLOOD GLUCOSE TEST STRIPS) STRP 1 each by In Vitro route in the morning, at noon, and at bedtime. May substitute to any manufacturer covered by patient's insurance.   Lancets (FREESTYLE) lancets Use as instructed   No facility-administered encounter medications on file as of 06/02/2022.    Past Surgical History:  Procedure Laterality Date   ABDOMINAL HYSTERECTOMY     partial   CARPAL TUNNEL RELEASE     [place], [place]   CESAREAN SECTION     COLONOSCOPY  07/18/2010   Fields-SIMPLE ADENOMA (Next colonoscopy  06/2020)   COLONOSCOPY WITH PROPOFOL N/A 08/20/2020   Procedure: COLONOSCOPY WITH PROPOFOL;  Surgeon: Eloise Harman, DO;  Location: [facility];  Service: Endoscopy;  Laterality: N/A;  9:00am    Review of Systems  Constitutional:  Negative for chills and fever.  Respiratory:  Negative for shortness of breath.   Cardiovascular:  Negative for chest pain and palpitations.  Gastrointestinal:  Negative for abdominal pain, nausea and vomiting.  Genitourinary:  Negative for dysuria.  Musculoskeletal:  Negative for myalgias.  Neurological:  Negative for dizziness and headaches.      Objective    BP (!) 148/78 (BP Location: Left Arm)   Pulse 90   Ht '5\' 4"'$  (1.626 m)   Wt 211 lb 1.9 oz (95.8 kg)   SpO2 95%   BMI 36.24 kg/m   Physical Exam Vitals reviewed.   Cardiovascular:     Rate and Rhythm: Normal rate and regular rhythm.     Pulses: Normal pulses.     Heart sounds: Normal heart sounds.  Pulmonary:     Effort: Pulmonary effort is normal.     Breath sounds: Normal breath sounds.  Musculoskeletal:        General: Normal range of motion.     Cervical back: Normal range of motion.  Skin:    General: Skin is warm and dry.     Capillary Refill: Capillary refill takes less than 2 seconds.  Neurological:     General: No focal deficit present.     Mental Status: She is alert.  Psychiatric:        Mood and Affect: Mood normal.       Assessment & Plan:  Type 2 diabetes mellitus without complication, without long-term current use of insulin (HCC) -     Trulicity; Inject 0.75 mg into the skin once a  week.  Dispense: 6 mL; Refill: 2 -     Empagliflozin; Take 1 tablet (10 mg total) by mouth daily before breakfast.  Dispense: 30 tablet; Refill: 3 -     Blood Glucose Monitoring Suppl; 1 each by Does not apply route in the morning, at noon, and at bedtime. May substitute to any manufacturer covered by patient's insurance.  Dispense: 1 each; Refill: 0 -     Blood Glucose Test; 1 each by In Vitro route in the morning, at noon, and at bedtime. May substitute to any manufacturer covered by patient's insurance.  Dispense: 90 each; Refill: 3 -     FreeStyle Lancets; Use as instructed  Dispense: 100 each; Refill: 12  Primary hypertension Assessment & Plan: Vitals:   06/02/22 1010 06/02/22 1011  BP: (!) 170/88 (!) 148/78   D/C Lisinopril 10 mg patient reports does not like how it make her feel. Blood pressure not controlled. Prescribed olmesartan-HCTZ 20-12.5 mg       Explained non pharmacological interventions such as low salt, DASH diet discussed. Educated on stress reduction and physical activity. Discussed signs and symptoms of major cardiovascular event and need to present to the ED. Follow up in 4 weeks or sooner if needed. Patient verbalizes  understanding regarding plan of care and all questions answered.   Orders: -     Olmesartan Medoxomil-HCTZ; Take 1 tablet by mouth daily.  Dispense: 30 tablet; Refill: 3    Return in about 4 weeks (around 06/30/2022) for hypertension, re-check blood pressure.   Renard Hamper Ria Comment, FNP

## 2022-06-02 NOTE — Assessment & Plan Note (Addendum)
Vitals:   06/02/22 1010 06/02/22 1011  BP: (!) 170/88 (!) 148/78   D/C Lisinopril 10 mg patient reports does not like how it make her feel. Blood pressure not controlled. Prescribed olmesartan-HCTZ 20-12.5 mg       Explained non pharmacological interventions such as low salt, DASH diet discussed. Educated on stress reduction and physical activity. Discussed signs and symptoms of major cardiovascular event and need to present to the ED. Follow up in 4 weeks or sooner if needed. Patient verbalizes understanding regarding plan of care and all questions answered.

## 2022-06-02 NOTE — Patient Instructions (Signed)
It was pleasure meeting with you today. Please take medications as prescribed. Follow up with your primary health provider if any health concerns arises. If symptoms worsen please contact your primary care provider and/or visit the emergency department.  

## 2022-06-02 NOTE — Patient Instructions (Signed)
[facility]  Office address is [address] [place] The phone number is [phone]  The office will call you with an appointment Dr. Newton will do the nerve study  

## 2022-06-03 NOTE — Progress Notes (Signed)
Return Patient Visit  Assessment: Megan Salazar is a 62 y.o. female with the following: 1. Glenohumeral arthritis, left 2.  Paresthesias of left hand; history of left carpal tunnel syndrome and open release   Plan: Megan Salazar has severe left glenohumeral joint arthritis.  Most recent injection was approximately 6 weeks ago.  It is too early to repeat an injection.  I have offered her some gabapentin in the interim.  She can return in 6 weeks for repeat steroid injection.  We did briefly discuss shoulder replacement, including the recovery.  All questions were answered.  In addition, she continues to have numbness and tingling in the left hand.  She does have a history of open carpal tunnel release, completed almost 40 years ago.  She had excellent results.  The numbness and tingling is progressively worsening.  At this point, I am recommending EMG results for further evaluation of the left upper extremity.  Once results are available, she will return to clinic.  Otherwise, I can see her approximately 6 weeks for another injection of the left shoulder.   Follow-up: Return in about 6 weeks (around 07/14/2022).  Subjective:  Chief Complaint  Patient presents with   Shoulder Pain    Left having pain and numbness     History of Present Illness: Megan Salazar is a 62 y.o. female who returns to clinic today for repeat evaluation of her left shoulder pain.  She has known left glenohumeral joint arthritis.  Most recent injection was approximately 6 weeks ago.  She states improvement in her symptoms for a couple of weeks, with gradual return to previous level of dysfunction and pain.  She was hopeful to get another injection today.  This is limiting her function.  She is also reporting progressively worsening numbness and tingling of the left hand.  She does have a history of left carpal tunnel release, completed almost 40 years ago.  No recent injuries.   Review of  Systems: No fevers or chills No numbness or tingling No chest pain No shortness of breath No bowel or bladder dysfunction No GI distress No headaches   Objective: There were no vitals taken for this visit.  Physical Exam:  General: Alert and oriented. and No acute distress. Gait: Normal gait.  Left hand with a healed volar base surgical incision.  No surrounding erythema or drainage.  Decree sensation in the median nerve distribution.  Positive Tinel's.  Positive Phalen's.  severely limited range of motion in her left shoulder.  Difficulty getting her arm to 90 degrees of forward flexion.  Difficulty getting her arm to her lower back.  There is crepitus with range of motion testing.  IMAGING: I personally ordered and reviewed the following images  No new imaging obtained today  New Medications:  Meds ordered this encounter  Medications   gabapentin (NEURONTIN) 100 MG capsule    Sig: Take 1 capsule (100 mg total) by mouth 3 (three) times daily.    Dispense:  90 capsule    Refill:  0      Mordecai Rasmussen, MD  06/03/2022 1:10 PM

## 2022-06-08 ENCOUNTER — Telehealth: Payer: Self-pay | Admitting: Physical Medicine and Rehabilitation

## 2022-06-08 NOTE — Telephone Encounter (Signed)
Patient needs appointment for [facility] she will be a New Patient and does not have cell phone. She will be home until 1pm today. But tomorrow will be available till 3pm. Patient states she is able to come in for any time and date. Just a few days notice but message can be left on answering machine. Please speak slowly when leaving a message

## 2022-06-09 ENCOUNTER — Telehealth: Payer: Self-pay | Admitting: Family Medicine

## 2022-06-09 ENCOUNTER — Other Ambulatory Visit: Payer: Self-pay

## 2022-06-09 MED ORDER — MONTELUKAST SODIUM 10 MG PO TABS: 10.0000 mg | ORAL_TABLET | Freq: Every day | ORAL | 0 refills | Status: DC

## 2022-06-09 NOTE — Telephone Encounter (Signed)
Patient called left a voicemail needs refill only has 9 days left  montelukast (SINGULAIR) 10 MG tablet [id number]  Pharmacy [facility]

## 2022-06-09 NOTE — Telephone Encounter (Signed)
Refills sent to pharmacy. 

## 2022-06-09 NOTE — Telephone Encounter (Signed)
Spoke with patient and scheduled NCV for 06/23/22.

## 2022-06-19 ENCOUNTER — Ambulatory Visit: Payer: Medicaid Other | Admitting: Family Medicine

## 2022-06-19 ENCOUNTER — Encounter: Payer: Self-pay | Admitting: Family Medicine

## 2022-06-19 VITALS — BP 123/74 | HR 85 | Ht 64.0 in | Wt 201.0 lb

## 2022-06-19 DIAGNOSIS — E119 Type 2 diabetes mellitus without complications: Secondary | ICD-10-CM | POA: Diagnosis not present

## 2022-06-19 MED ORDER — MONTELUKAST SODIUM 10 MG PO TABS: 10.0000 mg | ORAL_TABLET | Freq: Every day | ORAL | 1 refills | Status: DC

## 2022-06-19 NOTE — Patient Instructions (Signed)
It was pleasure meeting with you today. Please take medications as prescribed. Follow up with your primary health provider if any health concerns arises. If symptoms worsen please contact your primary care provider and/or visit the emergency department.  

## 2022-06-19 NOTE — Assessment & Plan Note (Addendum)
At home blood sugars, fasting glucose lowest has been 23 After breakfast glucose ranges about 109-138 after lunch 170-217. Patient Currently on Trulicity [id number] mg injection once a week. Patient reported stop taking Jardiance 10 mg And does not want another alternative. Discussed we want hemoglobin A1C at 7 at goal on our next visit  Discussed medication desired effects, potential side effects, and how to administer the medication. Nonpharmacological interventions such as low carb diet,high in vegetables and fruit discussed. Educated on importance of physical activity. Discussed signs and symptoms of hypoglycemia and need to present to the ED. Follow up in 3 months or sooner if needed. Patient verbalizes understanding regarding plan of care and all questions answered.

## 2022-06-19 NOTE — Progress Notes (Signed)
Patient Office Visit   Subjective   Patient ID: Megan Salazar, female    DOB: 26-Aug-1960  Age: 62 y.o. MRN: [id number]  CC:  Chief Complaint  Patient presents with   Shaking    Patient complains of feeling shaky after taking jardiance and trulicity.     HPI Megan Salazar 62 year old female presents to the clinic for diabetes medication side effects She  has a past medical history of Asthma, Bone spur, Carpal tunnel syndrome, Complication of anesthesia, Diabetes (Time), HTN (hypertension), IBS (irritable bowel syndrome), Paresthesias, Pneumonia (03/2020), and Sciatica. Refer to plan and assessment for details of today's visit   HPI    Outpatient Encounter Medications as of 06/19/2022  Medication Sig   albuterol (PROVENTIL) (2.5 MG/3ML) 0.083% nebulizer solution Take 3 mLs (2.5 mg total) by nebulization every 6 (six) hours as needed for wheezing or shortness of breath.   albuterol (VENTOLIN HFA) 108 (90 Base) MCG/ACT inhaler Inhale 2 puffs into the lungs every 6 (six) hours as needed for wheezing or shortness of breath.   Artificial Tear Ointment (DRY EYES OP) Place 1 drop into both eyes daily as needed (dry eyes).   azelastine (ASTELIN) 0.1 % nasal spray Place 1 spray into both nostrils 2 (two) times daily. Use in each nostril as directed   Blood Glucose Monitoring Suppl DEVI 1 each by Does not apply route in the morning, at noon, and at bedtime. May substitute to any manufacturer covered by patient's insurance.   CALCIUM-MAGNESIUM-VITAMIN D PO Take 1 tablet by mouth daily.   Charcoal Activated 260 MG CAPS Take 260 mg by mouth See admin instructions. Every 10 days   CINNAMON PO Take 1,000 mg by mouth daily.   Coenzyme Q10 (COQ10) 200 MG CAPS Take 200 mg by mouth daily.   Cyanocobalamin (VITAMIN B-12 PO) Take 500 mcg by mouth daily.   cyclobenzaprine (FLEXERIL) 5 MG tablet TAKE (1) TABLET BY MOUTH TWICE DAILY AS NEEDED.   Dulaglutide (TRULICITY) [id number] 0000000 SOPN Inject  0.75 mg into the skin once a week.   empagliflozin (JARDIANCE) 10 MG TABS tablet Take 1 tablet (10 mg total) by mouth daily before breakfast.   Flaxseed, Linseed, (FLAXSEED OIL PO) Take 1,000 mg by mouth daily.   fluticasone (FLOVENT HFA) 110 MCG/ACT inhaler Inhale 1 puff into the lungs 2 (two) times daily as needed (shortness of breath).   gabapentin (NEURONTIN) 100 MG capsule Take 1 capsule (100 mg total) by mouth 3 (three) times daily.   Glucose Blood (BLOOD GLUCOSE TEST STRIPS) STRP 1 each by In Vitro route in the morning, at noon, and at bedtime. May substitute to any manufacturer covered by patient's insurance.   Lancet Device MISC 1 each by Does not apply route in the morning, at noon, and at bedtime. May substitute to any manufacturer covered by patient's insurance.   Lancets (FREESTYLE) lancets Use as instructed   Lancets Misc. MISC 1 each by Does not apply route in the morning, at noon, and at bedtime. May substitute to any manufacturer covered by patient's insurance.   Menthol-Methyl Salicylate (MUSCLE RUB) 10-15 % CREA Apply 1 application topically as needed for muscle pain.   Misc Natural Products (TART CHERRY ADVANCED PO) Take 1 capsule by mouth daily.   naproxen (NAPROSYN) 375 MG tablet Take 1 tablet (375 mg total) by mouth 2 (two) times daily as needed. Take with food to prevent GI upset   olmesartan-hydrochlorothiazide (BENICAR HCT) 20-12.5 MG tablet Take 1 tablet  by mouth daily.   OVER THE COUNTER MEDICATION Take 1 capsule by mouth every evening. Doterra deep blue   Probiotic Product (PROBIOTIC DAILY PO) Take 1 each by mouth daily. Gummy   rosuvastatin (CRESTOR) 5 MG tablet Take 1 tablet (5 mg total) by mouth daily.   TURMERIC PO Take 1,000 mg by mouth daily.   [DISCONTINUED] montelukast (SINGULAIR) 10 MG tablet Take 1 tablet (10 mg total) by mouth at bedtime.   montelukast (SINGULAIR) 10 MG tablet Take 1 tablet (10 mg total) by mouth at bedtime.   No facility-administered  encounter medications on file as of 06/19/2022.    Past Surgical History:  Procedure Laterality Date   ABDOMINAL HYSTERECTOMY     partial   CARPAL TUNNEL RELEASE     bilat, [place]   CESAREAN SECTION     COLONOSCOPY  07/18/2010   Fields-SIMPLE ADENOMA (Next colonoscopy  06/2020)   COLONOSCOPY WITH PROPOFOL N/A 08/20/2020   Procedure: COLONOSCOPY WITH PROPOFOL;  Surgeon: Eloise Harman, DO;  Location: [facility];  Service: Endoscopy;  Laterality: N/A;  9:00am    Review of Systems  Constitutional:  Negative for chills and fever.  Eyes:  Negative for blurred vision.  Respiratory:  Negative for shortness of breath.   Cardiovascular:  Negative for chest pain.  Gastrointestinal:  Negative for abdominal pain, nausea and vomiting.  Genitourinary:  Negative for dysuria.  Musculoskeletal:  Negative for myalgias.  Neurological:  Positive for tremors. Negative for dizziness and headaches.      Objective    BP 123/74   Pulse 85   Ht 5\' 4"  (1.626 m)   Wt 201 lb (91.2 kg)   SpO2 95%   BMI 34.50 kg/m   Physical Exam Cardiovascular:     Rate and Rhythm: Normal rate and regular rhythm.     Pulses: Normal pulses.     Heart sounds: Normal heart sounds.  Pulmonary:     Effort: Pulmonary effort is normal.     Breath sounds: Normal breath sounds.  Musculoskeletal:        General: Normal range of motion.  Skin:    General: Skin is warm and dry.     Capillary Refill: Capillary refill takes less than 2 seconds.  Neurological:     General: No focal deficit present.     Mental Status: She is alert.     Gait: Gait normal.     Deep Tendon Reflexes: Reflexes normal.  Psychiatric:        Mood and Affect: Mood normal.       Assessment & Plan:  Type 2 diabetes mellitus without complication, without long-term current use of insulin (HCC) Assessment & Plan: At home blood sugars, fasting glucose lowest has been 23 After breakfast glucose ranges about 109-138 after lunch  170-217. Patient Currently on Trulicity [id number] mg injection once a week. Patient reported stop taking Jardiance 10 mg And does not want another alternative. Discussed we want hemoglobin A1C at 7 at goal on our next visit  Discussed medication desired effects, potential side effects, and how to administer the medication. Nonpharmacological interventions such as low carb diet,high in vegetables and fruit discussed. Educated on importance of physical activity. Discussed signs and symptoms of hypoglycemia and need to present to the ED. Follow up in 3 months or sooner if needed. Patient verbalizes understanding regarding plan of care and all questions answered.   Other orders -     Montelukast Sodium; Take 1 tablet (10 mg total)  by mouth at bedtime.  Dispense: 90 tablet; Refill: 1    Return in about 3 months (around 09/19/2022) for hypertension, diabetes follow up .   Renard Hamper Ria Comment, FNP

## 2022-06-22 ENCOUNTER — Telehealth: Payer: Self-pay | Admitting: Physical Medicine and Rehabilitation

## 2022-06-22 NOTE — Telephone Encounter (Signed)
Patient called and confirmed appointment

## 2022-06-23 ENCOUNTER — Ambulatory Visit: Payer: Medicaid Other | Admitting: Physical Medicine and Rehabilitation

## 2022-06-23 DIAGNOSIS — Z9889 Other specified postprocedural states: Secondary | ICD-10-CM

## 2022-06-23 DIAGNOSIS — E119 Type 2 diabetes mellitus without complications: Secondary | ICD-10-CM | POA: Diagnosis not present

## 2022-06-23 DIAGNOSIS — R202 Paresthesia of skin: Secondary | ICD-10-CM

## 2022-06-23 DIAGNOSIS — M542 Cervicalgia: Secondary | ICD-10-CM

## 2022-06-23 NOTE — Progress Notes (Signed)
Functional Pain Scale - descriptive words and definitions  Mild (2)   Noticeable when not distracted/no impact on ADL's/sleep only slightly affected and able to   use both passive and active distraction for comfort. Mild range order  Average Pain 2   Mainly in left wrist and goes up the arm into shoulder. Right handed but does use left hand a lot.  Bilateral Carpal Tunnel surgery in 1988.  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 Sometimes hard to grasp, Numbness in thumb and pointer finger.

## 2022-06-24 ENCOUNTER — Telehealth: Payer: Self-pay | Admitting: Family Medicine

## 2022-06-24 NOTE — Telephone Encounter (Signed)
Spoke with patient. Script was refilled on 3/29. Pharmacy states it is an insurance thing and they will not pay for a refill until 4/26. Advised patient of GoodRx and paying out of pocket if need be.

## 2022-06-24 NOTE — Telephone Encounter (Signed)
Pt called in needs refill on montelukast (SINGULAIR) 10 MG tablet   Has only one pill left. Pharm states they cannot refill before  4/26. Patient needs med. Wants a call back.

## 2022-06-29 ENCOUNTER — Encounter: Payer: Self-pay | Admitting: Physical Medicine and Rehabilitation

## 2022-06-29 NOTE — Progress Notes (Signed)
Megan Salazar - 62 y.o. female MRN [id number]  Date of birth: 10/26/1960  Office Visit Note: Visit Date: 06/23/2022 PCP: Rica Records, FNP Referred by: Oliver Barre, MD  Subjective: Chief Complaint  Patient presents with   Left Hand - Numbness   Left Arm - Numbness, Pain, Weakness   HPI: Megan Salazar is a 62 y.o. female who comes in today at the request of Dr. Thane Edu for evaluation and management of chronic, worsening and severe pain, numbness and tingling in the Left upper extremities.  Patient is Right hand dominant.  She reports several months now of worsening pain numbness and tingling of the left arm and hand.  She designates this to the thumb and index finger on the left.  No symptoms on the right.  She does get symptoms that seem to go up from the hand to the shoulder and not down into the hand.  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.  She reports a history of prior carpal tunnel surgery in 1988 that was done while she was in the [employer].  She has some chronic neck pain with more of a history of lumbar issues.  No cervical imaging noted.  Her case is complicated by type 2 diabetes with last hemoglobin A1c of 9.7.  She does not endorse focal weakness but does feel weak in the left hand with trying to grasp objects.  She uses her left hand quite a bit.      Review of Systems  Musculoskeletal:  Positive for back pain, joint pain and neck pain.  Neurological:  Positive for tingling and weakness.  All other systems reviewed and are negative.  Otherwise per HPI.  Assessment & Plan: Visit Diagnoses:    ICD-10-CM   1. Paresthesia of skin  R20.2 NCV with EMG (electromyography)    2. History of bilateral carpal tunnel release  Z98.890     3. Cervicalgia  M54.2     4. 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  Symptoms referring up the arm but we do see that at times with the radiculopathy.  She clearly has elevated A1c but this does not clinically seem like a polyneuropathy although it could be a mononeuropathy from the diabetes.  Electrodiagnostic study performed.  Essentially NORMAL electrodiagnostic study of the left upper limb.  There is no significant electrodiagnostic evidence of nerve entrapment, brachial plexopathy or cervical radiculopathy.  **As you know, purely sensory or demyelinating radiculopathies and chemical radiculitis may not be detected with this particular electrodiagnostic study.  Borderline slowing at the elbow is distal to the ulnar groove and likely technical artifact.  Recommendations: 1.  Follow-up with referring physician. 2.  Continue current management of symptoms.  Consider cervical MRI if felt clinically necessary.  Meds & Orders: No orders of the defined types were placed in this encounter.   Orders Placed This Encounter  Procedures   NCV with EMG (electromyography)    Follow-up: Return for Thane Edu, MD.   Procedures: No procedures performed  EMG & NCV Findings: Evaluation of the left ulnar motor nerve showed borderline decreased conduction velocity (B Elbow-Wrist, 51 m/s).  All remaining nerves (as indicated in the following tables) were within normal limits.  All examined muscles (as indicated in the following table) showed no evidence of electrical instability.    Impression: Essentially NORMAL electrodiagnostic study of the left upper limb.  There is no significant electrodiagnostic evidence of nerve entrapment, brachial plexopathy or cervical radiculopathy.  **As you know, purely sensory or demyelinating radiculopathies and chemical radiculitis may not be detected with this particular electrodiagnostic study.  Borderline slowing at the  elbow is distal to the ulnar groove and likely technical artifact.  Recommendations: 1.  Follow-up with referring physician. 2.  Continue current management of symptoms.  Consider cervical MRI if felt clinically necessary.  ___________________________ Naaman Plummer FAAPMR Board Certified, American Board of Physical Medicine and Rehabilitation    Nerve Conduction Studies Anti Sensory Summary Table   Stim Site NR Peak (ms) Norm Peak (ms) P-T Amp (V) Norm P-T Amp Site1 Site2 Delta-P (ms) Dist (cm) Vel (m/s) Norm Vel (m/s)  Left Median Acr Palm Anti Sensory (2nd Digit)  30.8C  Wrist    3.3 <3.6 27.1 >10 Wrist Palm 1.4 0.0    Palm    1.9 <2.0 50.7         Left Radial Anti Sensory (Base 1st Digit)  31C  Wrist    2.2 <3.1 24.7  Wrist Base 1st Digit 2.2 0.0    Left Ulnar Anti Sensory (5th Digit)  31.3C  Wrist    3.5 <3.7 35.5 >15.0 Wrist 5th Digit 3.5 14.0 40 >38   Motor Summary Table   Stim Site NR Onset (ms) Norm Onset (ms) O-P Amp (mV) Norm O-P Amp Site1 Site2 Delta-0 (ms) Dist (cm) Vel (m/s) Norm Vel (m/s)  Left Median Motor (Abd Poll Brev)  31.1C  Wrist    3.4 <4.2 5.9 >5 Elbow Wrist 3.7 21.0 57 >50  Elbow    7.1  5.7         Left Ulnar Motor (Abd Dig Min)  31.3C  Wrist    3.6 <4.2 9.1 >3 B Elbow Wrist 3.7 19.0 *51 >53  B Elbow    7.3  8.1  A Elbow B Elbow 1.9 10.0 53 >53  A Elbow    9.2  8.8          EMG   Side Muscle Nerve Root Ins Act Fibs Psw Amp Dur Poly Recrt Int Dennie Bible Comment  Left Abd Poll Brev Median C8-T1 Nml Nml Nml Nml Nml 0 Nml Nml   Left 1stDorInt Ulnar C8-T1 Nml Nml Nml Nml Nml 0 Nml Nml   Left PronatorTeres Median C6-7 Nml Nml Nml Nml Nml 0 Nml Nml   Left Biceps Musculocut C5-6 Nml Nml Nml Nml Nml 0 Nml Nml   Left Deltoid Axillary C5-6 Nml Nml Nml Nml Nml 0 Nml Nml     Nerve Conduction Studies Anti Sensory Left/Right Comparison   Stim Site L Lat (ms) R Lat (ms) L-R Lat (ms) L Amp (V) R Amp (V) L-R Amp (%) Site1 Site2 L Vel (m/s) R Vel (m/s) L-R Vel  (m/s)  Median Acr Palm Anti Sensory (2nd Digit)  30.8C  Wrist 3.3   27.1   Wrist Palm     Palm 1.9   50.7         Radial Anti Sensory (Base 1st Digit)  31C  Wrist 2.2   24.7   Wrist Base 1st Digit     Ulnar Anti Sensory (5th Digit)  31.3C  Wrist 3.5   35.5   Wrist 5th Digit 40     Motor Left/Right  Comparison   Stim Site L Lat (ms) R Lat (ms) L-R Lat (ms) L Amp (mV) R Amp (mV) L-R Amp (%) Site1 Site2 L Vel (m/s) R Vel (m/s) L-R Vel (m/s)  Median Motor (Abd Poll Brev)  31.1C  Wrist 3.4   5.9   Elbow Wrist 57    Elbow 7.1   5.7         Ulnar Motor (Abd Dig Min)  31.3C  Wrist 3.6   9.1   B Elbow Wrist *51    B Elbow 7.3   8.1   A Elbow B Elbow 53    A Elbow 9.2   8.8            Waveforms:             Clinical History: No specialty comments available.   She reports that she has never smoked. She has never used smokeless tobacco.  Recent Labs    05/19/22 1055  HGBA1C 9.7*    Objective:  VS:  HT:    WT:   BMI:     BP:   HR: bpm  TEMP: ( )  RESP:  Physical Exam Vitals and nursing note reviewed.  Constitutional:      General: She is not in acute distress.    Appearance: Normal appearance. She is well-developed. She is not ill-appearing.  HENT:     Head: Normocephalic and atraumatic.  Eyes:     Conjunctiva/sclera: Conjunctivae normal.     Pupils: Pupils are equal, round, and reactive to light.  Cardiovascular:     Rate and Rhythm: Normal rate.     Pulses: Normal pulses.  Pulmonary:     Effort: Pulmonary effort is normal.  Musculoskeletal:        General: No swelling, tenderness or deformity.     Right lower leg: No edema.     Left lower leg: No edema.     Comments: Inspection reveals no atrophy of the bilateral APB or FDI or hand intrinsics. There is no swelling, color changes, allodynia or dystrophic changes. There is 5 out of 5 strength in the bilateral wrist extension, finger abduction and long finger flexion. There is intact sensation to light touch in  all dermatomal and peripheral nerve distributions. There is a negative Tinel's test at the bilateral wrist and elbow. There is a negative Phalen's test bilaterally. There is a negative Hoffmann's test bilaterally.  Skin:    General: Skin is warm and dry.     Findings: No erythema or rash.  Neurological:     General: No focal deficit present.     Mental Status: She is alert and oriented to person, place, and time.     Sensory: No sensory deficit.     Motor: No weakness or abnormal muscle tone.     Coordination: Coordination normal.     Gait: Gait normal.  Psychiatric:        Mood and Affect: Mood normal.        Behavior: Behavior normal.     Ortho Exam  Imaging: No results found.  Past Medical/Family/Surgical/Social History: Medications & Allergies reviewed per EMR, new medications updated. Patient Active Problem List   Diagnosis Date Noted   Type 2 diabetes mellitus 06/19/2022   Hypertension 06/02/2022   Encounter for routine adult physical exam with abnormal findings 05/19/2022   History of adenomatous polyp of colon 07/04/2020   Acute hypoxemic respiratory failure due to COVID-19 03/30/2020   Hepatic cirrhosis 01/19/2018  Colon cancer screening 01/19/2018   Chronic diarrhea 12/31/2011   Past Medical History:  Diagnosis Date   Asthma    Bone spur    Carpal tunnel syndrome    Complication of anesthesia    Patient didnt wake up for 24 hours after anesthesia   Diabetes    HTN (hypertension)    IBS (irritable bowel syndrome)    chronic diarrhea   Paresthesias    left side   Pneumonia 03/2020   Sciatica    Family History  Problem Relation Age of Onset   Coronary artery disease Father    Diabetes Father    Brain cancer Brother    Diverticulitis Sister    Colon cancer Neg Hx    Colon polyps Neg Hx    Past Surgical History:  Procedure Laterality Date   ABDOMINAL HYSTERECTOMY     partial   CARPAL TUNNEL RELEASE     bilat, [place]   CESAREAN SECTION      COLONOSCOPY  07/18/2010   Fields-SIMPLE ADENOMA (Next colonoscopy  06/2020)   COLONOSCOPY WITH PROPOFOL N/A 08/20/2020   Procedure: COLONOSCOPY WITH PROPOFOL;  Surgeon: Lanelle Balarver, Charles K, DO;  Location: [facility];  Service: Endoscopy;  Laterality: N/A;  9:00am   Social History   Occupational History   Occupation: Disabled, previous [employer] [phone]  Tobacco Use   Smoking status: Never   Smokeless tobacco: Never  Vaping Use   Vaping Use: Never used  Substance and Sexual Activity   Alcohol use: Yes    Comment: occas   Drug use: No   Sexual activity: Not Currently

## 2022-06-29 NOTE — Procedures (Signed)
EMG & NCV Findings: Evaluation of the left ulnar motor nerve showed borderline decreased conduction velocity (B Elbow-Wrist, 51 m/s).  All remaining nerves (as indicated in the following tables) were within normal limits.    All examined muscles (as indicated in the following table) showed no evidence of electrical instability.    Impression: Essentially NORMAL electrodiagnostic study of the left upper limb.  There is no significant electrodiagnostic evidence of nerve entrapment, brachial plexopathy or cervical radiculopathy.  **As you know, purely sensory or demyelinating radiculopathies and chemical radiculitis may not be detected with this particular electrodiagnostic study.  Borderline slowing at the elbow is distal to the ulnar groove and likely technical artifact.  Recommendations: 1.  Follow-up with referring physician. 2.  Continue current management of symptoms.  Consider cervical MRI if felt clinically necessary.  ___________________________ Megan Salazar FAAPMR Board Certified, American Board of Physical Medicine and Rehabilitation    Nerve Conduction Studies Anti Sensory Summary Table   Stim Site NR Peak (ms) Norm Peak (ms) P-T Amp (V) Norm P-T Amp Site1 Site2 Delta-P (ms) Dist (cm) Vel (m/s) Norm Vel (m/s)  Left Median Acr Palm Anti Sensory (2nd Digit)  30.8C  Wrist    3.3 <3.6 27.1 >10 Wrist Palm 1.4 0.0    Palm    1.9 <2.0 50.7         Left Radial Anti Sensory (Base 1st Digit)  31C  Wrist    2.2 <3.1 24.7  Wrist Base 1st Digit 2.2 0.0    Left Ulnar Anti Sensory (5th Digit)  31.3C  Wrist    3.5 <3.7 35.5 >15.0 Wrist 5th Digit 3.5 14.0 40 >38   Motor Summary Table   Stim Site NR Onset (ms) Norm Onset (ms) O-P Amp (mV) Norm O-P Amp Site1 Site2 Delta-0 (ms) Dist (cm) Vel (m/s) Norm Vel (m/s)  Left Median Motor (Abd Poll Brev)  31.1C  Wrist    3.4 <4.2 5.9 >5 Elbow Wrist 3.7 21.0 57 >50  Elbow    7.1  5.7         Left Ulnar Motor (Abd Dig Min)  31.3C  Wrist    3.6  <4.2 9.1 >3 B Elbow Wrist 3.7 19.0 *51 >53  B Elbow    7.3  8.1  A Elbow B Elbow 1.9 10.0 53 >53  A Elbow    9.2  8.8          EMG   Side Muscle Nerve Root Ins Act Fibs Psw Amp Dur Poly Recrt Int Dennie Bible Comment  Left Abd Poll Brev Median C8-T1 Nml Nml Nml Nml Nml 0 Nml Nml   Left 1stDorInt Ulnar C8-T1 Nml Nml Nml Nml Nml 0 Nml Nml   Left PronatorTeres Median C6-7 Nml Nml Nml Nml Nml 0 Nml Nml   Left Biceps Musculocut C5-6 Nml Nml Nml Nml Nml 0 Nml Nml   Left Deltoid Axillary C5-6 Nml Nml Nml Nml Nml 0 Nml Nml     Nerve Conduction Studies Anti Sensory Left/Right Comparison   Stim Site L Lat (ms) R Lat (ms) L-R Lat (ms) L Amp (V) R Amp (V) L-R Amp (%) Site1 Site2 L Vel (m/s) R Vel (m/s) L-R Vel (m/s)  Median Acr Palm Anti Sensory (2nd Digit)  30.8C  Wrist 3.3   27.1   Wrist Palm     Palm 1.9   50.7         Radial Anti Sensory (Base 1st Digit)  31C  Wrist 2.2  24.7   Wrist Base 1st Digit     Ulnar Anti Sensory (5th Digit)  31.3C  Wrist 3.5   35.5   Wrist 5th Digit 40     Motor Left/Right Comparison   Stim Site L Lat (ms) R Lat (ms) L-R Lat (ms) L Amp (mV) R Amp (mV) L-R Amp (%) Site1 Site2 L Vel (m/s) R Vel (m/s) L-R Vel (m/s)  Median Motor (Abd Poll Brev)  31.1C  Wrist 3.4   5.9   Elbow Wrist 57    Elbow 7.1   5.7         Ulnar Motor (Abd Dig Min)  31.3C  Wrist 3.6   9.1   B Elbow Wrist *51    B Elbow 7.3   8.1   A Elbow B Elbow 53    A Elbow 9.2   8.8            Waveforms:

## 2022-06-30 ENCOUNTER — Ambulatory Visit: Payer: Medicaid Other | Admitting: Family Medicine

## 2022-07-01 ENCOUNTER — Ambulatory Visit: Payer: Medicaid Other | Admitting: Orthopedic Surgery

## 2022-07-01 ENCOUNTER — Encounter: Payer: Self-pay | Admitting: Orthopedic Surgery

## 2022-07-01 DIAGNOSIS — M19012 Primary osteoarthritis, left shoulder: Secondary | ICD-10-CM

## 2022-07-01 NOTE — Patient Instructions (Signed)

## 2022-07-02 NOTE — Progress Notes (Signed)
Return Patient Visit  Assessment: Megan Salazar is a 62 y.o. female with the following: 1. Glenohumeral arthritis, left    Plan: Megan Salazar has severe left glenohumeral joint arthritis.  She continues to have severe pain, with restricted motion.  It is affecting her function.  Last injection was almost 3 months ago.  She is interested in proceeding with subacromial steroid injection in clinic today.  This was completed without issues.  EMG results are negative for nerve compression.  We did briefly discuss proceeding with surgery, but upon review of her a A1c, it was noted to be greater than 9.  This will need to improve.  She is aware.  Continue with activities as tolerated.  She will follow-up as needed.  Procedure note injection Left shoulder    Verbal consent was obtained to inject the left shoulder, subacromial space Timeout was completed to confirm the site of injection.  The skin was prepped with alcohol and ethyl chloride was sprayed at the injection site.  A 21-gauge needle was used to inject 40 mg of Depo-Medrol and 1% lidocaine (3 cc) into the subacromial space of the left shoulder using a posterolateral approach.  There were no complications. A sterile bandage was applied.  Follow-up: Return if symptoms worsen or fail to improve.  Subjective:  Chief Complaint  Patient presents with   Arm Pain    L shoulder pain going to hand. Here for EMG results.    History of Present Illness: Megan Salazar is a 62 y.o. female who returns to clinic today for repeat evaluation of her left shoulder pain.  At the last clinic visit, she discussed radiating pains in her hand, with some associated numbness and tingling.  She has obtained EMG results, and is here to discuss the findings.  Left shoulder pain continues to get worse.  She has difficulty lifting her arm.  She really enjoys swimming, but has been unable to get in the pool due to the pain in her left  shoulder.   Review of Systems: No fevers or chills No numbness or tingling No chest pain No shortness of breath No bowel or bladder dysfunction No GI distress No headaches   Objective: There were no vitals taken for this visit.  Physical Exam:  General: Alert and oriented. and No acute distress. Gait: Normal gait.   severely limited range of motion in her left shoulder.  Difficulty getting her arm to 90 degrees of forward flexion.  Difficulty getting her arm to her lower back.  There is crepitus with range of motion testing.  IMAGING: I personally ordered and reviewed the following images  EMG results  Essentially NORMAL electrodiagnostic study of the left upper limb.  There is no significant electrodiagnostic evidence of nerve entrapment, brachial plexopathy or cervical radiculopathy.    New Medications:  No orders of the defined types were placed in this encounter.     Oliver Barre, MD  07/02/2022 11:20 PM

## 2022-07-14 ENCOUNTER — Ambulatory Visit: Payer: Medicaid Other | Admitting: Orthopedic Surgery

## 2022-09-16 ENCOUNTER — Telehealth: Payer: Self-pay | Admitting: Family Medicine

## 2022-09-16 NOTE — Telephone Encounter (Signed)
Patient called said her shot trulicity was possible defect, what does patient need to do call back # [phone].

## 2022-09-16 NOTE — Telephone Encounter (Signed)
Left message advicing her to check with her pharmacy about this.

## 2022-09-17 ENCOUNTER — Telehealth: Payer: Self-pay | Admitting: Family Medicine

## 2022-09-17 ENCOUNTER — Ambulatory Visit: Payer: Medicaid Other | Admitting: Family Medicine

## 2022-09-17 ENCOUNTER — Encounter: Payer: Self-pay | Admitting: Family Medicine

## 2022-09-17 VITALS — BP 122/63 | HR 81 | Temp 99.0°F | Ht 64.0 in | Wt 203.0 lb

## 2022-09-17 DIAGNOSIS — I10 Essential (primary) hypertension: Secondary | ICD-10-CM

## 2022-09-17 DIAGNOSIS — Z13220 Encounter for screening for lipoid disorders: Secondary | ICD-10-CM

## 2022-09-17 DIAGNOSIS — E559 Vitamin D deficiency, unspecified: Secondary | ICD-10-CM | POA: Diagnosis not present

## 2022-09-17 DIAGNOSIS — E119 Type 2 diabetes mellitus without complications: Secondary | ICD-10-CM | POA: Diagnosis not present

## 2022-09-17 NOTE — Assessment & Plan Note (Signed)
Last Hemoglobin A1C 9.7 not at goal on 05/19/22  Labs ordered today awaiting results will follow up. Patient reporting taking   Trulicity 0.75 mg injection once a week.Discussed medication desired effects, potential side effects, and how to administer the medication. Nonpharmacological interventions such as low carb diet,high in protein, vegetables and fruit discussed. Educated on importance of physical activity 150 minutes per week. Discussed signs and symptoms of hypoglycemia, & hyperglycemia and need to present to the ED if symptoms occurs.Follow up in 3 months or sooner if needed. Patient verbalizes understanding regarding plan of care and all questions answered. Ophthalmology up to date on December 2023 , Foot exam within desired limits

## 2022-09-17 NOTE — Patient Instructions (Signed)

## 2022-09-17 NOTE — Telephone Encounter (Signed)
Okay great!

## 2022-09-17 NOTE — Assessment & Plan Note (Signed)
Vitals:   09/17/22 0808  BP: 122/63   Blood pressure controlled in today's visit Continue olmesartan-hydrochlorothiazide 20-12.5 mg once a day. Continued discussion on DASH diet, low sodium diet and maintain a exercise routine for 150 minutes per week.

## 2022-09-17 NOTE — Progress Notes (Signed)
Patient Office Visit   Subjective   Patient ID: Megan Salazar, female    DOB: August 23, 1960  Age: 62 y.o. MRN: [id number]  CC:  Chief Complaint  Patient presents with   Follow-up    follow up 4 wks, HTN/Recheck BP    HPI Megan Salazar 62 year old female, presents to the clinic for Type 2 diabetes and HTN follow up. She  has a past medical history of Asthma, Bone spur, Carpal tunnel syndrome, Complication of anesthesia, Diabetes (HCC), HTN (hypertension), IBS (irritable bowel syndrome), Paresthesias, Pneumonia (03/2020), and Sciatica.  Diabetes She presents for her follow-up diabetic visit. She has type 2 diabetes mellitus. Her disease course has been fluctuating. Pertinent negatives for hypoglycemia include no dizziness or tremors. Pertinent negatives for diabetes include no blurred vision, no chest pain and no visual change. Pertinent negatives for hypoglycemia complications include no blackouts. Symptoms are improving. Risk factors for coronary artery disease include diabetes mellitus and hypertension. Current diabetic treatments: Truclity injection once a week. She is compliant with treatment all of the time. Her weight is fluctuating minimally. She is following a generally healthy diet. She participates in exercise every other day. Her home blood glucose trend is fluctuating minimally. Her breakfast blood glucose range is generally 70-90 mg/dl. Her lunch blood glucose range is generally 110-130 mg/dl. Her dinner blood glucose range is generally 110-130 mg/dl. Her overall blood glucose range is 110-130 mg/dl. An ACE inhibitor/angiotensin II receptor blocker is being taken. She does not see a podiatrist.Eye exam is current.      Outpatient Encounter Medications as of 09/17/2022  Medication Sig   albuterol (PROVENTIL) (2.5 MG/3ML) 0.083% nebulizer solution Take 3 mLs (2.5 mg total) by nebulization every 6 (six) hours as needed for wheezing or shortness of breath.   albuterol  (VENTOLIN HFA) 108 (90 Base) MCG/ACT inhaler Inhale 2 puffs into the lungs every 6 (six) hours as needed for wheezing or shortness of breath.   Artificial Tear Ointment (DRY EYES OP) Place 1 drop into both eyes daily as needed (dry eyes).   azelastine (ASTELIN) 0.1 % nasal spray Place 1 spray into both nostrils 2 (two) times daily. Use in each nostril as directed   Blood Glucose Monitoring Suppl DEVI 1 each by Does not apply route in the morning, at noon, and at bedtime. May substitute to any manufacturer covered by patient's insurance.   CALCIUM-MAGNESIUM-VITAMIN D PO Take 1 tablet by mouth daily.   CINNAMON PO Take 1,000 mg by mouth daily.   Coenzyme Q10 (COQ10) 200 MG CAPS Take 200 mg by mouth daily.   Cyanocobalamin (VITAMIN B-12 PO) Take 500 mcg by mouth daily.   Dulaglutide (TRULICITY) 0.75 MG/0.5ML SOPN Inject 0.75 mg into the skin once a week.   Flaxseed, Linseed, (FLAXSEED OIL PO) Take 1,000 mg by mouth daily.   fluticasone (FLOVENT HFA) 110 MCG/ACT inhaler Inhale 1 puff into the lungs 2 (two) times daily as needed (shortness of breath).   Glucose Blood (BLOOD GLUCOSE TEST STRIPS) STRP 1 each by In Vitro route in the morning, at noon, and at bedtime. May substitute to any manufacturer covered by patient's insurance.   Lancets (FREESTYLE) lancets Use as instructed   Menthol-Methyl Salicylate (MUSCLE RUB) 10-15 % CREA Apply 1 application topically as needed for muscle pain.   Misc Natural Products (TART CHERRY ADVANCED PO) Take 1 capsule by mouth daily.   olmesartan-hydrochlorothiazide (BENICAR HCT) 20-12.5 MG tablet Take 1 tablet by mouth daily.   OVER THE  COUNTER MEDICATION Take 1 capsule by mouth every evening. Doterra deep blue   rosuvastatin (CRESTOR) 5 MG tablet Take 1 tablet (5 mg total) by mouth daily.   TURMERIC PO Take 1,000 mg by mouth daily.   Charcoal Activated 260 MG CAPS Take 260 mg by mouth See admin instructions. Every 10 days (Patient not taking: Reported on 09/17/2022)    cyclobenzaprine (FLEXERIL) 5 MG tablet TAKE (1) TABLET BY MOUTH TWICE DAILY AS NEEDED. (Patient not taking: Reported on 09/17/2022)   gabapentin (NEURONTIN) 100 MG capsule Take 1 capsule (100 mg total) by mouth 3 (three) times daily. (Patient not taking: Reported on 09/17/2022)   montelukast (SINGULAIR) 10 MG tablet Take 1 tablet (10 mg total) by mouth at bedtime. (Patient not taking: Reported on 09/17/2022)   naproxen (NAPROSYN) 375 MG tablet Take 1 tablet (375 mg total) by mouth 2 (two) times daily as needed. Take with food to prevent GI upset (Patient not taking: Reported on 09/17/2022)   Probiotic Product (PROBIOTIC DAILY PO) Take 1 each by mouth daily. Gummy (Patient not taking: Reported on 09/17/2022)   [DISCONTINUED] empagliflozin (JARDIANCE) 10 MG TABS tablet Take 1 tablet (10 mg total) by mouth daily before breakfast. (Patient not taking: Reported on 09/17/2022)   No facility-administered encounter medications on file as of 09/17/2022.    Past Surgical History:  Procedure Laterality Date   ABDOMINAL HYSTERECTOMY     partial   CARPAL TUNNEL RELEASE     bilat, [place]   CESAREAN SECTION     COLONOSCOPY  07/18/2010   Fields-SIMPLE ADENOMA (Next colonoscopy  06/2020)   COLONOSCOPY WITH PROPOFOL N/A 08/20/2020   Procedure: COLONOSCOPY WITH PROPOFOL;  Surgeon: Lanelle Bal, DO;  Location: [facility];  Service: Endoscopy;  Laterality: N/A;  9:00am    Review of Systems  Constitutional:  Negative for chills and fever.  Eyes:  Negative for blurred vision.  Respiratory:  Negative for shortness of breath.   Cardiovascular:  Negative for chest pain.  Gastrointestinal:  Negative for abdominal pain.  Neurological:  Negative for dizziness and tremors.      Objective    BP 122/63   Pulse 81   Temp 99 F (37.2 C) (Oral)   Ht 5\' 4"  (1.626 m)   Wt 203 lb (92.1 kg)   SpO2 96%   BMI 34.84 kg/m   Physical Exam Vitals reviewed.  Constitutional:      General: She is not in acute  distress.    Appearance: Normal appearance. She is not ill-appearing, toxic-appearing or diaphoretic.  HENT:     Head: Normocephalic.  Eyes:     General:        Right eye: No discharge.        Left eye: No discharge.     Conjunctiva/sclera: Conjunctivae normal.  Cardiovascular:     Rate and Rhythm: Normal rate.     Pulses: Normal pulses.     Heart sounds: Normal heart sounds.  Pulmonary:     Effort: Pulmonary effort is normal. No respiratory distress.     Breath sounds: Normal breath sounds.  Musculoskeletal:        General: Normal range of motion.     Cervical back: Normal range of motion.  Skin:    General: Skin is warm and dry.     Capillary Refill: Capillary refill takes less than 2 seconds.  Neurological:     General: No focal deficit present.     Mental Status: She is alert and  oriented to person, place, and time.     Coordination: Coordination normal.     Gait: Gait normal.  Psychiatric:        Mood and Affect: Mood normal.        Behavior: Behavior normal.       Assessment & Plan:  Diabetic eye exam (HCC) -     Ambulatory referral to Ophthalmology  Type 2 diabetes mellitus without complication, without long-term current use of insulin (HCC) Assessment & Plan: Last Hemoglobin A1C 9.7 not at goal on 05/19/22  Labs ordered today awaiting results will follow up. Patient reporting taking   Trulicity 0.75 mg injection once a week.Discussed medication desired effects, potential side effects, and how to administer the medication. Nonpharmacological interventions such as low carb diet,high in protein, vegetables and fruit discussed. Educated on importance of physical activity 150 minutes per week. Discussed signs and symptoms of hypoglycemia, & hyperglycemia and need to present to the ED if symptoms occurs.Follow up in 3 months or sooner if needed. Patient verbalizes understanding regarding plan of care and all questions answered. Ophthalmology up to date on December 2023 ,  Foot exam within desired limits   Orders: -     Hemoglobin A1c -     Microalbumin / creatinine urine ratio  Screening for lipid disorders -     Lipid panel  Vitamin D deficiency -     VITAMIN D 25 Hydroxy (Vit-D Deficiency, Fractures)  Primary hypertension Assessment & Plan: Vitals:   09/17/22 0808  BP: 122/63   Blood pressure controlled in today's visit Continue olmesartan-hydrochlorothiazide 20-12.5 mg once a day. Continued discussion on DASH diet, low sodium diet and maintain a exercise routine for 150 minutes per week.    Orders: -     BASIC METABOLIC PANEL WITH GFR    Return in about 4 months (around 01/17/2023) for chronic follow-up, routine labs.   Cruzita Lederer Newman Nip, FNP

## 2022-09-17 NOTE — Telephone Encounter (Signed)
FYI, patient called to let Tenna Child know she ate a big breakfast and her blood sugar check at 2:20 pm was 129 and numbers are good per patient.

## 2022-09-18 DIAGNOSIS — E119 Type 2 diabetes mellitus without complications: Secondary | ICD-10-CM | POA: Diagnosis not present

## 2022-09-18 DIAGNOSIS — E559 Vitamin D deficiency, unspecified: Secondary | ICD-10-CM | POA: Diagnosis not present

## 2022-09-18 DIAGNOSIS — Z13220 Encounter for screening for lipoid disorders: Secondary | ICD-10-CM | POA: Diagnosis not present

## 2022-09-19 LAB — HEMOGLOBIN A1C
Est. average glucose Bld gHb Est-mCnc: 137 mg/dL
Hgb A1c MFr Bld: 6.4 % — ABNORMAL HIGH (ref 4.8–5.6)

## 2022-09-19 LAB — LIPID PANEL
Chol/HDL Ratio: 2.6 ratio (ref 0.0–4.4)
Cholesterol, Total: 110 mg/dL (ref 100–199)
HDL: 42 mg/dL (ref >39)
LDL Chol Calc (NIH): 46 mg/dL (ref 0–99)
Triglycerides: 120 mg/dL (ref 0–149)
VLDL Cholesterol Cal: 22 mg/dL (ref 5–40)

## 2022-09-19 LAB — BASIC METABOLIC PANEL
BUN/Creatinine Ratio: 22 (ref 12–28)
BUN: 20 mg/dL (ref 8–27)
CO2: 24 mmol/L (ref 20–29)
Calcium: 9.4 mg/dL (ref 8.7–10.3)
Chloride: 102 mmol/L (ref 96–106)
Creatinine, Ser: 0.89 mg/dL (ref 0.57–1.00)
Glucose: 132 mg/dL — ABNORMAL HIGH (ref 70–99)
Potassium: 5.1 mmol/L (ref 3.5–5.2)
Sodium: 142 mmol/L (ref 134–144)
eGFR: 72 mL/min/{1.73_m2} (ref >59)

## 2022-09-19 LAB — SPECIMEN STATUS REPORT

## 2022-09-19 LAB — VITAMIN D 25 HYDROXY (VIT D DEFICIENCY, FRACTURES): Vit D, 25-Hydroxy: 19 ng/mL — ABNORMAL LOW (ref 30.0–100.0)

## 2022-09-21 ENCOUNTER — Other Ambulatory Visit: Payer: Self-pay | Admitting: Family Medicine

## 2022-09-21 ENCOUNTER — Telehealth: Payer: Self-pay | Admitting: Family Medicine

## 2022-09-21 MED ORDER — TRULICITY 1.5 MG/0.5ML ~~LOC~~ SOAJ
1.5000 mg | SUBCUTANEOUS | 0 refills | Status: DC
Start: 2022-09-21 — End: 2022-11-11

## 2022-09-21 NOTE — Progress Notes (Signed)
Please inform patient,  Randie Heinz job on Lowering Hemoglobin A1c levels Plan of treatment will include Trulicity 1.5 mg injection once a week. We will increase dosage monthly - Medication sent to pharmacy Vitamin D levels low, I advise to taking  over the counter supplements of vitamin D 1000 IU/day to prevent low vitamin D levels. Consuming Vitamin D rich food sources include fish, salmon, sardines, egg yolks, red meat, liver, oranges, soy milk.

## 2022-09-21 NOTE — Telephone Encounter (Signed)
Return call for labs  Leave info on voicemail if unavailable

## 2022-09-21 NOTE — Telephone Encounter (Signed)
Spoke with patient.

## 2022-09-29 ENCOUNTER — Telehealth: Payer: Self-pay | Admitting: Family Medicine

## 2022-09-29 MED ORDER — ACCU-CHEK SOFTCLIX LANCET DEV KIT: PACK | 3 refills | Status: DC

## 2022-09-29 MED ORDER — ACCU-CHEK SOFTCLIX LANCETS MISC: 12 refills | Status: DC

## 2022-09-29 NOTE — Telephone Encounter (Signed)
Diabetic pen that patient was given from our office is no longer working, accu chek and pen is soft every time pull the lid off with the needle with her carpel tunnel she can not do this, is there alternative that will help patient easier to use, she does not like changing the needles, it takes her 20 to 30 minutes to use can not deal with this. It is stuck in the yellow position and it will not even work now.  Call patient at [phone].

## 2022-09-29 NOTE — Telephone Encounter (Signed)
Patient has came by the office at 1:47 pm waited in the lobby for a nurse to speak to her about her broken pen. No nurse came out she left the pen at the front window and asked if a nurse can please have a new one at the front window and she will be by today by close to pick it up.

## 2022-09-29 NOTE — Addendum Note (Signed)
Addended by: Abner Greenspan on: 09/29/2022 04:06 PM   Modules accepted: Orders

## 2022-09-29 NOTE — Telephone Encounter (Signed)
Patient picked up rx for diabetic pen

## 2022-10-15 ENCOUNTER — Telehealth: Payer: Self-pay | Admitting: Orthopedic Surgery

## 2022-10-15 NOTE — Telephone Encounter (Signed)
[name] pt - pt lvm stating she had her A1C checked again on 09/17/22 and it was 6.4.  She wants to know if it's low enough for surgery.

## 2022-10-15 NOTE — Telephone Encounter (Signed)
Yes, A1C is in range, if she wants surgery, she will need appointment to discuss clearances needed and surgical procedure risks and benefits  Please make follow up appointment for surgical eval (its a little unclear what she wants surgery on, perhaps shoulder?) either way she needs appointment, thanks.

## 2022-10-26 ENCOUNTER — Other Ambulatory Visit: Payer: Self-pay | Admitting: Family Medicine

## 2022-10-26 DIAGNOSIS — I10 Essential (primary) hypertension: Secondary | ICD-10-CM

## 2022-11-02 DIAGNOSIS — M62830 Muscle spasm of back: Secondary | ICD-10-CM | POA: Diagnosis not present

## 2022-11-02 DIAGNOSIS — M9901 Segmental and somatic dysfunction of cervical region: Secondary | ICD-10-CM | POA: Diagnosis not present

## 2022-11-02 DIAGNOSIS — M9903 Segmental and somatic dysfunction of lumbar region: Secondary | ICD-10-CM | POA: Diagnosis not present

## 2022-11-02 DIAGNOSIS — M9902 Segmental and somatic dysfunction of thoracic region: Secondary | ICD-10-CM | POA: Diagnosis not present

## 2022-11-02 DIAGNOSIS — M546 Pain in thoracic spine: Secondary | ICD-10-CM | POA: Diagnosis not present

## 2022-11-02 DIAGNOSIS — M542 Cervicalgia: Secondary | ICD-10-CM | POA: Diagnosis not present

## 2022-11-03 ENCOUNTER — Encounter: Payer: Self-pay | Admitting: Orthopedic Surgery

## 2022-11-03 ENCOUNTER — Ambulatory Visit: Payer: Medicaid Other | Admitting: Orthopedic Surgery

## 2022-11-03 ENCOUNTER — Other Ambulatory Visit: Payer: Self-pay

## 2022-11-03 DIAGNOSIS — M19012 Primary osteoarthritis, left shoulder: Secondary | ICD-10-CM

## 2022-11-03 NOTE — Progress Notes (Unsigned)
114/75 pluse 70 Temp 98.6

## 2022-11-03 NOTE — Patient Instructions (Signed)
Please call if you are interested in proceeding with left shoulder replacement

## 2022-11-04 NOTE — Progress Notes (Signed)
Return Patient Visit  Assessment: Megan Salazar is a 62 y.o. female with the following: 1. Glenohumeral arthritis, left    Plan: Megan Salazar has severe left glenohumeral joint arthritis.  We have previously discussed total shoulder replacement.  However, her A1c was an issue.  This has now resolved.  She is doing much better, and most recent A1c was less than 7.  Otherwise, she states that her left shoulder is doing well at this time.  She has been limited in her activities for multiple reasons, but is not interested in shoulder replacement at this time.  We reviewed updated radiographs in clinic today which demonstrates severe degenerative changes, with flattening of the humeral head.  We reviewed the procedure in detail.  All questions have been answered.  I provided her with pamphlets in clinic today.  If her shoulder worsens, or she is interested in pursuing surgery at any time, I have asked her to contact clinic.  Otherwise, follow-up as needed.   Follow-up: Return if symptoms worsen or fail to improve.  Subjective:  Chief Complaint  Patient presents with   Shoulder Pain    L shoulder preop      History of Present Illness: Megan Salazar is a 62 y.o. female who returns to clinic today for repeat evaluation of her left shoulder pain.  She has known left shoulder arthritis.  She has limited motion.  Currently, her pain is not severe.  For multiple reasons, she has not been as active as she usually is.  As such, she is not having as much pain.  She is still interested in avoiding surgery.  However, her A1c has significantly improved, and is now less than 7.  As such, she would be candidate.  Review of Systems: No fevers or chills No numbness or tingling No chest pain No shortness of breath No bowel or bladder dysfunction No GI distress No headaches   Objective: There were no vitals taken for this visit.  Physical Exam:  General: Alert and oriented.  and No acute distress. Gait: Normal gait.   severely limited range of motion in her left shoulder.  Difficulty getting her arm to 90 degrees of forward flexion.  Difficulty getting her arm to her lower back.  There is crepitus with range of motion testing.  IMAGING: I personally ordered and reviewed the following images  X-rays of the left shoulder were obtained in clinic today.  No acute injuries are noted.  Advanced degenerative changes of the glenohumeral joint.  There is flattening of the humeral head.  Inferior osteophytes.  The joint is reduced.  No evidence of proximal humeral migration.  No bony lesions.  Impression: Severe left glenohumeral joint arthritis  New Medications:  No orders of the defined types were placed in this encounter.     Oliver Barre, MD  11/04/2022 8:26 AM

## 2022-11-05 ENCOUNTER — Other Ambulatory Visit: Payer: Self-pay | Admitting: Orthopedic Surgery

## 2022-11-11 ENCOUNTER — Other Ambulatory Visit: Payer: Self-pay | Admitting: Family Medicine

## 2022-11-24 ENCOUNTER — Telehealth: Payer: Self-pay | Admitting: Orthopedic Surgery

## 2022-11-24 NOTE — Telephone Encounter (Signed)
[name] pt - pt lvm that she is ready to have her surgery asap

## 2022-11-25 ENCOUNTER — Telehealth: Payer: Self-pay | Admitting: Orthopedic Surgery

## 2022-11-25 DIAGNOSIS — M19012 Primary osteoarthritis, left shoulder: Secondary | ICD-10-CM

## 2022-11-25 NOTE — Telephone Encounter (Signed)
Spoke with pt and let her know the process has been started to get her scheduled. Pt verbalized understanding and knows we are waiting for clearances, and CT results to move forward.

## 2022-11-25 NOTE — Telephone Encounter (Signed)
Patient called and states she is ready for her surgery she is in a lot of pain.   Please call her back at [phone].  If you need to leave a message that is fine and she will call you back,.

## 2022-11-26 ENCOUNTER — Other Ambulatory Visit: Payer: Self-pay | Admitting: Family Medicine

## 2022-11-30 ENCOUNTER — Telehealth: Payer: Self-pay | Admitting: Orthopedic Surgery

## 2022-11-30 ENCOUNTER — Telehealth: Payer: Self-pay | Admitting: Family Medicine

## 2022-11-30 NOTE — Telephone Encounter (Signed)
Can you please schedule pt for a preop appt with PCP, pt prefers next week if any availability.Marland KitchenMarland Kitchen

## 2022-11-30 NOTE — Telephone Encounter (Signed)
Patient came in the office stating she is scheduled for lab work today with [name] and she has to get this blood work done before she can have her surgery. She has fasted since 10:30 last night.    Please advise  Thank You

## 2022-11-30 NOTE — Telephone Encounter (Signed)
Looking back at TE an office visit is needed for surgery clearance, labs would be ordered at that visit, left vm asking for a return call

## 2022-11-30 NOTE — Telephone Encounter (Signed)
Patient LVM says she is confused if she is supposed to have labs drawn or not- says she came in and there were no orders. Supposed to have surgery soon, please advise pt home # [phone] Thank you

## 2022-12-02 NOTE — Telephone Encounter (Signed)
LVM for patient to call back and schedule next available with Tenna Child

## 2022-12-09 ENCOUNTER — Encounter: Payer: Self-pay | Admitting: Family Medicine

## 2022-12-09 ENCOUNTER — Ambulatory Visit: Payer: Medicaid Other | Admitting: Family Medicine

## 2022-12-09 VITALS — BP 128/73 | HR 71 | Ht 64.0 in | Wt 196.0 lb

## 2022-12-09 DIAGNOSIS — Z01818 Encounter for other preprocedural examination: Secondary | ICD-10-CM | POA: Diagnosis not present

## 2022-12-09 DIAGNOSIS — I10 Essential (primary) hypertension: Secondary | ICD-10-CM

## 2022-12-09 LAB — EKG 12-LEAD

## 2022-12-09 MED ORDER — TRULICITY 3 MG/0.5ML ~~LOC~~ SOAJ: 3.0000 mg | SUBCUTANEOUS | 0 refills | Status: DC

## 2022-12-09 NOTE — Progress Notes (Addendum)
Patient Office Visit   Subjective   Patient ID: Megan Salazar, female    DOB: 06-11-1960  Age: 62 y.o. MRN: [id number]  CC:  Chief Complaint  Patient presents with   Pre-op Exam    Labs needed for surgery clearance.    Medication Refill    All meds.     HPI Megan Salazar 62 year old female, presents to clinic for preoperative surgical clearance. She  has a past medical history of Asthma, Bone spur, Carpal tunnel syndrome, Complication of anesthesia, Diabetes (HCC), HTN (hypertension), IBS (irritable bowel syndrome), Paresthesias, Pneumonia (03/2020), and Sciatica.  Pt is a 62 y.o. female who is here for preoperative clearance for total shoulder replacement.  1) High Risk Cardiac Conditions:  1) Recent MI - No.  2) Decompensated Heart Failure - No  3) Unstable angina - No.  4) Symptomatic arrythmia - No.  5) Sx Valvular Disease - No.  2) Intermediate Risk Factors: Type 2 diabetes   2) Functional Status: > 4 mets (Walk, run, climb stairs) Yes.   3) Surgery Specific Risk:   Intermediate Orthopaedic                   4) Further Noninvasive evaluation:   1) EKG - Yes.     Hx of  DM  2) Echo - No.     3) Stress Testing - Active Cardiac Disease - No.  4) CXR No.   If asymptomatic, healthy, no respiratory symptoms it is not needed  5)PFTs No.     5) Need for medical therapy - Yes.  Current hypertension medication regimen included olmesartan-hydrochlorothiazide 20-12.6 mg daily and Hyperlipidemia control on Crestor 5 mg  daily.  6) Patient needs to be off Trulicity for at least 7 days prior to surgery.  New complaints: None   Allergies and medications reviewed and updated.  I have independently evaluated patient.  Megan Salazar is a 62 y.o. female who is low risk for total shoulder replacement  There are not modifiable risk factors (smoking, etc). Megan Salazar's RCRI/NSQIP calculation for MACE is: 2.2%  Review meds: , NO ACE-I/ARB day  of surgery. OK to do statin day of (esp if vascular sx) No Trulicity injection for at least 7 days prior to surgery.   Megan Lederer Newman Nip, FNP        Outpatient Encounter Medications as of 12/09/2022  Medication Sig   Accu-Chek Softclix Lancets lancets Use as instructed 3 times daily dx e11.65   albuterol (PROVENTIL) (2.5 MG/3ML) 0.083% nebulizer solution Take 3 mLs (2.5 mg total) by nebulization every 6 (six) hours as needed for wheezing or shortness of breath.   albuterol (VENTOLIN HFA) 108 (90 Base) MCG/ACT inhaler Inhale 2 puffs into the lungs every 6 (six) hours as needed for wheezing or shortness of breath.   Artificial Tear Ointment (DRY EYES OP) Place 1 drop into both eyes daily as needed (dry eyes).   azelastine (ASTELIN) 0.1 % nasal spray Place 1 spray into both nostrils 2 (two) times daily. Use in each nostril as directed   Blood Glucose Monitoring Suppl DEVI 1 each by Does not apply route in the morning, at noon, and at bedtime. May substitute to any manufacturer covered by patient's insurance.   CALCIUM-MAGNESIUM-VITAMIN D PO Take 1 tablet by mouth daily.   Charcoal Activated 260 MG CAPS Take 260 mg by mouth See admin instructions. Every 10 days   CINNAMON PO Take 1,000 mg  by mouth daily.   Coenzyme Q10 (COQ10) 200 MG CAPS Take 200 mg by mouth daily.   Cyanocobalamin (VITAMIN B-12 PO) Take 500 mcg by mouth daily.   Dulaglutide (TRULICITY) 3 MG/0.5ML SOPN Inject 3 mg as directed once a week.   Flaxseed, Linseed, (FLAXSEED OIL PO) Take 1,000 mg by mouth daily.   fluticasone (FLOVENT HFA) 110 MCG/ACT inhaler Inhale 1 puff into the lungs 2 (two) times daily as needed (shortness of breath).   gabapentin (NEURONTIN) 100 MG capsule TAKE 1 CAPSULE BY MOUTH THREE TIMES DAILY   Lancets (FREESTYLE) lancets Use as instructed   Lancets Misc. (ACCU-CHEK SOFTCLIX LANCET DEV) KIT Use for up to 3 times daily testing dx e11.65   Menthol-Methyl Salicylate (MUSCLE RUB) 10-15 % CREA Apply 1  application topically as needed for muscle pain.   Misc Natural Products (TART CHERRY ADVANCED PO) Take 1 capsule by mouth daily.   naproxen (NAPROSYN) 375 MG tablet Take 1 tablet (375 mg total) by mouth 2 (two) times daily as needed. Take with food to prevent GI upset   olmesartan-hydrochlorothiazide (BENICAR HCT) 20-12.5 MG tablet TAKE ONE TABLET BY MOUTH ONCE DAILY.   OVER THE COUNTER MEDICATION Take 1 capsule by mouth every evening. Doterra deep blue   Probiotic Product (PROBIOTIC DAILY PO) Take 1 each by mouth daily. Gummy   rosuvastatin (CRESTOR) 5 MG tablet Take 1 tablet (5 mg total) by mouth daily.   TURMERIC PO Take 1,000 mg by mouth daily.   [DISCONTINUED] TRULICITY 1.5 MG/0.5ML SOPN INJECT 1.5MG  INTO THE SKIN ONCE WEEKLY.   cyclobenzaprine (FLEXERIL) 5 MG tablet TAKE (1) TABLET BY MOUTH TWICE DAILY AS NEEDED. (Patient not taking: Reported on 09/17/2022)   montelukast (SINGULAIR) 10 MG tablet Take 1 tablet (10 mg total) by mouth at bedtime. (Patient not taking: Reported on 09/17/2022)   No facility-administered encounter medications on file as of 12/09/2022.    Past Surgical History:  Procedure Laterality Date   ABDOMINAL HYSTERECTOMY     partial   CARPAL TUNNEL RELEASE     bilat, [place]   CESAREAN SECTION     COLONOSCOPY  07/18/2010   Fields-SIMPLE ADENOMA (Next colonoscopy  06/2020)   COLONOSCOPY WITH PROPOFOL N/A 08/20/2020   Procedure: COLONOSCOPY WITH PROPOFOL;  Surgeon: Lanelle Bal, DO;  Location: [facility];  Service: Endoscopy;  Laterality: N/A;  9:00am    Review of Systems  Constitutional:  Negative for chills and fever.  HENT:  Negative for tinnitus.   Eyes:  Negative for blurred vision.  Respiratory:  Negative for shortness of breath.   Cardiovascular:  Negative for chest pain.  Gastrointestinal:  Negative for abdominal pain.  Genitourinary:  Negative for dysuria.  Musculoskeletal:  Positive for joint pain and myalgias.  Skin:  Negative for rash.   Neurological:  Negative for dizziness and headaches.  Psychiatric/Behavioral:  Negative for depression.       Objective    BP 128/73 (BP Location: Right Arm, Patient Position: Sitting, Cuff Size: Large)   Pulse 71   Ht 5\' 4"  (1.626 m)   Wt 196 lb (88.9 kg)   SpO2 96%   BMI 33.64 kg/m   Physical Exam Vitals reviewed.  Constitutional:      General: She is not in acute distress.    Appearance: Normal appearance. She is not ill-appearing, toxic-appearing or diaphoretic.  HENT:     Head: Normocephalic.     Right Ear: Tympanic membrane normal.     Left Ear: Tympanic membrane  normal.     Nose: Nose normal.     Mouth/Throat:     Mouth: Mucous membranes are moist.  Eyes:     General:        Right eye: No discharge.        Left eye: No discharge.     Conjunctiva/sclera: Conjunctivae normal.  Cardiovascular:     Rate and Rhythm: Normal rate.     Pulses: Normal pulses.     Heart sounds: Normal heart sounds.  Pulmonary:     Effort: Pulmonary effort is normal. No respiratory distress.     Breath sounds: Normal breath sounds.  Abdominal:     General: Bowel sounds are normal.     Palpations: Abdomen is soft.     Tenderness: There is no abdominal tenderness. There is no right CVA tenderness, left CVA tenderness or guarding.  Musculoskeletal:        General: Normal range of motion.     Cervical back: Normal range of motion.  Skin:    General: Skin is warm and dry.     Capillary Refill: Capillary refill takes less than 2 seconds.  Neurological:     General: No focal deficit present.     Mental Status: She is alert.     Gait: Gait abnormal.  Psychiatric:        Mood and Affect: Mood normal.        Behavior: Behavior normal.       Assessment & Plan:  Preoperative clearance Assessment & Plan: Ordered  EKG, CBC, BMP+eGFR , Hemoglobin A1c If Lab and EKG results return within normal range patient is optimal condition for surgery   Orders: -     CMP14+EGFR -     CBC with  Differential/Platelet -     Hemoglobin A1c -     EKG 12-Lead  Primary hypertension  Other orders -     Trulicity; Inject 3 mg as directed once a week.  Dispense: 2 mL; Refill: 0    Return in about 4 months (around 04/10/2023), or if symptoms worsen or fail to improve, for chronic follow-up.   Megan Lederer Newman Nip, FNP

## 2022-12-09 NOTE — Assessment & Plan Note (Addendum)
Ordered  EKG, CBC, BMP+eGFR , Hemoglobin A1c If Lab and EKG results return within normal range patient is optimal condition for surgery

## 2022-12-09 NOTE — Patient Instructions (Signed)

## 2022-12-10 LAB — CBC WITH DIFFERENTIAL/PLATELET
Basophils Absolute: 0.1 10*3/uL (ref 0.0–0.2)
Basos: 1 %
EOS (ABSOLUTE): 0.1 10*3/uL (ref 0.0–0.4)
Eos: 1 %
Hematocrit: 38.4 % (ref 34.0–46.6)
Hemoglobin: 12.4 g/dL (ref 11.1–15.9)
Immature Grans (Abs): 0 10*3/uL (ref 0.0–0.1)
Immature Granulocytes: 0 %
Lymphocytes Absolute: 2.6 10*3/uL (ref 0.7–3.1)
Lymphs: 30 %
MCH: 28.8 pg (ref 26.6–33.0)
MCHC: 32.3 g/dL (ref 31.5–35.7)
MCV: 89 fL (ref 79–97)
Monocytes Absolute: 0.6 10*3/uL (ref 0.1–0.9)
Monocytes: 7 %
Neutrophils Absolute: 5.3 10*3/uL (ref 1.4–7.0)
Neutrophils: 61 %
Platelets: 271 10*3/uL (ref 150–450)
RBC: 4.31 x10E6/uL (ref 3.77–5.28)
RDW: 12.9 % (ref 11.7–15.4)
WBC: 8.7 10*3/uL (ref 3.4–10.8)

## 2022-12-10 LAB — CMP14+EGFR
ALT: 10 IU/L (ref 0–32)
AST: 13 IU/L (ref 0–40)
Albumin: 4.3 g/dL (ref 3.9–4.9)
Alkaline Phosphatase: 77 IU/L (ref 44–121)
BUN/Creatinine Ratio: 25 (ref 12–28)
BUN: 23 mg/dL (ref 8–27)
Bilirubin Total: 0.7 mg/dL (ref 0.0–1.2)
CO2: 24 mmol/L (ref 20–29)
Calcium: 9.7 mg/dL (ref 8.7–10.3)
Chloride: 100 mmol/L (ref 96–106)
Creatinine, Ser: 0.91 mg/dL (ref 0.57–1.00)
Globulin, Total: 3.2 g/dL (ref 1.5–4.5)
Glucose: 100 mg/dL — ABNORMAL HIGH (ref 70–99)
Potassium: 4 mmol/L (ref 3.5–5.2)
Sodium: 139 mmol/L (ref 134–144)
Total Protein: 7.5 g/dL (ref 6.0–8.5)
eGFR: 70 mL/min/{1.73_m2} (ref >59)

## 2022-12-10 LAB — HEMOGLOBIN A1C
Est. average glucose Bld gHb Est-mCnc: 140 mg/dL
Hgb A1c MFr Bld: 6.5 % — ABNORMAL HIGH (ref 4.8–5.6)

## 2022-12-11 ENCOUNTER — Other Ambulatory Visit: Payer: Self-pay | Admitting: Family Medicine

## 2022-12-11 DIAGNOSIS — Z01818 Encounter for other preprocedural examination: Secondary | ICD-10-CM

## 2022-12-11 NOTE — Progress Notes (Signed)
Please inform patient EKG needs to be repeated due to inaccurate reading  Patient can come in for nurse visit for EKG

## 2022-12-13 ENCOUNTER — Other Ambulatory Visit: Payer: Self-pay | Admitting: Family Medicine

## 2022-12-14 ENCOUNTER — Telehealth: Payer: Self-pay | Admitting: Orthopedic Surgery

## 2022-12-14 NOTE — Telephone Encounter (Signed)
[name] pt - pt lvm stating that she is scheduled for a CT on 9/26, but she received papers from her insurance denying.  She would like a call back, she stated if she doesn't get a call she'll present to our facility and she really doesn't want to get out in the rain today.

## 2022-12-14 NOTE — Telephone Encounter (Signed)
Patient presented to the office, told her about the peer to peer, neither of know what that is.  She would like a call, but she will not be home until after 2pm today.

## 2022-12-15 ENCOUNTER — Ambulatory Visit: Payer: Medicaid Other | Admitting: Family Medicine

## 2022-12-17 ENCOUNTER — Ambulatory Visit (HOSPITAL_COMMUNITY)
Admission: RE | Admit: 2022-12-17 | Discharge: 2022-12-17 | Disposition: A | Discharge status: Home / Self Care | Payer: Medicaid Other | Source: Ambulatory Visit | Attending: Orthopedic Surgery | Admitting: Orthopedic Surgery

## 2022-12-17 DIAGNOSIS — M2540 Effusion, unspecified joint: Secondary | ICD-10-CM | POA: Diagnosis not present

## 2022-12-17 DIAGNOSIS — M19012 Primary osteoarthritis, left shoulder: Secondary | ICD-10-CM | POA: Diagnosis not present

## 2022-12-17 DIAGNOSIS — I700 Atherosclerosis of aorta: Secondary | ICD-10-CM | POA: Diagnosis not present

## 2022-12-17 DIAGNOSIS — I517 Cardiomegaly: Secondary | ICD-10-CM | POA: Diagnosis not present

## 2022-12-18 ENCOUNTER — Telehealth: Payer: Self-pay

## 2022-12-18 NOTE — Telephone Encounter (Signed)
April with [facility] called regarding surgical clearance and medications. She states she has received clearance form, but when she called patient she determined patient is on Trulicity. She states patient needs to be off Trulicity for at least 7 days prior to surgery. She is requesting verbal order approving the stop of Trulicity 7 days prior to surgery.   Please call April at [phone]. She states you can leave a voicemail if she does not answer as it is a secure voicemail even though it does not say her name.

## 2022-12-20 NOTE — Telephone Encounter (Signed)
Hello, I addendum my note and included Patient needs to be off Trulicity for at least 7 days prior to surgery. Please fax  updated records to April. Patient was seen on 12/09/2022.

## 2022-12-22 NOTE — Telephone Encounter (Signed)
Called April, left VM with verbal orders.

## 2022-12-26 ENCOUNTER — Other Ambulatory Visit: Payer: Self-pay | Admitting: Family Medicine

## 2022-12-27 ENCOUNTER — Other Ambulatory Visit: Payer: Self-pay | Admitting: Family Medicine

## 2022-12-27 DIAGNOSIS — I10 Essential (primary) hypertension: Secondary | ICD-10-CM

## 2022-12-28 ENCOUNTER — Telehealth: Payer: Self-pay | Admitting: Orthopedic Surgery

## 2022-12-28 ENCOUNTER — Telehealth: Payer: Self-pay | Admitting: Family Medicine

## 2022-12-28 NOTE — Telephone Encounter (Signed)
Patient called wants to know if she needs to take her Trulicity on 10/9  Patient is scheduled for surgery on 10/17 and is advised to stop all meds 1 week prior to appt and that is 10/10.  Patient wants a call back in regard

## 2022-12-28 NOTE — Telephone Encounter (Signed)
[name] pt - pt lvm regarding a bunch of supplements she takes, she has questions and wants to make sure they are ok with Dr. Loraine Leriche.  Please call her back at [phone]

## 2022-12-28 NOTE — Telephone Encounter (Signed)
Patient notified

## 2022-12-28 NOTE — Telephone Encounter (Signed)
Megan Salazar please let her know No Trulicity injection for at least 7 days prior to surgery.

## 2022-12-29 NOTE — Telephone Encounter (Signed)
Called and left message stating all information will be gone over at pre op and to make sure she has an accurate list of all supplements for appointment. Call back information given incase of further questions or concerns.

## 2022-12-30 NOTE — Patient Instructions (Signed)
Megan Salazar  12/30/2022     @PREFPERIOPPHARMACY @   Your procedure is scheduled on  01/07/2023.   Report to [facility] at  0600  A.M.   Call this number if you have problems the morning of surgery:  [phone]  If you experience any cold or flu symptoms such as cough, fever, chills, shortness of breath, etc. between now and your scheduled surgery, please notify us at the above number.   Remember:     Your last dose of trulicity should have been on 12/30/2022.       DO NOT take any medications for diabetes the morning of your procedure.    Do not eat after midnight.    You may drink clear liquids until 0330 am on 01/07/2023.    Clear liquids allowed are:                    Water, Black Coffee Only (No creamer, milk or cream, including half & half and powdered creamer), and Clear Sports drink (No red color; diabetics please choose diet or no sugar options)     Take these medicines the morning of surgery with A SIP OF WATER                                                gabapentin     Do not wear jewelry, make-up or nail polish, including gel polish,  artificial nails, or any other type of covering on natural nails (fingers and  toes).  Do not wear lotions, powders, or perfumes, or deodorant.  Do not shave 48 hours prior to surgery.  Men may shave face and neck.  Do not bring valuables to the hospital.  [facility] is not responsible for any belongings or valuables.  Contacts, dentures or bridgework may not be worn into surgery.  Leave your suitcase in the car.  After surgery it may be brought to your room.  For patients admitted to the hospital, discharge time will be determined by your treatment team.  Patients discharged the day of surgery will not be allowed to drive home and must have someone with them for 24 hours.   Special instructions:   DO NOT smoke tobacco or vape for 24 hours before your procedure.   Please read over the following fact  sheets that you were given. Pain Booklet, Coughing and Deep Breathing, Surgical Site Infection Prevention, Anesthesia Post-op Instructions, and Care and Recovery After Surgery        Shoulder Replacement, Care After This sheet gives you information about how to care for yourself after your procedure. Your health care provider may also give you more specific instructions. If you have problems or questions, contact your health care provider. What can I expect after the procedure? After the procedure, it is common to have: A bruised and stiff shoulder and arm. Some shoulder and arm pain. Follow these instructions at home: Medicines Take over-the-counter and prescription medicines only as told by your health care provider. If you were prescribed an antibiotic medicine, use it as told by your health care provider. Do not stop using the antibiotic even if you start to feel better. Ask your health care provider if the medicine prescribed to you can cause constipation. You may need to take these actions to prevent or treat constipation:  Drink enough fluid to keep your urine pale yellow. Take over-the-counter or prescription medicines. Eat foods that are high in fiber, such as beans, whole grains, and fresh fruits and vegetables. Limit foods that are high in fat and processed sugars, such as fried or sweet foods. If you have a sling or immobilizer:  Wear the sling or immobilizer as told by your health care provider. Remove it only as told by your health care provider. Loosen the sling or immobilizer if your fingers tingle, become numb, or turn cold and blue. Keep the sling or immobilizer clean and dry. Bathing Do not take baths, swim, or use a hot tub until your health care provider approves. Ask your health care provider if you can take showers. You may only be allowed to take sponge baths. If your sling or immobilizer is not waterproof: Do not let it get wet. Cover it with a watertight  covering when you take a bath or a shower. Keep the bandage (dressing) dry until your health care provider says it can be removed. Incision care  Follow instructions from your health care provider about how to take care of your incision. Make sure you: Wash your hands with soap and water for at least 20 seconds before and after you change your dressing. If soap and water are not available, use hand sanitizer. Change your dressing as told by your health care provider. Leave staples, stitches (sutures), skin glue, or adhesive strips in place. These skin closures may need to stay in place for 2 weeks or longer. If adhesive strip edges start to loosen and curl up, you may trim the loose edges. Do not remove adhesive strips completely unless your health care provider tells you to do that. If you have a tube to remove drainage, follow instructions from your health care provider about caring for it. Do not remove the drain tube or any dressings around the tube opening unless your health care provider approves. Check your incision area every day for signs of infection. Check for: More redness, swelling, or pain. More fluid or blood. Warmth. Pus or a bad smell. Managing pain, stiffness, and swelling  If directed, put ice on the affected area. To do this: If you have a removable sling, remove it as told by your health care provider. Put ice in a plastic bag. Place a towel between your skin and the bag. Leave the ice on for 20 minutes, 2-3 times a day. Remove the ice if your skin turns bright red. This is very important. If you cannot feel pain, heat, or cold, you have a greater risk of damage to the area. If you have an icing device, use it as directed by your health care provider. Move your fingers and elbow often to reduce stiffness and swelling. Activity Do not use your arm to push yourself up in bed or from a chair. Follow lifting restrictions as told: Do not lift anything that is heavier than a  cup of coffee for the first 6 weeks after surgery, or as told by your health care provider. Do not lift anything that is heavier than 10 lb (4.5 kg), or the limit that you are told, for 6 months or until your health care provider says that it is safe. Do exercises, including physical therapy, as told by your health care provider. Try not to overuse your shoulder. This includes repetitive pushing or pulling. Early overuse of the shoulder may result in later problems. (Overusing the shoulder is easy to  do when your pain goes away for the first time.) Avoid overstretching your arm for 6 weeks after surgery, or as told by your health care provider. Avoid sitting for a long time without moving. Get up to take short walks every 1-2 hours. This is important to improve blood flow and breathing. Ask for help if you feel weak or unsteady. Ask for help with some activities. Your health care provider may be able to suggest a clinic or agency for this if you do not have home support. Do not participate in contact sports. Driving Ask your health care provider if the medicine prescribed to you requires you to avoid driving or using machinery. Do not drive for 2-4 weeks after surgery or as told by your health care provider. General instructions Tell your health care provider if you plan to have dental work. Also: Tell your dentist about your joint replacement. Ask your health care provider if there are any special instructions you need to follow before having dental care and routine cleanings. Do not use any products that contain nicotine or tobacco, such as cigarettes, e-cigarettes, and chewing tobacco. These can delay healing. If you need help quitting, ask your health care provider. Keep all follow-up visits. This is important. Contact a health care provider if: You develop a rash. You have a fever. You have any of these signs of infection in your incision area: More redness, swelling, or pain. More fluid  or blood. Warmth. Pus or a bad smell. Get help right away if: The edges of the incision site break open after sutures have been removed. You have redness, swelling, pain, or warmth in your leg or arm. You have chest pain or shortness of breath. These symptoms may represent a serious problem that is an emergency. Do not wait to see if the symptoms will go away. Get medical help right away. Call your local emergency services (911 in the [place] Do not drive yourself to the hospital. Summary It is common to have pain and stiffness in your shoulder and arm after the procedure. Put ice on the affected area and take pain medicine as told by your health care provider. Do not use your arm to push yourself up in bed or from a chair. Do exercises, including physical therapy, as told by your health care provider. Check your incision area daily. Call your health care provider if you see signs of infection. This information is not intended to replace advice given to you by your health care provider. Make sure you discuss any questions you have with your health care provider. Document Revised: 08/23/2019 Document Reviewed: 08/23/2019 Elsevier Patient Education  2024 [employer]  Reverse Total Shoulder Replacement, Care After This sheet gives you information about how to care for yourself after your procedure. Your health care provider may also give you more specific instructions. If you have problems or questions, contact your health care provider. What can I expect after the procedure? After the procedure, it is common to have: Pain in the shoulder and arm. Stiffness in the shoulder and arm. Follow these instructions at home: Medicines Take over-the-counter and prescription medicines only as told by your health care provider. Ask your health care provider if the medicine prescribed to you: Requires you to avoid driving or using machinery. Can cause constipation. You may need to take these actions to  prevent or treat constipation: Drink enough fluid to keep your urine pale yellow. Take over-the-counter or prescription medicines. Eat foods that are high in  fiber, such as beans, whole grains, and fresh fruits and vegetables. Limit foods that are high in fat and processed sugars, such as fried or sweet foods. If you have a sling: Wear the sling as told by your health care provider. Remove it only as told by your health care provider. Check the skin around your sling every day. Tell your health care provider about any concerns. Loosen the sling if your fingers tingle, become numb, or turn cold and blue. Keep the sling clean and dry. Bathing Do not take baths, swim, or use a hot tub until your health care provider approves. Ask your health care provider if you may take showers. You may only be allowed to take sponge baths. If the sling is not waterproof: Do not let it get wet. Cover it with a watertight covering when you take a bath or shower. Keep your bandage (dressing) dry until your health care provider says it can be removed. Incision care  Follow instructions from your health care provider about how to take care of your incision. Make sure you: Wash your hands with soap and water for at least 20 seconds before and after you change your dressing. If soap and water are not available, use hand sanitizer. Change your dressing as told by your health care provider. Leave stitches (sutures), skin glue, or adhesive strips in place. These skin closures may need to stay in place for 2 weeks or longer. If adhesive strip edges start to loosen and curl up, you may trim the loose edges. Do not remove adhesive strips completely unless your health care provider tells you to do that. Check your incision area every day for signs of infection. Check for: More redness, swelling, or pain. Fluid or blood. Warmth. Pus or a bad smell. Managing pain, stiffness, and swelling  If directed, put ice on your  shoulder. To do this: If you have a removable sling, remove it as told by your health care provider. Put ice in a plastic bag. Place a towel between your skin and the bag. Leave the ice on for 20 minutes, 2-3 times a day. Remove the ice if your skin turns bright red. This is very important. If you cannot feel pain, heat, or cold, you have a greater risk of damage to the area. Move your fingers and hand often to reduce stiffness and swelling. Driving If you were given a sedative during the procedure, it can affect you for several hours. Do not drive or operate machinery until your health care provider says that it is safe. Ask your health care provider when it is safe to drive if you have a sling on your arm. Activity Return to your normal activities as told by your health care provider. Ask your health care provider what activities are safe for you. Do shoulder exercises as told by your health care provider. Do not lift your arm above shoulder level until your health care provider approves. Do not make large movements with your arm. Do not push or pull things until your health care provider approves. Do not lift anything that is heavier than 5 lb (2.3 kg) until your health care provider says that it is safe. General instructions Do not use any products that contain nicotine or tobacco, such as cigarettes, e-cigarettes, and chewing tobacco. These can delay healing after surgery. If you need help quitting, ask your health care provider. Tell your health care provider if you plan to have dental work. Also: Tell your dentist about  your joint replacement. Ask your health care provider if there are any special instructions you need to follow before having dental care and routine cleanings. Keep all follow-up visits. This is important. Contact a health care provider if: You feel nauseous or you vomit. Your arm tingles or feels numb. Your pain gets worse, even after taking pain medicine. You have  any of these signs of infection: More redness, swelling, or pain around your incision. Fluid or blood coming from your incision. Warmth coming from your incision. Pus or a bad smell coming from your incision. A fever. Get help right away if: Your shoulder joint moves out of place. Your incision comes apart. You have redness, swelling, pain, or warmth in your leg or arm. You have chest pain or shortness of breath. These symptoms may represent a serious problem that is an emergency. Do not wait to see if the symptoms will go away. Get medical help right away. Call your local emergency services (911 in the [place] Do not drive yourself to the hospital. Summary After the procedure, it is common to have some pain and stiffness. Take over-the-counter and prescription medicines only as told by your health care provider. Keep your bandage (dressing) dry until your health care provider says it can be removed. Know the symptoms that should prompt you to contact your health care provider. Do shoulder exercises as told by your health care provider. Ask your health care provider what activities are safe for you. This information is not intended to replace advice given to you by your health care provider. Make sure you discuss any questions you have with your health care provider. Document Revised: 08/23/2019 Document Reviewed: 08/23/2019 Elsevier Patient Education  2024 [employer]. General Anesthesia, Adult, Care After The following information offers guidance on how to care for yourself after your procedure. Your health care provider may also give you more specific instructions. If you have problems or questions, contact your health care provider. What can I expect after the procedure? After the procedure, it is common for people to: Have pain or discomfort at the IV site. Have nausea or vomiting. Have a sore throat or hoarseness. Have trouble concentrating. Feel cold or chills. Feel weak,  sleepy, or tired (fatigue). Have soreness and body aches. These can affect parts of the body that were not involved in surgery. Follow these instructions at home: For the time period you were told by your health care provider:  Rest. Do not participate in activities where you could fall or become injured. Do not drive or use machinery. Do not drink alcohol. Do not take sleeping pills or medicines that cause drowsiness. Do not make important decisions or sign legal documents. Do not take care of children on your own. General instructions Drink enough fluid to keep your urine pale yellow. If you have sleep apnea, surgery and certain medicines can increase your risk for breathing problems. Follow instructions from your health care provider about wearing your sleep device: Anytime you are sleeping, including during daytime naps. While taking prescription pain medicines, sleeping medicines, or medicines that make you drowsy. Return to your normal activities as told by your health care provider. Ask your health care provider what activities are safe for you. Take over-the-counter and prescription medicines only as told by your health care provider. Do not use any products that contain nicotine or tobacco. These products include cigarettes, chewing tobacco, and vaping devices, such as e-cigarettes. These can delay incision healing after surgery. If you need help  quitting, ask your health care provider. Contact a health care provider if: You have nausea or vomiting that does not get better with medicine. You vomit every time you eat or drink. You have pain that does not get better with medicine. You cannot urinate or have bloody urine. You develop a skin rash. You have a fever. Get help right away if: You have trouble breathing. You have chest pain. You vomit blood. These symptoms may be an emergency. Get help right away. Call 911. Do not wait to see if the symptoms will go away. Do not  drive yourself to the hospital. Summary After the procedure, it is common to have a sore throat, hoarseness, nausea, vomiting, or to feel weak, sleepy, or fatigue. For the time period you were told by your health care provider, do not drive or use machinery. Get help right away if you have difficulty breathing, have chest pain, or vomit blood. These symptoms may be an emergency. This information is not intended to replace advice given to you by your health care provider. Make sure you discuss any questions you have with your health care provider. Document Revised: 06/06/2021 Document Reviewed: 06/06/2021 Elsevier Patient Education  2024 [employer].

## 2022-12-31 ENCOUNTER — Encounter (HOSPITAL_COMMUNITY)
Admission: RE | Admit: 2022-12-31 | Discharge: 2022-12-31 | Disposition: A | Discharge status: Home / Self Care | Payer: Medicaid Other | Source: Ambulatory Visit | Attending: Orthopedic Surgery | Admitting: Orthopedic Surgery

## 2022-12-31 VITALS — BP 104/58 | HR 72 | Temp 97.7°F | Resp 18 | Ht 64.0 in | Wt 196.0 lb

## 2022-12-31 DIAGNOSIS — Z01812 Encounter for preprocedural laboratory examination: Secondary | ICD-10-CM | POA: Insufficient documentation

## 2022-12-31 DIAGNOSIS — Z01818 Encounter for other preprocedural examination: Secondary | ICD-10-CM

## 2022-12-31 DIAGNOSIS — K7469 Other cirrhosis of liver: Secondary | ICD-10-CM | POA: Diagnosis not present

## 2022-12-31 LAB — HEPATIC FUNCTION PANEL
ALT: 11 U/L (ref 0–44)
AST: 18 U/L (ref 15–41)
Albumin: 3.8 g/dL (ref 3.5–5.0)
Alkaline Phosphatase: 62 U/L (ref 38–126)
Bilirubin, Direct: 0.2 mg/dL (ref 0.0–0.2)
Indirect Bilirubin: 1 mg/dL — ABNORMAL HIGH (ref 0.3–0.9)
Total Bilirubin: 1.2 mg/dL (ref 0.3–1.2)
Total Protein: 7.2 g/dL (ref 6.5–8.1)

## 2022-12-31 LAB — BASIC METABOLIC PANEL
Anion gap: 7 (ref 5–15)
BUN: 29 mg/dL — ABNORMAL HIGH (ref 8–23)
CO2: 27 mmol/L (ref 22–32)
Calcium: 8.9 mg/dL (ref 8.9–10.3)
Chloride: 103 mmol/L (ref 98–111)
Creatinine, Ser: 1 mg/dL (ref 0.44–1.00)
GFR, Estimated: >60 mL/min (ref >60)
Glucose, Bld: 118 mg/dL — ABNORMAL HIGH (ref 70–99)
Potassium: 3.8 mmol/L (ref 3.5–5.1)
Sodium: 137 mmol/L (ref 135–145)

## 2022-12-31 LAB — CBC
HCT: 37.4 % (ref 36.0–46.0)
Hemoglobin: 11.9 g/dL — ABNORMAL LOW (ref 12.0–15.0)
MCH: 29.1 pg (ref 26.0–34.0)
MCHC: 31.8 g/dL (ref 30.0–36.0)
MCV: 91.4 fL (ref 80.0–100.0)
Platelets: 202 10*3/uL (ref 150–400)
RBC: 4.09 MIL/uL (ref 3.87–5.11)
RDW: 14.3 % (ref 11.5–15.5)
WBC: 7 10*3/uL (ref 4.0–10.5)
nRBC: 0 % (ref 0.0–0.2)

## 2022-12-31 LAB — SURGICAL PCR SCREEN
MRSA, PCR: NEGATIVE
Staphylococcus aureus: NEGATIVE

## 2022-12-31 LAB — PROTIME-INR
INR: 1 (ref 0.8–1.2)
Prothrombin Time: 13.3 s (ref 11.4–15.2)

## 2023-01-04 ENCOUNTER — Telehealth: Payer: Self-pay | Admitting: Orthopedic Surgery

## 2023-01-04 ENCOUNTER — Telehealth: Payer: Self-pay

## 2023-01-04 NOTE — Telephone Encounter (Addendum)
I called her  She voiced understanding about soap and the dental appointment.  She asked about drinking sprite instead of the liquid they told her to drink at her preop appointment since it is clear  I gave her the number to pre op told her to ask them I am not sure  She has other questions about what they told her at preop directed her there  To you FYI

## 2023-01-04 NOTE — Telephone Encounter (Signed)
Patient left vm message on 01/03/23 needing to know about having her teeth cleaned on the 16th and wants to know if that is ok to have done before the day of her surgery.  Also she is scheduled for surgery on the 17th what time does she need to be there if her surgery is at 7:30 am.  Please advise

## 2023-01-04 NOTE — Telephone Encounter (Signed)
[name] pt - pt lvm stating she missed a call.  She stated that she can't wash her body w/the stuff she's supposed to because she's allergic to something in it.  She would like a call back shortly, she stated she was hoping in the shower.

## 2023-01-04 NOTE — Telephone Encounter (Signed)
When she has her preop they will tell her about the surgery arrival time  To Dr C I think ok for dental cleaning before her surgery but will defer to him

## 2023-01-05 ENCOUNTER — Telehealth: Payer: Self-pay | Admitting: Orthopedic Surgery

## 2023-01-05 NOTE — Telephone Encounter (Signed)
[name] pt - pt lvm stating she was calling regarding in home health services, [facility] through her [facility] card.  They told her we would have to fill out paperwork and mark it Urgent since the surgery is on Thursday.  She would like a call back.  [phone]

## 2023-01-05 NOTE — Telephone Encounter (Signed)
[name] pt - spoke w/the patient, she stated she is having surgery 10/17 and will be d/c 10/18.  She is wanting to know if they'll be sending a CNA home w/her because they don't want her to be alone for the first 24hrs and she doesn't have anyone to stay w/her.

## 2023-01-05 NOTE — Telephone Encounter (Signed)
Spoke with pt and let her know that's not something that's provided from our office. Let her know that home health is ordered for her beginning stages of therapy but not services for someone to go home with her to provide hygiene care and assist with ADL's. Advised pt to call her insurance company to see if that would be services they will cover and if so an order can be placed.

## 2023-01-05 NOTE — Telephone Encounter (Signed)
Spoke with pt to see if she's been able to call to see if Sprite is ok for her to drink before surgery since she's a diabetic. Pt states she will call once she gets off the phone to verify and that she already has the #. Let pt know if she has any other questions feel free to call back.

## 2023-01-06 ENCOUNTER — Other Ambulatory Visit (INDEPENDENT_AMBULATORY_CARE_PROVIDER_SITE_OTHER): Payer: Medicaid Other | Admitting: Orthopedic Surgery

## 2023-01-06 DIAGNOSIS — M19012 Primary osteoarthritis, left shoulder: Secondary | ICD-10-CM

## 2023-01-06 NOTE — Telephone Encounter (Signed)
Patient has called again in regards to this message.  Please call and advise [phone].

## 2023-01-07 ENCOUNTER — Encounter (HOSPITAL_COMMUNITY)
Admission: RE | Disposition: A | Discharge status: Home / Self Care | Payer: Self-pay | Source: Home / Self Care | Attending: Orthopedic Surgery

## 2023-01-07 ENCOUNTER — Ambulatory Visit (HOSPITAL_COMMUNITY): Payer: Medicaid Other | Admitting: Certified Registered"

## 2023-01-07 ENCOUNTER — Observation Stay (HOSPITAL_COMMUNITY)
Admission: RE | Admit: 2023-01-07 | Discharge: 2023-01-08 | Disposition: A | Discharge status: Home / Self Care | Payer: Medicaid Other | Attending: Orthopedic Surgery | Admitting: Orthopedic Surgery

## 2023-01-07 ENCOUNTER — Encounter (HOSPITAL_COMMUNITY): Payer: Self-pay | Admitting: Orthopedic Surgery

## 2023-01-07 ENCOUNTER — Ambulatory Visit (HOSPITAL_BASED_OUTPATIENT_CLINIC_OR_DEPARTMENT_OTHER): Payer: Medicaid Other | Admitting: Certified Registered"

## 2023-01-07 ENCOUNTER — Other Ambulatory Visit: Payer: Self-pay

## 2023-01-07 ENCOUNTER — Observation Stay (HOSPITAL_COMMUNITY): Payer: Medicaid Other

## 2023-01-07 DIAGNOSIS — Z01818 Encounter for other preprocedural examination: Principal | ICD-10-CM

## 2023-01-07 DIAGNOSIS — I10 Essential (primary) hypertension: Secondary | ICD-10-CM | POA: Diagnosis not present

## 2023-01-07 DIAGNOSIS — E119 Type 2 diabetes mellitus without complications: Secondary | ICD-10-CM | POA: Diagnosis not present

## 2023-01-07 DIAGNOSIS — M19012 Primary osteoarthritis, left shoulder: Secondary | ICD-10-CM | POA: Diagnosis not present

## 2023-01-07 DIAGNOSIS — J45909 Unspecified asthma, uncomplicated: Secondary | ICD-10-CM | POA: Diagnosis not present

## 2023-01-07 HISTORY — PX: TOTAL SHOULDER ARTHROPLASTY: SHX126

## 2023-01-07 LAB — GLUCOSE, CAPILLARY
Glucose-Capillary: 80 mg/dL (ref 70–99)
Glucose-Capillary: 147 mg/dL — ABNORMAL HIGH (ref 70–99)
Glucose-Capillary: 143 mg/dL — ABNORMAL HIGH (ref 70–99)
Glucose-Capillary: 160 mg/dL — ABNORMAL HIGH (ref 70–99)

## 2023-01-07 LAB — HEMOGLOBIN AND HEMATOCRIT, BLOOD
HCT: 36 % (ref 36.0–46.0)
Hemoglobin: 11.3 g/dL — ABNORMAL LOW (ref 12.0–15.0)

## 2023-01-07 SURGERY — ARTHROPLASTY, SHOULDER, TOTAL
Anesthesia: General | Site: Shoulder | Laterality: Left

## 2023-01-07 MED ORDER — CEFAZOLIN SODIUM-DEXTROSE 2-4 GM/100ML-% IV SOLN
2.0000 g | INTRAVENOUS | Status: AC
  Administered 2023-01-07: 2 g via INTRAVENOUS

## 2023-01-07 MED ORDER — ONDANSETRON HCL 4 MG/2ML IJ SOLN: 4.0000 mg | Freq: Four times a day (QID) | INTRAMUSCULAR | Status: DC | PRN

## 2023-01-07 MED ORDER — ONDANSETRON HCL 4 MG/2ML IJ SOLN: 4.0000 mg | Freq: Once | INTRAMUSCULAR | Status: DC | PRN

## 2023-01-07 MED ORDER — OXYCODONE HCL 5 MG/5ML PO SOLN: 5.0000 mg | Freq: Once | ORAL | Status: DC | PRN

## 2023-01-07 MED ORDER — CEFAZOLIN SODIUM-DEXTROSE 2-4 GM/100ML-% IV SOLN
INTRAVENOUS | Status: AC
  Filled 2023-01-07: qty 100

## 2023-01-07 MED ORDER — FENTANYL CITRATE (PF) 250 MCG/5ML IJ SOLN
INTRAMUSCULAR | Status: AC
  Filled 2023-01-07: qty 5

## 2023-01-07 MED ORDER — MONTELUKAST SODIUM 10 MG PO TABS
10.0000 mg | ORAL_TABLET | ORAL | Status: AC
  Administered 2023-01-07: 10 mg via ORAL
  Filled 2023-01-07: qty 1

## 2023-01-07 MED ORDER — FENTANYL CITRATE PF 50 MCG/ML IJ SOSY
25.0000 ug | PREFILLED_SYRINGE | INTRAMUSCULAR | Status: DC | PRN
  Administered 2023-01-07 (×3): 50 ug via INTRAVENOUS
  Filled 2023-01-07 (×3): qty 1

## 2023-01-07 MED ORDER — FENTANYL CITRATE (PF) 250 MCG/5ML IJ SOLN
INTRAMUSCULAR | Status: DC | PRN
  Administered 2023-01-07: 50 ug via INTRAVENOUS
  Administered 2023-01-07: 100 ug via INTRAVENOUS

## 2023-01-07 MED ORDER — ORAL CARE MOUTH RINSE: 15.0000 mL | Freq: Once | OROMUCOSAL | Status: AC

## 2023-01-07 MED ORDER — ROSUVASTATIN CALCIUM 10 MG PO TABS
5.0000 mg | ORAL_TABLET | Freq: Every day | ORAL | Status: DC
  Administered 2023-01-07 – 2023-01-08 (×2): 5 mg via ORAL
  Filled 2023-01-07 (×2): qty 1

## 2023-01-07 MED ORDER — TRANEXAMIC ACID-NACL 1000-0.7 MG/100ML-% IV SOLN
INTRAVENOUS | Status: AC
  Filled 2023-01-07: qty 100

## 2023-01-07 MED ORDER — OXYCODONE HCL 5 MG PO TABS
5.0000 mg | ORAL_TABLET | ORAL | Status: DC | PRN
  Administered 2023-01-07 – 2023-01-08 (×3): 5 mg via ORAL
  Filled 2023-01-07 (×3): qty 1

## 2023-01-07 MED ORDER — ROCURONIUM BROMIDE 10 MG/ML (PF) SYRINGE
PREFILLED_SYRINGE | INTRAVENOUS | Status: DC | PRN
  Administered 2023-01-07: 50 mg via INTRAVENOUS

## 2023-01-07 MED ORDER — LIDOCAINE HCL (CARDIAC) PF 100 MG/5ML IV SOSY
PREFILLED_SYRINGE | INTRAVENOUS | Status: DC | PRN
  Administered 2023-01-07: 100 mg via INTRAVENOUS

## 2023-01-07 MED ORDER — STERILE WATER FOR IRRIGATION IR SOLN
Status: DC | PRN
  Administered 2023-01-07: 1000 mL

## 2023-01-07 MED ORDER — ONDANSETRON HCL 4 MG PO TABS: 4.0000 mg | ORAL_TABLET | Freq: Four times a day (QID) | ORAL | Status: DC | PRN

## 2023-01-07 MED ORDER — OXYCODONE HCL 5 MG PO TABS: 5.0000 mg | ORAL_TABLET | Freq: Once | ORAL | Status: DC | PRN

## 2023-01-07 MED ORDER — KETAMINE HCL 50 MG/5ML IJ SOSY
PREFILLED_SYRINGE | INTRAMUSCULAR | Status: AC
  Filled 2023-01-07: qty 5

## 2023-01-07 MED ORDER — LIDOCAINE HCL (PF) 2 % IJ SOLN
INTRAMUSCULAR | Status: AC
  Filled 2023-01-07: qty 5

## 2023-01-07 MED ORDER — MIDAZOLAM HCL 2 MG/2ML IJ SOLN
INTRAMUSCULAR | Status: DC | PRN
  Administered 2023-01-07: 2 mg via INTRAVENOUS

## 2023-01-07 MED ORDER — HYDROCHLOROTHIAZIDE 12.5 MG PO TABS
12.5000 mg | ORAL_TABLET | Freq: Every day | ORAL | Status: DC
  Administered 2023-01-07 – 2023-01-08 (×2): 12.5 mg via ORAL
  Filled 2023-01-07 (×3): qty 1

## 2023-01-07 MED ORDER — GLYCOPYRROLATE PF 0.2 MG/ML IJ SOSY
PREFILLED_SYRINGE | INTRAMUSCULAR | Status: AC
  Filled 2023-01-07: qty 1

## 2023-01-07 MED ORDER — OXYCODONE HCL 5 MG PO TABS: 10.0000 mg | ORAL_TABLET | ORAL | Status: DC | PRN

## 2023-01-07 MED ORDER — TRANEXAMIC ACID-NACL 1000-0.7 MG/100ML-% IV SOLN
1000.0000 mg | INTRAVENOUS | Status: AC
  Administered 2023-01-07: 1000 mg via INTRAVENOUS

## 2023-01-07 MED ORDER — DEXAMETHASONE SODIUM PHOSPHATE 10 MG/ML IJ SOLN
INTRAMUSCULAR | Status: AC
  Filled 2023-01-07: qty 1

## 2023-01-07 MED ORDER — ONDANSETRON HCL 4 MG/2ML IJ SOLN
INTRAMUSCULAR | Status: AC
  Filled 2023-01-07: qty 2

## 2023-01-07 MED ORDER — VANCOMYCIN HCL 1000 MG IV SOLR
INTRAVENOUS | Status: AC
  Filled 2023-01-07: qty 20

## 2023-01-07 MED ORDER — PROPOFOL 10 MG/ML IV BOLUS
INTRAVENOUS | Status: AC
  Filled 2023-01-07: qty 20

## 2023-01-07 MED ORDER — PHENYLEPHRINE HCL (PRESSORS) 10 MG/ML IV SOLN
INTRAVENOUS | Status: DC | PRN
  Administered 2023-01-07: 160 ug via INTRAVENOUS
  Administered 2023-01-07: 80 ug via INTRAVENOUS

## 2023-01-07 MED ORDER — KETAMINE HCL 10 MG/ML IJ SOLN
INTRAMUSCULAR | Status: DC | PRN
  Administered 2023-01-07: 50 mg via INTRAVENOUS

## 2023-01-07 MED ORDER — DIPHENHYDRAMINE HCL 12.5 MG/5ML PO ELIX: 12.5000 mg | ORAL_SOLUTION | ORAL | Status: DC | PRN

## 2023-01-07 MED ORDER — ASPIRIN 81 MG PO CHEW
81.0000 mg | CHEWABLE_TABLET | Freq: Every day | ORAL | Status: DC
  Administered 2023-01-08: 81 mg via ORAL
  Filled 2023-01-07: qty 1

## 2023-01-07 MED ORDER — MIDAZOLAM HCL 2 MG/2ML IJ SOLN
INTRAMUSCULAR | Status: AC
  Filled 2023-01-07: qty 2

## 2023-01-07 MED ORDER — OLMESARTAN MEDOXOMIL-HCTZ 20-12.5 MG PO TABS: 1.0000 | ORAL_TABLET | Freq: Every day | ORAL | Status: DC

## 2023-01-07 MED ORDER — IRBESARTAN 150 MG PO TABS
150.0000 mg | ORAL_TABLET | Freq: Every day | ORAL | Status: DC
  Administered 2023-01-07 – 2023-01-08 (×2): 150 mg via ORAL
  Filled 2023-01-07 (×3): qty 1

## 2023-01-07 MED ORDER — LACTATED RINGERS IV SOLN: INTRAVENOUS | Status: DC

## 2023-01-07 MED ORDER — SUGAMMADEX SODIUM 200 MG/2ML IV SOLN
INTRAVENOUS | Status: DC | PRN
  Administered 2023-01-07: 200 mg via INTRAVENOUS

## 2023-01-07 MED ORDER — GLYCOPYRROLATE PF 0.2 MG/ML IJ SOSY
PREFILLED_SYRINGE | INTRAMUSCULAR | Status: DC | PRN
  Administered 2023-01-07: .2 mg via INTRAVENOUS

## 2023-01-07 MED ORDER — PHENYLEPHRINE HCL-NACL 20-0.9 MG/250ML-% IV SOLN
INTRAVENOUS | Status: DC | PRN
Start: 2023-01-07 — End: 2023-01-07
  Administered 2023-01-07: 25 ug/min via INTRAVENOUS

## 2023-01-07 MED ORDER — MORPHINE SULFATE (PF) 2 MG/ML IV SOLN: 0.5000 mg | INTRAVENOUS | Status: DC | PRN

## 2023-01-07 MED ORDER — DEXAMETHASONE SODIUM PHOSPHATE 10 MG/ML IJ SOLN
INTRAMUSCULAR | Status: DC | PRN
  Administered 2023-01-07: 10 mg via INTRAVENOUS

## 2023-01-07 MED ORDER — ROCURONIUM BROMIDE 10 MG/ML (PF) SYRINGE
PREFILLED_SYRINGE | INTRAVENOUS | Status: AC
  Filled 2023-01-07: qty 10

## 2023-01-07 MED ORDER — SODIUM CHLORIDE 0.9 % IR SOLN
Status: DC | PRN
  Administered 2023-01-07: 3000 mL
  Administered 2023-01-07: 1000 mL

## 2023-01-07 MED ORDER — PROPOFOL 10 MG/ML IV BOLUS
INTRAVENOUS | Status: DC | PRN
  Administered 2023-01-07: 100 mg via INTRAVENOUS

## 2023-01-07 MED ORDER — EPHEDRINE 5 MG/ML INJ
INTRAVENOUS | Status: AC
  Filled 2023-01-07: qty 5

## 2023-01-07 MED ORDER — ROPIVACAINE HCL 5 MG/ML IJ SOLN
INTRAMUSCULAR | Status: AC
  Filled 2023-01-07: qty 30

## 2023-01-07 MED ORDER — CHLORHEXIDINE GLUCONATE 0.12 % MT SOLN
15.0000 mL | Freq: Once | OROMUCOSAL | Status: AC
  Administered 2023-01-07: 15 mL via OROMUCOSAL

## 2023-01-07 MED ORDER — ONDANSETRON HCL 4 MG/2ML IJ SOLN
INTRAMUSCULAR | Status: DC | PRN
  Administered 2023-01-07: 4 mg via INTRAVENOUS

## 2023-01-07 MED ORDER — CHLORHEXIDINE GLUCONATE 0.12 % MT SOLN
OROMUCOSAL | Status: AC
  Filled 2023-01-07: qty 15

## 2023-01-07 MED ORDER — EPHEDRINE SULFATE-NACL 50-0.9 MG/10ML-% IV SOSY
PREFILLED_SYRINGE | INTRAVENOUS | Status: DC | PRN
Start: 2023-01-07 — End: 2023-01-07
  Administered 2023-01-07: 5 mg via INTRAVENOUS
  Administered 2023-01-07: 10 mg via INTRAVENOUS

## 2023-01-07 MED ORDER — PHENYLEPHRINE 80 MCG/ML (10ML) SYRINGE FOR IV PUSH (FOR BLOOD PRESSURE SUPPORT)
PREFILLED_SYRINGE | INTRAVENOUS | Status: AC
  Filled 2023-01-07: qty 10

## 2023-01-07 MED ORDER — BUPIVACAINE-EPINEPHRINE (PF) 0.5% -1:200000 IJ SOLN
INTRAMUSCULAR | Status: AC
  Filled 2023-01-07: qty 30

## 2023-01-07 MED ORDER — VANCOMYCIN HCL 1000 MG IV SOLR
INTRAVENOUS | Status: DC | PRN
  Administered 2023-01-07: 1000 mg

## 2023-01-07 MED ORDER — ACETAMINOPHEN 500 MG PO TABS
1000.0000 mg | ORAL_TABLET | Freq: Three times a day (TID) | ORAL | Status: DC
  Administered 2023-01-07 – 2023-01-08 (×3): 1000 mg via ORAL
  Filled 2023-01-07 (×3): qty 2

## 2023-01-07 MED ORDER — MIDAZOLAM HCL 2 MG/2ML IJ SOLN
INTRAMUSCULAR | Status: AC
  Administered 2023-01-07: 2 mg
  Filled 2023-01-07: qty 2

## 2023-01-07 SURGICAL SUPPLY — 67 items
APL PRP STRL LF DISP 70% ISPRP (MISCELLANEOUS) ×2
BLADE SAW SGTL 83.5X18.5 (BLADE) ×1 IMPLANT
BNDG GAUZE ELAST 4 BULKY (GAUZE/BANDAGES/DRESSINGS) ×2 IMPLANT
BODY TRUNION ECLIPSE 41 SL (Shoulder) IMPLANT
BOWL SMART MIX CTS (DISPOSABLE) IMPLANT
CALIBRATOR GLENOID VIP 5-D (SYSTAGENIX WOUND MANAGEMENT) IMPLANT
CEMENT HV SMART SET (Cement) ×2 IMPLANT
CHLORAPREP W/TINT 26 (MISCELLANEOUS) ×2 IMPLANT
CLOTH BEACON ORANGE TIMEOUT ST (SAFETY) ×1 IMPLANT
COOLER ICEMAN CLASSIC (MISCELLANEOUS) ×1 IMPLANT
COVER LIGHT HANDLE STERIS (MISCELLANEOUS) ×2 IMPLANT
DRAPE SHOULDER BEACH CHAIR (DRAPES) ×1 IMPLANT
DRAPE U-SHAPE 47X51 STRL (DRAPES) ×1 IMPLANT
DRSG AQUACEL AG ADV 3.5X10 (GAUZE/BANDAGES/DRESSINGS) ×1 IMPLANT
ELECT BLADE 6 FLAT ULTRCLN (ELECTRODE) ×1 IMPLANT
ELECT REM PT RETURN 9FT ADLT (ELECTROSURGICAL) ×1
ELECTRODE REM PT RTRN 9FT ADLT (ELECTROSURGICAL) ×1 IMPLANT
GLENOID WITH CLEAT SM (Miscellaneous) IMPLANT
GLOVE BIOGEL PI IND STRL 7.0 (GLOVE) ×4 IMPLANT
GLOVE BIOGEL PI IND STRL 8 (GLOVE) IMPLANT
GLOVE SURG SS PI 7.0 STRL IVOR (GLOVE) IMPLANT
GLOVE SURG SS PI 8.0 STRL IVOR (GLOVE) IMPLANT
GOWN STRL REUS W/TWL LRG LVL3 (GOWN DISPOSABLE) ×3 IMPLANT
HANDPIECE INTERPULSE COAX TIP (DISPOSABLE) ×1
HEAD HUMERAL ECLIPSE 41/18 (Shoulder) IMPLANT
HOOD W/PEELAWAY (MISCELLANEOUS) ×3 IMPLANT
IMPL ECLIPSE SPEEDCAP (Shoulder) IMPLANT
IMPLANT ECLIPSE SPEEDCAP (Shoulder) ×1 IMPLANT
IV NS IRRIG 3000ML ARTHROMATIC (IV SOLUTION) ×1 IMPLANT
KIT BLADEGUARD II DBL (SET/KITS/TRAYS/PACK) ×1 IMPLANT
KIT POSITION SHOULDER SCHLEI (MISCELLANEOUS) ×1 IMPLANT
KIT SET UNIVERSAL (KITS) IMPLANT
KIT TURNOVER KIT A (KITS) ×1 IMPLANT
MANIFOLD NEPTUNE II (INSTRUMENTS) ×1 IMPLANT
MARKER SKIN DUAL TIP RULER LAB (MISCELLANEOUS) ×1 IMPLANT
NDL HYPO 21X1.5 SAFETY (NEEDLE) IMPLANT
NDL MAYO 6 CRC TAPER PT (NEEDLE) IMPLANT
NEEDLE HYPO 21X1.5 SAFETY (NEEDLE)
NEEDLE MAYO 6 CRC TAPER PT (NEEDLE) ×1
NS IRRIG 1000ML POUR BTL (IV SOLUTION) ×1 IMPLANT
PACK BASIC III (CUSTOM PROCEDURE TRAY) ×1
PACK SRG BSC III STRL LF ECLPS (CUSTOM PROCEDURE TRAY) ×1 IMPLANT
PACK TOTAL JOINT (CUSTOM PROCEDURE TRAY) ×1 IMPLANT
PAD ABD 5X9 TENDERSORB (GAUZE/BANDAGES/DRESSINGS) IMPLANT
PAD ARMBOARD 7.5X6 YLW CONV (MISCELLANEOUS) ×1 IMPLANT
PAD COLD SHLDR SM WRAP-ON (PAD) IMPLANT
PIN NITINOL TARGETER 2.8 (PIN) IMPLANT
SCREW SM FOR ECLIPSE CAGE 30 (Screw) IMPLANT
SET BASIN LINEN APH (SET/KITS/TRAYS/PACK) ×1 IMPLANT
SET HNDPC FAN SPRY TIP SCT (DISPOSABLE) IMPLANT
SIZER ECLIPSE CAGE SCREW (ORTHOPEDIC DISPOSABLE SUPPLIES) IMPLANT
SLING ULTRA III MED (ORTHOPEDIC SUPPLIES) IMPLANT
STRIP CLOSURE SKIN 1/2X4 (GAUZE/BANDAGES/DRESSINGS) ×2 IMPLANT
SUT MNCRL AB 4-0 PS2 18 (SUTURE) ×1 IMPLANT
SUT MON AB 2-0 CT1 36 (SUTURE) ×1 IMPLANT
SUT VIC AB 0 CT1 27 (SUTURE) ×1
SUT VIC AB 0 CT1 27XBRD ANTBC (SUTURE) ×1 IMPLANT
SUTURE TAPE 1.3 40 TPR END (SUTURE) ×3 IMPLANT
SUTURETAPE 1.3 40 TPR END (SUTURE) ×2
SUTURETAPE 1.3 40 W/NDL BLK/WH (SUTURE) ×3 IMPLANT
SYR 30ML LL (SYRINGE) IMPLANT
SYR BULB IRRIG 60ML STRL (SYRINGE) ×2 IMPLANT
TOWEL OR 17X26 4PK STRL BLUE (TOWEL DISPOSABLE) ×1 IMPLANT
TRAY FOL W/BAG SLVR 16FR STRL (SET/KITS/TRAYS/PACK) IMPLANT
TRAY FOLEY W/BAG SLVR 16FR LF (SET/KITS/TRAYS/PACK) ×1
WATER STERILE IRR 1000ML POUR (IV SOLUTION) ×2 IMPLANT
YANKAUER SUCT 12FT TUBE ARGYLE (SUCTIONS) ×1 IMPLANT

## 2023-01-07 NOTE — Op Note (Signed)
Orthopaedic Surgery Operative Note (CSN: [id number])  Megan Salazar  03/10/1961 Date of Surgery: 01/07/2023   Diagnoses:  LEFT GLENOHUMERAL ARTHRITIS  Procedure: Anatomic left shoulder arthroplasty   Operative Finding Successful completion of the planned procedure.     Post-Op Diagnosis: Same Surgeons:Primary: Oliver Barre, MD Assistants:  Cecile Sheerer Location: [facility] OR ROOM 4 Anesthesia: General with regional anesthesia Antibiotics: Ancef 2 g with local vancomycin powder 1 g at the surgical site Tourniquet time: N/A Estimated Blood Loss:  300 cc Complications: None Specimens: None  Implants: Implant Name Type Inv. Item Serial No. Manufacturer Lot No. LRB No. Used Action  CEMENT HV SMART SET - [id number] Cement CEMENT HV SMART SET  [employer] [id number] Left 1 Implanted  GLENOID WITH CLEAT SM - [id number] Miscellaneous GLENOID WITH CLEAT SM  [employer] [id number] Left 1 Implanted  BODY TRUNION ECLIPSE 41 SL - [id number] Shoulder BODY TRUNION ECLIPSE 41 SL  [employer] [id number] Left 1 Implanted  SCREW SM FOR ECLIPSE CAGE 30 - [id number] Screw SCREW SM FOR ECLIPSE CAGE 30  [employer] [id number] Left 1 Implanted  HEAD HUMERAL ECLIPSE 41/18 - [id number] Shoulder HEAD HUMERAL ECLIPSE 41/18  [employer] [id number] Left 1 Implanted  IMPLANT ECLIPSE SPEEDCAP - [id number] Shoulder IMPLANT ECLIPSE SPEEDCAP  [employer] [id number] Left 1 Implanted    Indications for Surgery:   Megan Salazar is a 62 y.o. female with progressively worsening Left shoulder pain and imaging demonstrating severe glenohumeral arthritis.  Attempts to improve the pain with medications, exercises, activity modifications and shoulder injections have not provided sufficient relief.  Patient is now complaining of worsening function and debilitating pain.  As a result, I have recommended shoulder replacement in order to restore form and function.  Benefits and risks of operative and nonoperative management  were discussed prior to surgery with the patient and informed consent form was completed.  Specific risks including infection, need for additional surgery, instability, dislocation, bleeding, damage to surrounding structures, persistent pain and more severe complications associated with anesthesia.  All questions were answered.    Procedure:   The patient was identified properly. Informed consent was obtained and the surgical site was marked. The patient was taken to the OR where general anesthesia was induced.  The patient was positioned in beach chair position with a pneumatic arm holder.  The left arm was prepped and draped in the usual sterile fashion.  Timeout was performed before the beginning of the case.  Patient received the above stated antibiotics and 1 g of TXA prior to making incision.  A standard deltopectoral approach was performed with a #10 blade. We dissected down to the subcutaneous tissues and the cephalic vein was taken laterally with the deltoid. The clavipectoral fascia was incised in line with the incision. Deep retractors were placed. The long of the biceps tendon was identified and there was significant tenosynovitis present.  Tenodesis was performed to the pectoralis tendon with #2 Fiberwire. The remaining biceps was followed up into the rotator interval where it was released. The subscapularis was taken down tendon peel off the footprint. The underlying capsule was elevated off of the humeral neck and the osteophytes inferiorly. #2 Fiberwire sutures are passed through the tendon for subscap manipulation. We continued releasing the capsule directly off of the osteophytes inferiorly all the way around the corner. This allowed [place] to dislocate the humeral head. The humeral head had evidence of severe osteoarthritic wear with full-thickness cartilage loss and exposed subchondral bone. There was  significant flattening of the humeral head.   The rotator cuff was carefully examined and  noted to be intact without sign of wear.  The decision was confirmed that an anatomic total shoulder was indicated for this patient.  There were osteophytes along the inferior humeral neck. The osteophytes were removed with an osteotome and a rongeur and the anatomic neck was well visualized.   We next made our humeral osteotomy with an oscillating saw along the anatomic neck. The head fragment was passed off the back table and measured approximately 41 mm in diameter.   Bone quality was reasonable and we decided that it was appropriate to use stemless implants and placed a guidepin perpendicular to our cut using a guide.  This obtained bicortical purchase.  We then reamed off of this to a flat surface and drilled and placed our nucleus.  A cut protector was placed.  The subscapularis was again identified and immediately we took care to palpate the axillary nerve anteriorly and verify its position with gentle palpation as well as the tug test.  We then released the SGHL with bovie cautery prior to placing a curved mayo at the junction of the anterior glenoid well above the axillary nerve and bluntly dissecting the subscapularis from the capsule.  We then carefully protected the axillary nerve as we gently released the inferior capsule to fully mobilize the subscapularis.  An anterior deltoid retractor was then placed as well as a small Hohmann retractor superiorly.    The glenoid was inspected and had evidence of severe osteoarthritic wear with full-thickness cartilage loss and exposed subchondral bone. The remaining labrum was removed circumferentially taking great care not to disrupt the posterior capsule.  Based on our preoperative templating, the guide was placed in the appropriate position. The center hole was drilled through the guide.  The glenoid was reamed concentrically over the guide pin. Next the center hole was enlarged and the drill guide for the peripheral pegs was placed. The vault was  intact. The peripheral pegs were drilled in standard fashion. A trial glenoid was placed and was stable. We removed the trial components.   The glenoid bone was prepared with pulsatile lavage and a sponge to dry the bone prior to cement application. Cement was mixed on the back table the peripheral peg holes were cemented. The glenoid was impacted securely.   We turned attention back to the humeral side. The cut protector was removed. We trialed with multiple size head options and selected a 41 x 18 which re-created the patient's anatomy. The offset was dialed in to match the normal anatomy. The shoulder was trialed.  There was good ROM in all planes and the shoulder was stable with approximately 50% posterior spring back and no inferior translation.  The real humeral implants were opened on the back table and assembled.  The trial was removed. The humeral implant tray was positioned and the cage screw was placed though the tray.  This achieved excellent purchase. Next we impacted the appropriately sized humeral head on to the tray.  The joint was reduced and thoroughly irrigated with pulsatile lavage. We placed multiple fibertak anchors around the implant, spanning the footprint of the subscapularis.  The sutures were passed through the subscapularis tendon and then secured within the biciptal groove with swivel lock anchors to complete a double row repair.  Next the rotator interval was closed with #2 fiberwire suture. Hemostasis was obtained. The deltopectoral interval was reapproximated with #1 vicryl. The subcutaneous tissues  were closed with 2-0 Vicryl and the skin was closed with a running monocryl. The incisions were cleaned and dried and an Aquacel dressing was placed. The drapes taken down. The arm was placed into sling with abduction pillow and a polar care machine.   Patient was awakened, extubated, and transferred to the recovery room in stable condition. There were no intraoperative  complications. The sponge, needle, and attention counts were correct at the end of the case.     Post-operative plan:  The patient will be admitted for over night observation To remain in the sling at all times, NWB PT to begin in the next 7-10 days.   DVT prophylaxis Aspirin 81 mg twice daily for 6 weeks, or the patient is fully ambulatory Pain control with PRN pain medication preferring oral medicines.   Follow up plan will be scheduled in approximately 10-14 days for incision check and XR

## 2023-01-07 NOTE — H&P (Signed)
Below is the most recent clinic note for [place] A Steffensmeier; any pertinent information regarding their recent medical history will be updated on the day of surgery.   Return Patient Visit  Assessment: Jasira Robinson Brosky is a 62 y.o. female with the following: 1. Glenohumeral arthritis, left    Plan: Randi A Salvetti has severe left glenohumeral joint arthritis.  We have previously discussed total shoulder replacement.  However, her A1c was an issue.  This has now resolved.  She is doing much better, and most recent A1c was less than 7.  Otherwise, she states that her left shoulder is doing well at this time.  She has been limited in her activities for multiple reasons, but is not interested in shoulder replacement at this time.  We reviewed updated radiographs in clinic today which demonstrates severe degenerative changes, with flattening of the humeral head.  We reviewed the procedure in detail.  All questions have been answered.  I provided her with pamphlets in clinic today.  If her shoulder worsens, or she is interested in pursuing surgery at any time, I have asked her to contact clinic.  Otherwise, follow-up as needed.   Follow-up: No follow-ups on file.  Subjective:  No chief complaint on file.   History of Present Illness: Dylann Layne Abe is a 62 y.o. female who returns to clinic today for repeat evaluation of her left shoulder pain.  She has known left shoulder arthritis.  She has limited motion.  Currently, her pain is not severe.  For multiple reasons, she has not been as active as she usually is.  As such, she is not having as much pain.  She is still interested in avoiding surgery.  However, her A1c has significantly improved, and is now less than 7.  As such, she would be candidate.  Review of Systems: No fevers or chills No numbness or tingling No chest pain No shortness of breath No bowel or bladder dysfunction No GI distress No  headaches   Objective: BP (!) 148/71 (BP Location: Right Arm)   Pulse 61   Temp 98.6 F (37 C)   Resp 15   SpO2 100%   Physical Exam:  General: Alert and oriented. and No acute distress. Gait: Normal gait.   severely limited range of motion in her left shoulder.  Difficulty getting her arm to 90 degrees of forward flexion.  Difficulty getting her arm to her lower back.  There is crepitus with range of motion testing.  IMAGING: I personally ordered and reviewed the following images  X-rays of the left shoulder were obtained in clinic today.  No acute injuries are noted.  Advanced degenerative changes of the glenohumeral joint.  There is flattening of the humeral head.  Inferior osteophytes.  The joint is reduced.  No evidence of proximal humeral migration.  No bony lesions.  Impression: Severe left glenohumeral joint arthritis        Oliver Barre, MD  01/07/2023 7:17 AM

## 2023-01-07 NOTE — Anesthesia Postprocedure Evaluation (Signed)
Anesthesia Post Note  Patient: Ladaija A Fetterly  Procedure(s) Performed: LEFT TOTAL SHOULDER ARTHROPLASTY (Left: Shoulder)  Patient location during evaluation: Phase II Anesthesia Type: General Level of consciousness: awake Pain management: pain level controlled Vital Signs Assessment: post-procedure vital signs reviewed and stable Respiratory status: spontaneous breathing and respiratory function stable Cardiovascular status: blood pressure returned to baseline and stable Postop Assessment: no headache and no apparent nausea or vomiting Anesthetic complications: no Comments: Late entry   No notable events documented.   Last Vitals:  Vitals:   01/07/23 1145 01/07/23 1200  BP: 120/64 124/67  Pulse: (!) 59 60  Resp: 15 15  Temp:    SpO2: 100% 100%    Last Pain:  Vitals:   01/07/23 6045  PainSc: 0-No pain                 Windell Norfolk

## 2023-01-07 NOTE — Transfer of Care (Signed)
Immediate Anesthesia Transfer of Care Note  Patient: Megan Salazar  Procedure(s) Performed: LEFT TOTAL SHOULDER ARTHROPLASTY (Left: Shoulder)  Patient Location: PACU  Anesthesia Type:GA combined with regional for post-op pain  Level of Consciousness: awake, alert , oriented, and patient cooperative  Airway & Oxygen Therapy: Patient Spontanous Breathing  Post-op Assessment: Report given to RN, Post -op Vital signs reviewed and stable, and Patient moving all extremities X 4  Post vital signs: Reviewed and stable  Last Vitals:  Vitals Value Taken Time  BP 128/70 01/07/23 1101  Temp 97.5   Pulse 78 01/07/23 1104  Resp 23 01/07/23 1104  SpO2 96 % 01/07/23 1104  Vitals shown include unfiled device data.  Last Pain:  Vitals:   01/07/23 0652  PainSc: 0-No pain         Complications: No notable events documented.

## 2023-01-07 NOTE — Progress Notes (Signed)
Instructed on incentive spirometer. 1000 ml obtained. Tolerated well.

## 2023-01-07 NOTE — Interval H&P Note (Signed)
History and Physical Interval Note:  01/07/2023 7:18 AM  Megan Salazar  has presented today for surgery, with the diagnosis of LEFT GLENOHUMERAL ARTHRITIS.  The various methods of treatment have been discussed with the patient and family. After consideration of risks, benefits and other options for treatment, the patient has consented to  Procedure(s): LEFT TOTAL SHOULDER ARTHROPLASTY VS REVERSE SHOULDER ARTHROPLASTY (Left) as a surgical intervention.  The patient's history has been reviewed, patient examined, no change in status, stable for surgery.  I have reviewed the patient's chart and labs.  Questions were answered to the patient's satisfaction.    Left anatomic shoulder arthroplasty, possible reverse shoulder arthroplasty.  This will be determined at the time of surgery.  She is aware of the plan.  All questions have been answered.     Oliver Barre

## 2023-01-07 NOTE — Anesthesia Procedure Notes (Signed)
Procedure Name: Intubation Date/Time: 01/07/2023 7:49 AM  Performed by: Jeanette Caprice, CRNAPre-anesthesia Checklist: Patient identified, Emergency Drugs available, Suction available and Patient being monitored Patient Re-evaluated:Patient Re-evaluated prior to induction Oxygen Delivery Method: Circle system utilized Preoxygenation: Pre-oxygenation with 100% oxygen Induction Type: IV induction Ventilation: Mask ventilation without difficulty and Oral airway inserted - appropriate to patient size Laryngoscope Size: Mac and 3 Grade View: Grade I Tube type: Oral Tube size: 7.5 mm Number of attempts: 1 Airway Equipment and Method: Stylet and Oral airway Placement Confirmation: ETT inserted through vocal cords under direct vision, positive ETCO2 and breath sounds checked- equal and bilateral Secured at: 20 cm Tube secured with: Tape Dental Injury: Teeth and Oropharynx as per pre-operative assessment

## 2023-01-07 NOTE — Anesthesia Preprocedure Evaluation (Signed)
Anesthesia Evaluation  Patient identified by MRN, date of birth, ID band Patient awake    Reviewed: Allergy & Precautions, H&P , NPO status , Patient's Chart, lab work & pertinent test results, reviewed documented beta blocker date and time   History of Anesthesia Complications (+) PROLONGED EMERGENCE and history of anesthetic complications  Airway Mallampati: II  TM Distance: >3 FB Neck ROM: full    Dental no notable dental hx.    Pulmonary neg pulmonary ROS, asthma , pneumonia   Pulmonary exam normal breath sounds clear to auscultation       Cardiovascular Exercise Tolerance: Good hypertension, negative cardio ROS  Rhythm:regular Rate:Normal     Neuro/Psych  Neuromuscular disease negative neurological ROS  negative psych ROS   GI/Hepatic negative GI ROS, Neg liver ROS,,,  Endo/Other  negative endocrine ROSdiabetes    Renal/GU negative Renal ROS  negative genitourinary   Musculoskeletal   Abdominal   Peds  Hematology negative hematology ROS (+)   Anesthesia Other Findings   Reproductive/Obstetrics negative OB ROS                             Anesthesia Physical Anesthesia Plan  ASA: 3  Anesthesia Plan: General and General ETT   Post-op Pain Management: Regional block*   Induction:   PONV Risk Score and Plan: Ondansetron  Airway Management Planned:   Additional Equipment:   Intra-op Plan:   Post-operative Plan:   Informed Consent: I have reviewed the patients History and Physical, chart, labs and discussed the procedure including the risks, benefits and alternatives for the proposed anesthesia with the patient or authorized representative who has indicated his/her understanding and acceptance.     Dental Advisory Given  Plan Discussed with: CRNA  Anesthesia Plan Comments:        Anesthesia Quick Evaluation

## 2023-01-08 ENCOUNTER — Encounter (HOSPITAL_COMMUNITY): Payer: Self-pay | Admitting: Orthopedic Surgery

## 2023-01-08 ENCOUNTER — Other Ambulatory Visit: Payer: Self-pay | Admitting: Orthopedic Surgery

## 2023-01-08 DIAGNOSIS — M19012 Primary osteoarthritis, left shoulder: Secondary | ICD-10-CM | POA: Diagnosis not present

## 2023-01-08 DIAGNOSIS — U071 COVID-19: Secondary | ICD-10-CM | POA: Diagnosis not present

## 2023-01-08 MED ORDER — ASPIRIN 81 MG PO TBEC: 81.0000 mg | DELAYED_RELEASE_TABLET | Freq: Two times a day (BID) | ORAL | 0 refills | Status: AC

## 2023-01-08 MED ORDER — ACETAMINOPHEN 500 MG PO TABS: 1000.0000 mg | ORAL_TABLET | Freq: Three times a day (TID) | ORAL | 0 refills | Status: AC

## 2023-01-08 MED ORDER — OXYCODONE HCL 5 MG PO TABS: 5.0000 mg | ORAL_TABLET | ORAL | 0 refills | Status: AC | PRN

## 2023-01-08 MED ORDER — ONDANSETRON HCL 4 MG PO TABS: 4.0000 mg | ORAL_TABLET | Freq: Three times a day (TID) | ORAL | 0 refills | Status: AC | PRN

## 2023-01-08 NOTE — TOC Initial Note (Signed)
Transition of Care [facility]) - Initial/Assessment Note    Patient Details  Name: Megan Salazar MRN: [id number] Date of Birth: 11-27-1960  [facility] CM/SW Contact:    Elliot Gault, LCSW Phone Number: 01/08/2023, 1:09 PM  Clinical Narrative:                  Pt stable for dc per MD. PT recommending HH and quad cane.   Spoke with pt to review above. She is agreeable to both. Pt also stating she needs PCS through her Medicaid benefits. Explained that this LCSW can start the paperwork to request it but that it will take time to be set up. Pt verbalizes understanding.  Spoke with MD to review. [name] states that his office can make the [facility] referral to the agency they work with. TOC ordered cane for delivery to pt room. He will come over to [facility] office to sign the PCS form.  Pt updated. No other TOC needs for dc.  Expected Discharge Plan: Home w Home Health Services Barriers to Discharge: Barriers Resolved   Patient Goals and CMS Choice Patient states their goals for this hospitalization and ongoing recovery are:: Return home CMS Medicare.gov Compare Post Acute Care list provided to:: Patient        Expected Discharge Plan and Services In-house Referral: Clinical Social Work   Post Acute Care Choice: Home Health Living arrangements for the past 2 months: Apartment Expected Discharge Date: 01/08/23               DME Arranged: Gilmer Mor DME Agency: [employer] Date DME Agency Contacted: 01/08/23   Representative spoke with at DME Agency: Zack            Prior Living Arrangements/Services Living arrangements for the past 2 months: Apartment Lives with:: Self Patient language and need for interpreter reviewed:: Yes Do you feel safe going back to the place where you live?: Yes      Need for Family Participation in Patient Care: Yes (Comment) Care giver support system in place?: No (comment)   Criminal Activity/Legal Involvement Pertinent to Current  Situation/Hospitalization: No - Comment as needed  Activities of Daily Living   ADL Screening (condition at time of admission) Independently performs ADLs?: Yes (appropriate for developmental age) Is the patient deaf or have difficulty hearing?: No Does the patient have difficulty seeing, even when wearing glasses/contacts?: No Does the patient have difficulty concentrating, remembering, or making decisions?: No  Permission Sought/Granted Permission sought to share information with : Facility Industrial/product designer granted to share information with : Yes, Verbal Permission Granted     Permission granted to share info w AGENCY: DME        Emotional Assessment Appearance:: Appears stated age Attitude/Demeanor/Rapport: Engaged Affect (typically observed): Pleasant Orientation: : Oriented to Self, Oriented to Place, Oriented to  Time, Oriented to Situation Alcohol / Substance Use: Not Applicable Psych Involvement: No (comment)  Admission diagnosis:  Arthritis of left glenohumeral joint [M19.012] Patient Active Problem List   Diagnosis Date Noted   Arthritis of left glenohumeral joint 01/07/2023   Preoperative clearance 12/09/2022   Type 2 diabetes mellitus (HCC) 06/19/2022   Hypertension 06/02/2022   Encounter for routine adult physical exam with abnormal findings 05/19/2022   History of adenomatous polyp of colon 07/04/2020   Acute hypoxemic respiratory failure due to COVID-19 (HCC) 03/30/2020   Hepatic cirrhosis (HCC) 01/19/2018   Colon cancer screening 01/19/2018   Chronic diarrhea 12/31/2011   PCP:  Del Nigel Berthold, FNP Pharmacy:   [facility] - [place], [place] - [address] [place] [address] Phone: [phone] Fax: [phone]     Social Determinants of Health (SDOH) Social History: SDOH Screenings   Food Insecurity: No Food Insecurity (01/07/2023)  Housing: Low Risk  (01/07/2023)  Transportation  Needs: No Transportation Needs (01/07/2023)  Utilities: Not At Risk (01/07/2023)  Depression (PHQ2-9): Low Risk  (12/09/2022)  Tobacco Use: Low Risk  (01/07/2023)   SDOH Interventions:     Readmission Risk Interventions     No data to display

## 2023-01-08 NOTE — Discharge Summary (Signed)
Patient ID: Megan Salazar MRN: [id number] DOB/AGE: March 18, 1961 62 y.o.  Admit date: 01/07/2023 Discharge date: 01/08/2023  Admission Diagnoses: Left glenohumeral arthritis  Discharge Diagnoses:  Principal Problem:   Arthritis of left glenohumeral joint   Past Medical History:  Diagnosis Date   Asthma    Bone spur    Carpal tunnel syndrome    Complication of anesthesia    Patient didnt wake up for 24 hours after anesthesia   Diabetes (HCC)    HTN (hypertension)    IBS (irritable bowel syndrome)    chronic diarrhea   Paresthesias    left side   Pneumonia 03/2020   Sciatica      Procedures Performed: Left Anatomic Total Shoulder Arthroplasty  Discharged Condition: good  Hospital Course: Patient brought in as an outpatient for surgery.  Tolerated procedure well.  Was kept for monitoring overnight for pain control and medical monitoring postop and was found to be stable for DC home the morning after surgery.  Patient was evaluated by OT/OT prior to discharge.  Patient was instructed on specific activity restrictions and all questions were answered.  Patient was discharged on POD#1 in stable condition.  They will contact the clinic if they have any concerns upon discharge.    Consults: None  Significant Diagnostic Studies: No additional pertinent studies  Treatments: Surgery  Discharge Exam:  Today's Vitals   01/07/23 1945 01/07/23 2105 01/08/23 0332 01/08/23 0627  BP:  (!) 141/68 118/64   Pulse:  63 61   Resp:  18 18   Temp:  97.9 F (36.6 C) 98 F (36.7 C)   TempSrc:  Oral Oral   SpO2:  100% 100%   Weight:      Height:      PainSc: 0-No pain   5       Alert and oriented, no acute distress  Sling fitting appropriately. Dressing is clean dry and intact.  Small amount of dry strike through Active motion intact throughout the hand Sensation intact in the axillary nerve distribution. Fingers are warm and well perfused   CBC    Latest Ref Rng &  Units 01/07/2023   11:00 AM 12/31/2022   12:50 PM 12/09/2022   11:49 AM  CBC  WBC 4.0 - 10.5 K/uL  7.0  8.7   Hemoglobin 12.0 - 15.0 g/dL 40.9  81.1  91.4   Hematocrit 36.0 - 46.0 % 36.0  37.4  38.4   Platelets 150 - 400 K/uL  202  271         Disposition: home with assistance   Allergies as of 01/08/2023       Reactions   Equal [aspartame]    PT SAID SHE IS HIGHLY ALLERGIC TO ARTIFICIAL SWEETNERS. GETS DIARRHEA EXTREMELY BAD AND HIVES.    Latex Hives   Codeine Nausea And Vomiting, Other (See Comments)   Liquid codeine   Amoxicillin Nausea And Vomiting   Erythromycin Nausea And Vomiting   Soy Allergy Diarrhea, Nausea And Vomiting   All soy products   Sulfa Antibiotics Nausea And Vomiting   Sulfa Drugs Cross Reactors Nausea And Vomiting   Other Rash   Rubber/glue - rash Grapes - N/V, diarrhea         Medication List     TAKE these medications    Accu-Chek Guide test strip Generic drug: glucose blood USE AS DIRECTED TO CHECK BLOOD GLUCOSE 3 TIMES DAILY.   Accu-Chek Softclix Lancet Dev Kit Use for up to 3 times  daily testing dx e11.65   acetaminophen 500 MG tablet Commonly known as: TYLENOL Take 2 tablets (1,000 mg total) by mouth every 8 (eight) hours for 14 days.   aspirin EC 81 MG tablet Take 1 tablet (81 mg total) by mouth in the morning and at bedtime. Swallow whole.   Blood Glucose Monitoring Suppl Devi 1 each by Does not apply route in the morning, at noon, and at bedtime. May substitute to any manufacturer covered by patient's insurance.   CALCIUM-MAGNESIUM-VITAMIN D PO Take 1 tablet by mouth daily.   DEEP BLUE RELIEF EX Apply 1 Application topically daily as needed (pain).   freestyle lancets Use as instructed   Accu-Chek Softclix Lancets lancets Use as instructed 3 times daily dx e11.65   gabapentin 100 MG capsule Commonly known as: NEURONTIN TAKE 1 CAPSULE BY MOUTH THREE TIMES DAILY What changed:  when to take this reasons to take  this   olmesartan-hydrochlorothiazide 20-12.5 MG tablet Commonly known as: BENICAR HCT TAKE ONE TABLET BY MOUTH ONCE DAILY.   ondansetron 4 MG tablet Commonly known as: Zofran Take 1 tablet (4 mg total) by mouth every 8 (eight) hours as needed for up to 14 days for nausea or vomiting.   OVER THE COUNTER MEDICATION Take 1 capsule by mouth daily. igenics eye supplement   OVER THE COUNTER MEDICATION Take 1 tablet by mouth daily. Glucofreeze supplement   OVER THE COUNTER MEDICATION Take 1 tablet by mouth every evening. Gluco6 supplement   OVER THE COUNTER MEDICATION Take 1 capsule by mouth at bedtime. Deep blue doterra capsules   oxyCODONE 5 MG immediate release tablet Commonly known as: Roxicodone Take 1 tablet (5 mg total) by mouth every 4 (four) hours as needed for up to 7 days.   rosuvastatin 5 MG tablet Commonly known as: Crestor Take 1 tablet (5 mg total) by mouth daily.   Trulicity 3 MG/0.5ML Sopn Generic drug: Dulaglutide Inject 3 mg as directed once a week.        Follow-up Information     Oliver Barre, MD Follow up.   Specialties: Orthopedic Surgery, Sports Medicine Why: 10-14 days for suture removal/incision check Contact information: [address] [place] [address] [phone]

## 2023-01-08 NOTE — Evaluation (Signed)
Physical Therapy Evaluation Patient Details Name: Megan Salazar MRN: [id number] DOB: 07/06/1960 Today's Date: 01/08/2023  History of Present Illness  Alissha A Deberry has severe left glenohumeral joint arthritis.  We have previously discussed total shoulder replacement.  However, her A1c was an issue.  This has now resolved.  She is doing much better, and most recent A1c was less than 7.  Otherwise, she states that her left shoulder is doing well at this time.  She has been limited in her activities for multiple reasons, but is not interested in shoulder replacement at this time.  We reviewed updated radiographs in clinic today which demonstrates severe degenerative changes, with flattening of the humeral head.  We reviewed the procedure in detail.  All questions have been answered.  I provided her with pamphlets in clinic today.  If her shoulder worsens, or she is interested in pursuing surgery at any time, I have asked her to contact clinic.  Otherwise, follow-up as needed.Status post Anatomic left shoulder arthroplasty.   Clinical Impression  Patient required Mod/max assist doffing/donning LUE immobilizer, demonstrated labored movement for sitting up at bedside with fair/good return for scooting to EOB.  Patient ambulated in room/hallway using wide based quad cane with good carryover for use without loss of balance and limit mostly due to c/o fatigue.  Patient tolerated sitting up in chair after therapy.  PLAN:  Patient to be discharged home today and discharged from acute physical therapy to care of nursing for ambulation as tolerated for length of stay with recommendations stated below          If plan is discharge home, recommend the following: A little help with walking and/or transfers;A little help with bathing/dressing/bathroom;Help with stairs or ramp for entrance;Assistance with cooking/housework   Can travel by Radiographer, therapeutic;Other  (comment) (Wide based quad cane)  Recommendations for Other Services       Functional Status Assessment Patient has had a recent decline in their functional status and demonstrates the ability to make significant improvements in function in a reasonable and predictable amount of time.     Precautions / Restrictions Precautions Precautions: Fall Required Braces or Orthoses: Other Brace Other Brace: LUE immobilizer Restrictions Weight Bearing Restrictions: Yes LUE Weight Bearing: Weight bearing as tolerated      Mobility  Bed Mobility Overal bed mobility: Needs Assistance Bed Mobility: Supine to Sit     Supine to sit: Contact guard, Min assist     General bed mobility comments: increased time, labored movement, c/o increased pain LUE    Transfers Overall transfer level: Needs assistance Equipment used: Quad cane Transfers: Sit to/from Stand, Bed to chair/wheelchair/BSC Sit to Stand: Modified independent (Device/Increase time), Supervision   Step pivot transfers: Modified independent (Device/Increase time), Supervision       General transfer comment: slightly labored movement    Ambulation/Gait Ambulation/Gait assistance: Supervision Gait Distance (Feet): 65 Feet Assistive device: Quad cane Gait Pattern/deviations: Decreased step length - right, Decreased step length - left, Decreased stride length, Step-to pattern Gait velocity: decreased     General Gait Details: slightly labored cadence without loss of balance with good return for using wide based quad cane  Stairs            Wheelchair Mobility     Tilt Bed    Modified Rankin (Stroke Patients Only)       Balance Overall balance assessment: Needs assistance Sitting-balance support: Feet supported, No  upper extremity supported Sitting balance-Leahy Scale: Good Sitting balance - Comments: seated EOB   Standing balance support: During functional activity, Single extremity supported Standing  balance-Leahy Scale: Fair Standing balance comment: fair/good using wide based quad cane                             Pertinent Vitals/Pain Pain Assessment Pain Assessment: Faces Faces Pain Scale: Hurts even more Pain Location: L shoulder with change in position. Pain Descriptors / Indicators: Grimacing, Guarding, Moaning Pain Intervention(s): Limited activity within patient's tolerance, Monitored during session, Repositioned    Home Living Family/patient expects to be discharged to:: Private residence Living Arrangements: Alone Available Help at Discharge: Family;Available PRN/intermittently Type of Home: Apartment Home Access: Ramped entrance       Home Layout: One level Home Equipment: Rollator (4 wheels);BSC/3in1      Prior Function Prior Level of Function : Independent/Modified Independent             Mobility Comments: Rollator PRN. Scooter in the store. ADLs Comments: Independent     Extremity/Trunk Assessment   Upper Extremity Assessment Upper Extremity Assessment: Defer to OT evaluation LUE Deficits / Details: Don joy sling adjusted. NWB    Lower Extremity Assessment Lower Extremity Assessment: Generalized weakness    Cervical / Trunk Assessment Cervical / Trunk Assessment: Kyphotic  Communication   Communication Communication: No apparent difficulties  Cognition Arousal: Alert Behavior During Therapy: WFL for tasks assessed/performed Overall Cognitive Status: Within Functional Limits for tasks assessed                                          General Comments      Exercises     Assessment/Plan    PT Assessment All further PT needs can be met in the next venue of care  PT Problem List Decreased strength;Decreased range of motion;Decreased activity tolerance;Decreased mobility;Decreased balance       PT Treatment Interventions      PT Goals (Current goals can be found in the Care Plan section)  Acute Rehab  PT Goals Patient Stated Goal: return home with home aides to assist PT Goal Formulation: With patient Time For Goal Achievement: 01/08/23 Potential to Achieve Goals: Good    Frequency       Co-evaluation PT/OT/SLP Co-Evaluation/Treatment: Yes Reason for Co-Treatment: To address functional/ADL transfers PT goals addressed during session: Mobility/safety with mobility;Balance;Proper use of DME OT goals addressed during session: ADL's and self-care       AM-PAC PT "6 Clicks" Mobility  Outcome Measure Help needed turning from your back to your side while in a flat bed without using bedrails?: A Little Help needed moving from lying on your back to sitting on the side of a flat bed without using bedrails?: A Little Help needed moving to and from a bed to a chair (including a wheelchair)?: None Help needed standing up from a chair using your arms (e.g., wheelchair or bedside chair)?: None Help needed to walk in hospital room?: A Little Help needed climbing 3-5 steps with a railing? : A Little 6 Click Score: 20    End of Session   Activity Tolerance: Patient tolerated treatment well;Patient limited by fatigue Patient left: in chair;with call bell/phone within reach Nurse Communication: Mobility status PT Visit Diagnosis: Unsteadiness on feet (R26.81);Other abnormalities of gait and mobility (R26.89);Muscle  weakness (generalized) (M62.81)    Time: 0822-0852 PT Time Calculation (min) (ACUTE ONLY): 30 min   Charges:   PT Evaluation $PT Eval Moderate Complexity: 1 Mod PT Treatments $Therapeutic Activity: 23-37 mins PT General Charges $$ ACUTE PT VISIT: 1 Visit         12:14 PM, 01/08/23 Ocie Bob, MPT Physical Therapist with [facility] [address] [phone] office [phone] mobile phone

## 2023-01-08 NOTE — Discharge Instructions (Signed)
  Jerriah Ines A. Dallas Schimke, MD MS [facility] [address] [place],  [place]  [address] Phone: [phone] Fax: [phone]    POST-OPERATIVE INSTRUCTIONS - TOTAL SHOULDER REPLACEMENT    WOUND CARE You may leave the operative dressing in place until your follow-up appointment. KEEP THE INCISIONS CLEAN AND DRY. There may be a small amount of fluid/bleeding leaking at the surgical site. This is normal after surgery.  If it fills with liquid or blood please call us immediately to change it for you. Use the provided ice machine or Ice packs as often as possible for the first 3-4 days, then as needed for pain relief.  Keep a layer of cloth or a shirt between your skin and the cooling unit to prevent frost bite as it can get very cold.  SHOWERING: - You may shower on Post-Op Day #3.  - The dressing is water resistant but do not scrub it as it may start to peel up.   - You may remove the sling for showering, but keep a water resistant pillow under the arm to keep both the  elbow and shoulder away from the body (mimicking the abduction sling).  - Gently pat the area dry.  - Do not soak the shoulder in water. Do not go swimming in the pool or ocean until your sutures are removed. - KEEP THE INCISIONS CLEAN AND DRY.  EXERCISES Wear the sling at all times except when doing your exercises. You may remove the sling for showering, but keep the arm across the chest or in a secondary sling.    Accidental/Purposeful External Rotation and shoulder flexion (reaching behind you) is to be avoided at all costs for the first month. It is ok to come out of your sling if your are sitting and have assistance for eating.  Do not lift anything heavier than 1 pound until we discuss it further in clinic. Please perform the exercises:   Elbow / Hand / Wrist  Range of Motion Exercises Grip strengthening   REGIONAL ANESTHESIA (NERVE BLOCKS) The anesthesia team may have performed a nerve block  for you if safe in the setting of your care.  This is a great tool used to minimize pain.  Typically the block may start wearing off overnight but the long acting medicine may last for 3-4 days.  The nerve block wearing off can be a challenging period but please utilize your as needed pain medications to try and manage this period.    POST-OP MEDICATIONS- Multimodal approach to pain control In general your pain will be controlled with a combination of substances.  Prescriptions unless otherwise discussed are electronically sent to your pharmacy.  This is a carefully made plan we use to minimize narcotic use.     Ibuprofen - Anti-inflammatory medication taken on a scheduled basis.  Take 1-2 pills every 8 hours for the next 2 weeks Acetaminophen - Non-narcotic pain medicine taken on a scheduled basis  Oxycodone - This is a strong narcotic, to be used only on an "as needed" basis for pain. Aspirin 81mg  - This medicine is used to minimize the risk of blood clots after surgery. Zofran -  take as needed for nausea   FOLLOW-UP If you develop a Fever (>101.5), Redness or Drainage from the surgical incision site, please call our office to arrange for an evaluation. Please call the office to schedule a follow-up appointment for a wound check, 7-10 days post-operatively.

## 2023-01-08 NOTE — Evaluation (Signed)
Occupational Therapy Evaluation Patient Details Name: Megan Salazar MRN: [id number] DOB: 1960/08/16 Today's Date: 01/08/2023   History of Present Illness Megan Salazar has severe left glenohumeral joint arthritis.  We have previously discussed total shoulder replacement.  However, her A1c was an issue.  This has now resolved.  She is doing much better, and most recent A1c was less than 7.  Otherwise, she states that her left shoulder is doing well at this time.  She has been limited in her activities for multiple reasons, but is not interested in shoulder replacement at this time.  We reviewed updated radiographs in clinic today which demonstrates severe degenerative changes, with flattening of the humeral head.  We reviewed the procedure in detail.  All questions have been answered.  I provided her with pamphlets in clinic today.  If her shoulder worsens, or she is interested in pursuing surgery at any time, I have asked her to contact clinic.  Otherwise, follow-up as needed.Status post Anatomic left shoulder arthroplasty. (per MD)   Clinical Impression   Pt agreeable to OT and PT co-evaluation. Pt sling adjusted with education on 90* positioning. Pt was able to complete lower body dressing without physical assist. Min A for bed mobility and supervision for transfers with cane. Pt reports that she is trying to have a CNA set up for when she is home, but she does not know for how long or how much time the CNA will be present. Pt left in the chair with call bell within reach. Pt is not recommended for further acute OT services and will be discharged to care of nursing staff for remaining length of stay.        If plan is discharge home, recommend the following: A little help with bathing/dressing/bathroom;Assistance with cooking/housework;Assist for transportation;Help with stairs or ramp for entrance    Functional Status Assessment  Patient has had a recent decline in their functional  status and demonstrates the ability to make significant improvements in function in a reasonable and predictable amount of time.  Equipment Recommendations  None recommended by OT           Precautions / Restrictions Precautions Precautions: Fall Required Braces or Orthoses: Sling Restrictions Weight Bearing Restrictions: Yes LUE Weight Bearing: Non weight bearing      Mobility Bed Mobility Overal bed mobility: Needs Assistance Bed Mobility: Supine to Sit     Supine to sit: Contact guard, Min assist     General bed mobility comments: labored effort; extended time    Transfers Overall transfer level: Needs assistance Equipment used: Quad cane Transfers: Sit to/from Stand, Bed to chair/wheelchair/BSC Sit to Stand: Modified independent (Device/Increase time), Supervision     Step pivot transfers: Modified independent (Device/Increase time), Supervision     General transfer comment: mild labored movement      Balance Overall balance assessment: Needs assistance Sitting-balance support: No upper extremity supported, Feet supported Sitting balance-Leahy Scale: Good Sitting balance - Comments: seated EOB   Standing balance support: During functional activity, Single extremity supported Standing balance-Leahy Scale: Fair Standing balance comment: using cane                           ADL either performed or assessed with clinical judgement   ADL Overall ADL's : Needs assistance/impaired     Grooming: Supervision/safety;Standing;Modified independent               Lower Body Dressing: Set up;Modified independent;Sitting/lateral  leans   Toilet Transfer: Supervision/safety;Modified Independent;Ambulation (quad cane) Toilet Transfer Details (indicate cue type and reason): Simulated via ambulation in the room and hall.         Functional mobility during ADLs: Modified independent;Supervision/safety;Cane       Vision Baseline Vision/History: 1  Wears glasses Ability to See in Adequate Light: 1 Impaired Patient Visual Report: No change from baseline Vision Assessment?:  (baseline deficits)     Perception Perception: Not tested       Praxis Praxis: Not tested       Pertinent Vitals/Pain Pain Assessment Pain Assessment: Faces Faces Pain Scale: Hurts even more Pain Location: L shoulder with change in position. Pain Descriptors / Indicators: Grimacing, Guarding, Moaning Pain Intervention(s): Limited activity within patient's tolerance, Monitored during session, Repositioned     Extremity/Trunk Assessment Upper Extremity Assessment Upper Extremity Assessment: LUE deficits/detail LUE Deficits / Details: Don joy sling adjusted. NWB   Lower Extremity Assessment Lower Extremity Assessment: Defer to PT evaluation   Cervical / Trunk Assessment Cervical / Trunk Assessment: Kyphotic   Communication Communication Communication: No apparent difficulties   Cognition Arousal: Alert Behavior During Therapy: WFL for tasks assessed/performed Overall Cognitive Status: Within Functional Limits for tasks assessed                                                        Home Living Family/patient expects to be discharged to:: Private residence Living Arrangements: Alone Available Help at Discharge: Family;Available PRN/intermittently Type of Home: Apartment Home Access: Ramped entrance     Home Layout: One level     Bathroom Shower/Tub: Chief Strategy Officer: Standard Bathroom Accessibility: Yes How Accessible: Accessible via walker Home Equipment: Rollator (4 wheels);BSC/3in1          Prior Functioning/Environment Prior Level of Function : Independent/Modified Independent             Mobility Comments: Rollator PRN. Scooter in the store. ADLs Comments: Independent                                Co-evaluation PT/OT/SLP Co-Evaluation/Treatment: Yes Reason for  Co-Treatment: To address functional/ADL transfers   OT goals addressed during session: ADL's and self-care      AM-PAC OT "6 Clicks" Daily Activity     Outcome Measure Help from another person eating meals?: None Help from another person taking care of personal grooming?: A Little Help from another person toileting, which includes using toliet, bedpan, or urinal?: A Little Help from another person bathing (including washing, rinsing, drying)?: A Little Help from another person to put on and taking off regular upper body clothing?: A Little Help from another person to put on and taking off regular lower body clothing?: A Little 6 Click Score: 19   End of Session Equipment Utilized During Treatment: Gait belt (cane)  Activity Tolerance: Patient tolerated treatment well Patient left: in chair;with call bell/phone within reach  OT Visit Diagnosis: Unsteadiness on feet (R26.81);Other abnormalities of gait and mobility (R26.89);Muscle weakness (generalized) (M62.81)                Time: 1324-4010 OT Time Calculation (min): 33 min Charges:  OT General Charges $OT Visit: 1 Visit OT Evaluation $OT Eval Low Complexity: 1 Low  Alucard Fearnow OT, MOT  Danie Chandler 01/08/2023, 9:45 AM

## 2023-01-11 ENCOUNTER — Ambulatory Visit (INDEPENDENT_AMBULATORY_CARE_PROVIDER_SITE_OTHER): Payer: Medicaid Other | Admitting: Orthopedic Surgery

## 2023-01-11 ENCOUNTER — Encounter: Payer: Self-pay | Admitting: Orthopedic Surgery

## 2023-01-11 DIAGNOSIS — M19012 Primary osteoarthritis, left shoulder: Secondary | ICD-10-CM

## 2023-01-11 NOTE — Progress Notes (Signed)
  Orthopaedic Postop Note  Assessment: Megan Salazar is a 62 y.o. female s/p left anatomic shoulder arthroplasty  DOS:  01/07/2023  Plan: Megan Salazar had concerns following surgery.  Abdominal strap from the pillow was uncomfortable and causing issues.  Removed pillow early.  Keep arm at side in sling at all times.  Plan for PT/OT this week.  Follow up in about 10 days.  Ok to shower.  Follow-up: Return in about 9 days (around 01/20/2023). XR at next visit: Left shoulder  Subjective:  Chief Complaint  Patient presents with   Post-op Follow-up    Left shoulder replaced 01/07/23 c/o discomfort axillary area    History of Present Illness: Megan Salazar is a 62 y.o. female who presents following the above stated procedure.  She was having issues over the weekend.  Strap around his abdomen causing problem.  Numbness has improved.  Pain is controlled.   Review of Systems: No fevers or chills No numbness or tingling No Chest Pain No shortness of breath   Objective: There were no vitals taken for this visit.  Physical Exam:  Alert and oriented  Dressing is clean, dry and intact No bleeding Some bruising Sensation intact in axillary nerve distribution Fingers are warm and well perfused Sensation intact throughout the hand.    IMAGING: I personally ordered and reviewed the following images:  No new imaging obtained.   Oliver Barre, MD 01/11/2023 4:08 PM

## 2023-01-12 ENCOUNTER — Encounter: Payer: Medicaid Other | Admitting: Orthopedic Surgery

## 2023-01-17 DIAGNOSIS — K581 Irritable bowel syndrome with constipation: Secondary | ICD-10-CM | POA: Diagnosis not present

## 2023-01-17 DIAGNOSIS — Z8701 Personal history of pneumonia (recurrent): Secondary | ICD-10-CM | POA: Diagnosis not present

## 2023-01-17 DIAGNOSIS — Z7985 Long-term (current) use of injectable non-insulin antidiabetic drugs: Secondary | ICD-10-CM | POA: Diagnosis not present

## 2023-01-17 DIAGNOSIS — E119 Type 2 diabetes mellitus without complications: Secondary | ICD-10-CM | POA: Diagnosis not present

## 2023-01-17 DIAGNOSIS — R32 Unspecified urinary incontinence: Secondary | ICD-10-CM | POA: Diagnosis not present

## 2023-01-17 DIAGNOSIS — Z96612 Presence of left artificial shoulder joint: Secondary | ICD-10-CM | POA: Diagnosis not present

## 2023-01-17 DIAGNOSIS — M5430 Sciatica, unspecified side: Secondary | ICD-10-CM | POA: Diagnosis not present

## 2023-01-17 DIAGNOSIS — Z8616 Personal history of COVID-19: Secondary | ICD-10-CM | POA: Diagnosis not present

## 2023-01-17 DIAGNOSIS — Z471 Aftercare following joint replacement surgery: Secondary | ICD-10-CM | POA: Diagnosis not present

## 2023-01-17 DIAGNOSIS — I10 Essential (primary) hypertension: Secondary | ICD-10-CM | POA: Diagnosis not present

## 2023-01-17 DIAGNOSIS — Z7982 Long term (current) use of aspirin: Secondary | ICD-10-CM | POA: Diagnosis not present

## 2023-01-17 DIAGNOSIS — J45909 Unspecified asthma, uncomplicated: Secondary | ICD-10-CM | POA: Diagnosis not present

## 2023-01-17 DIAGNOSIS — G5600 Carpal tunnel syndrome, unspecified upper limb: Secondary | ICD-10-CM | POA: Diagnosis not present

## 2023-01-18 ENCOUNTER — Telehealth: Payer: Self-pay | Admitting: Orthopedic Surgery

## 2023-01-18 ENCOUNTER — Ambulatory Visit: Payer: Medicaid Other | Admitting: Family Medicine

## 2023-01-18 DIAGNOSIS — G5600 Carpal tunnel syndrome, unspecified upper limb: Secondary | ICD-10-CM | POA: Diagnosis not present

## 2023-01-18 DIAGNOSIS — R32 Unspecified urinary incontinence: Secondary | ICD-10-CM | POA: Diagnosis not present

## 2023-01-18 DIAGNOSIS — Z471 Aftercare following joint replacement surgery: Secondary | ICD-10-CM | POA: Diagnosis not present

## 2023-01-18 DIAGNOSIS — Z8701 Personal history of pneumonia (recurrent): Secondary | ICD-10-CM | POA: Diagnosis not present

## 2023-01-18 DIAGNOSIS — Z7985 Long-term (current) use of injectable non-insulin antidiabetic drugs: Secondary | ICD-10-CM | POA: Diagnosis not present

## 2023-01-18 DIAGNOSIS — Z96612 Presence of left artificial shoulder joint: Secondary | ICD-10-CM | POA: Diagnosis not present

## 2023-01-18 DIAGNOSIS — Z8616 Personal history of COVID-19: Secondary | ICD-10-CM | POA: Diagnosis not present

## 2023-01-18 DIAGNOSIS — I10 Essential (primary) hypertension: Secondary | ICD-10-CM | POA: Diagnosis not present

## 2023-01-18 DIAGNOSIS — K581 Irritable bowel syndrome with constipation: Secondary | ICD-10-CM | POA: Diagnosis not present

## 2023-01-18 DIAGNOSIS — J45909 Unspecified asthma, uncomplicated: Secondary | ICD-10-CM | POA: Diagnosis not present

## 2023-01-18 DIAGNOSIS — E119 Type 2 diabetes mellitus without complications: Secondary | ICD-10-CM | POA: Diagnosis not present

## 2023-01-18 DIAGNOSIS — Z7982 Long term (current) use of aspirin: Secondary | ICD-10-CM | POA: Diagnosis not present

## 2023-01-18 DIAGNOSIS — M5430 Sciatica, unspecified side: Secondary | ICD-10-CM | POA: Diagnosis not present

## 2023-01-18 NOTE — Telephone Encounter (Signed)
[name] [name] [facility] OT w/[facility] [phone] requesting [facility] OT twice a week for 8 weeks.

## 2023-01-18 NOTE — Telephone Encounter (Signed)
I called with verbal orders FYI only this is done

## 2023-01-19 ENCOUNTER — Telehealth: Payer: Self-pay | Admitting: Orthopedic Surgery

## 2023-01-19 NOTE — Telephone Encounter (Signed)
[name] pt - spoke with [facility] w/[facility] [phone], she is requesting [facility] PT 2 times a week for 1 week and 1 time a week for 3 weeks.

## 2023-01-19 NOTE — Telephone Encounter (Signed)
Verbal order given to Megan Salazar  

## 2023-01-20 ENCOUNTER — Ambulatory Visit (INDEPENDENT_AMBULATORY_CARE_PROVIDER_SITE_OTHER): Payer: Medicaid Other | Admitting: Orthopedic Surgery

## 2023-01-20 ENCOUNTER — Ambulatory Visit (INDEPENDENT_AMBULATORY_CARE_PROVIDER_SITE_OTHER): Payer: Medicaid Other

## 2023-01-20 ENCOUNTER — Telehealth: Payer: Self-pay | Admitting: Family Medicine

## 2023-01-20 ENCOUNTER — Encounter: Payer: Self-pay | Admitting: Orthopedic Surgery

## 2023-01-20 DIAGNOSIS — M19012 Primary osteoarthritis, left shoulder: Secondary | ICD-10-CM

## 2023-01-20 DIAGNOSIS — Z96612 Presence of left artificial shoulder joint: Secondary | ICD-10-CM | POA: Diagnosis not present

## 2023-01-20 NOTE — Telephone Encounter (Signed)
Prescription Request  01/20/2023  LOV: 12/09/2022  What is the name of the medication or equipment? Dulaglutide (TRULICITY) 3 MG/0.5ML SOPN [[id number]]    Have you contacted your pharmacy to request a refill? Yes   Which pharmacy would you like this sent to?  [facility] - [place], [place] - [address] [place] [address] Phone: [phone] Fax: [phone]    Patient notified that their request is being sent to the clinical staff for review and that they should receive a response within 2 business days.   Please advise at [facility] [phone]

## 2023-01-20 NOTE — Progress Notes (Addendum)
Orthopaedic Postop Note  Assessment: Megan Salazar is a 62 y.o. female s/p Left Anatomic Total Shoulder Arthroplasty  DOS: 01/07/2023  Plan: Sutures were trimmed, steri strips were placed Ok to remove the abduction pillow; can stop using the sling around 4 weeks postop Physical therapy prescription and protocol provided; home health for now Anticipated progression discussed, XR reviewed in clinic Follow up 4 weeks   Follow-up: Return in about 4 weeks (around 02/17/2023).  XR at next visit: Left shoulder  Subjective:  Chief Complaint  Patient presents with   Post-op Follow-up    Left shoulder replacement sutures trimmed     History of Present Illness: Megan Salazar is a 62 y.o. female who presents following the above stated procedure.  She is comfortable.  She has been assessed by PT and is scheduled for additional home health PT sessions.  She has no numbness or tingling.  Pain is controlled.  She has been more comfortable without using the pillow.  Review of Systems: No fevers or chills No numbness or tingling No Chest Pain No shortness of breath   Objective: There were no vitals taken for this visit.  Physical Exam:  Alert and oriented.  No acute distress.  Left shoulder sling is fitting appropriately.  She is holding it at her side.  There is swelling in the anterior shoulder.  She has tenderness to palpation.  She tolerates gentle forward flexion to approximately 70 degrees.  Fingers are warm and well-perfused.  Sensation intact in the axillary nerve distribution.  2+ radial pulse.  IMAGING: I personally ordered and reviewed the following images:  XR of the Left shoulder were obtained in clinic today and demonstrates a stemless shoulder arthroplasty with implants in good position.  No evidence of acute injury or subsidence of implants.  Humeral head component is seated within the glenoid and based on the scapular Y projection.  Impression: Left  shoulder arthroplasty in good position   Oliver Barre, MD 01/20/2023 10:31 AM

## 2023-01-21 ENCOUNTER — Other Ambulatory Visit: Payer: Self-pay

## 2023-01-21 DIAGNOSIS — R32 Unspecified urinary incontinence: Secondary | ICD-10-CM | POA: Diagnosis not present

## 2023-01-21 DIAGNOSIS — Z7985 Long-term (current) use of injectable non-insulin antidiabetic drugs: Secondary | ICD-10-CM | POA: Diagnosis not present

## 2023-01-21 DIAGNOSIS — Z8701 Personal history of pneumonia (recurrent): Secondary | ICD-10-CM | POA: Diagnosis not present

## 2023-01-21 DIAGNOSIS — I10 Essential (primary) hypertension: Secondary | ICD-10-CM | POA: Diagnosis not present

## 2023-01-21 DIAGNOSIS — Z8616 Personal history of COVID-19: Secondary | ICD-10-CM | POA: Diagnosis not present

## 2023-01-21 DIAGNOSIS — M5430 Sciatica, unspecified side: Secondary | ICD-10-CM | POA: Diagnosis not present

## 2023-01-21 DIAGNOSIS — K581 Irritable bowel syndrome with constipation: Secondary | ICD-10-CM | POA: Diagnosis not present

## 2023-01-21 DIAGNOSIS — G5600 Carpal tunnel syndrome, unspecified upper limb: Secondary | ICD-10-CM | POA: Diagnosis not present

## 2023-01-21 DIAGNOSIS — Z7982 Long term (current) use of aspirin: Secondary | ICD-10-CM | POA: Diagnosis not present

## 2023-01-21 DIAGNOSIS — E119 Type 2 diabetes mellitus without complications: Secondary | ICD-10-CM | POA: Diagnosis not present

## 2023-01-21 DIAGNOSIS — J45909 Unspecified asthma, uncomplicated: Secondary | ICD-10-CM | POA: Diagnosis not present

## 2023-01-21 DIAGNOSIS — Z96612 Presence of left artificial shoulder joint: Secondary | ICD-10-CM | POA: Diagnosis not present

## 2023-01-21 DIAGNOSIS — Z471 Aftercare following joint replacement surgery: Secondary | ICD-10-CM | POA: Diagnosis not present

## 2023-01-21 MED ORDER — TRULICITY 3 MG/0.5ML ~~LOC~~ SOAJ: 3.0000 mg | SUBCUTANEOUS | 1 refills | Status: DC

## 2023-01-21 NOTE — Telephone Encounter (Signed)
refilled 

## 2023-01-21 NOTE — Telephone Encounter (Signed)
Megan Salazar , please inform patient she needs to take Trulicity 3 mg  once a week

## 2023-01-21 NOTE — Telephone Encounter (Signed)
Patient called said she has a 2.5 mg. Patient needs to know what medications does she need to take, needs some advice. Call back # [phone]  Trulcity 73m ?

## 2023-01-22 NOTE — Telephone Encounter (Signed)
Pt. Has been notified, and will have order delivered today.

## 2023-01-22 NOTE — Telephone Encounter (Signed)
Patient calling office back, saying this is her 3rd call waiting on nurse or Iliana to return her call back the TRULICITY shot does not know what to do. Asking for a call back today.

## 2023-01-25 NOTE — Telephone Encounter (Signed)
Pt LVM returning call . States that she did get her Trulicity

## 2023-01-26 NOTE — Telephone Encounter (Signed)
Spoke to pt. She has medication, and is comfortable on how to administer.

## 2023-01-27 DIAGNOSIS — Z96612 Presence of left artificial shoulder joint: Secondary | ICD-10-CM | POA: Diagnosis not present

## 2023-01-27 DIAGNOSIS — Z8701 Personal history of pneumonia (recurrent): Secondary | ICD-10-CM | POA: Diagnosis not present

## 2023-01-27 DIAGNOSIS — Z8616 Personal history of COVID-19: Secondary | ICD-10-CM | POA: Diagnosis not present

## 2023-01-27 DIAGNOSIS — Z471 Aftercare following joint replacement surgery: Secondary | ICD-10-CM | POA: Diagnosis not present

## 2023-01-27 DIAGNOSIS — Z7985 Long-term (current) use of injectable non-insulin antidiabetic drugs: Secondary | ICD-10-CM | POA: Diagnosis not present

## 2023-01-27 DIAGNOSIS — J45909 Unspecified asthma, uncomplicated: Secondary | ICD-10-CM | POA: Diagnosis not present

## 2023-01-27 DIAGNOSIS — M5430 Sciatica, unspecified side: Secondary | ICD-10-CM | POA: Diagnosis not present

## 2023-01-27 DIAGNOSIS — I10 Essential (primary) hypertension: Secondary | ICD-10-CM | POA: Diagnosis not present

## 2023-01-27 DIAGNOSIS — Z7982 Long term (current) use of aspirin: Secondary | ICD-10-CM | POA: Diagnosis not present

## 2023-01-27 DIAGNOSIS — K581 Irritable bowel syndrome with constipation: Secondary | ICD-10-CM | POA: Diagnosis not present

## 2023-01-27 DIAGNOSIS — R32 Unspecified urinary incontinence: Secondary | ICD-10-CM | POA: Diagnosis not present

## 2023-01-27 DIAGNOSIS — E119 Type 2 diabetes mellitus without complications: Secondary | ICD-10-CM | POA: Diagnosis not present

## 2023-01-27 DIAGNOSIS — G5600 Carpal tunnel syndrome, unspecified upper limb: Secondary | ICD-10-CM | POA: Diagnosis not present

## 2023-01-28 ENCOUNTER — Encounter: Payer: Medicaid Other | Admitting: Orthopedic Surgery

## 2023-01-28 DIAGNOSIS — K581 Irritable bowel syndrome with constipation: Secondary | ICD-10-CM | POA: Diagnosis not present

## 2023-01-28 DIAGNOSIS — Z96612 Presence of left artificial shoulder joint: Secondary | ICD-10-CM | POA: Diagnosis not present

## 2023-01-28 DIAGNOSIS — I10 Essential (primary) hypertension: Secondary | ICD-10-CM | POA: Diagnosis not present

## 2023-01-28 DIAGNOSIS — G5600 Carpal tunnel syndrome, unspecified upper limb: Secondary | ICD-10-CM | POA: Diagnosis not present

## 2023-01-28 DIAGNOSIS — M5430 Sciatica, unspecified side: Secondary | ICD-10-CM | POA: Diagnosis not present

## 2023-01-28 DIAGNOSIS — E119 Type 2 diabetes mellitus without complications: Secondary | ICD-10-CM | POA: Diagnosis not present

## 2023-01-28 DIAGNOSIS — Z8701 Personal history of pneumonia (recurrent): Secondary | ICD-10-CM | POA: Diagnosis not present

## 2023-01-28 DIAGNOSIS — Z471 Aftercare following joint replacement surgery: Secondary | ICD-10-CM | POA: Diagnosis not present

## 2023-01-28 DIAGNOSIS — R32 Unspecified urinary incontinence: Secondary | ICD-10-CM | POA: Diagnosis not present

## 2023-01-28 DIAGNOSIS — Z7985 Long-term (current) use of injectable non-insulin antidiabetic drugs: Secondary | ICD-10-CM | POA: Diagnosis not present

## 2023-01-28 DIAGNOSIS — Z7982 Long term (current) use of aspirin: Secondary | ICD-10-CM | POA: Diagnosis not present

## 2023-01-28 DIAGNOSIS — Z8616 Personal history of COVID-19: Secondary | ICD-10-CM | POA: Diagnosis not present

## 2023-01-28 DIAGNOSIS — J45909 Unspecified asthma, uncomplicated: Secondary | ICD-10-CM | POA: Diagnosis not present

## 2023-02-03 DIAGNOSIS — E119 Type 2 diabetes mellitus without complications: Secondary | ICD-10-CM | POA: Diagnosis not present

## 2023-02-03 DIAGNOSIS — G5600 Carpal tunnel syndrome, unspecified upper limb: Secondary | ICD-10-CM | POA: Diagnosis not present

## 2023-02-03 DIAGNOSIS — I10 Essential (primary) hypertension: Secondary | ICD-10-CM | POA: Diagnosis not present

## 2023-02-03 DIAGNOSIS — Z96612 Presence of left artificial shoulder joint: Secondary | ICD-10-CM | POA: Diagnosis not present

## 2023-02-03 DIAGNOSIS — R32 Unspecified urinary incontinence: Secondary | ICD-10-CM | POA: Diagnosis not present

## 2023-02-03 DIAGNOSIS — Z8701 Personal history of pneumonia (recurrent): Secondary | ICD-10-CM | POA: Diagnosis not present

## 2023-02-03 DIAGNOSIS — Z7982 Long term (current) use of aspirin: Secondary | ICD-10-CM | POA: Diagnosis not present

## 2023-02-03 DIAGNOSIS — Z8616 Personal history of COVID-19: Secondary | ICD-10-CM | POA: Diagnosis not present

## 2023-02-03 DIAGNOSIS — Z471 Aftercare following joint replacement surgery: Secondary | ICD-10-CM | POA: Diagnosis not present

## 2023-02-03 DIAGNOSIS — J45909 Unspecified asthma, uncomplicated: Secondary | ICD-10-CM | POA: Diagnosis not present

## 2023-02-03 DIAGNOSIS — Z7985 Long-term (current) use of injectable non-insulin antidiabetic drugs: Secondary | ICD-10-CM | POA: Diagnosis not present

## 2023-02-03 DIAGNOSIS — M5430 Sciatica, unspecified side: Secondary | ICD-10-CM | POA: Diagnosis not present

## 2023-02-03 DIAGNOSIS — K581 Irritable bowel syndrome with constipation: Secondary | ICD-10-CM | POA: Diagnosis not present

## 2023-02-04 DIAGNOSIS — I10 Essential (primary) hypertension: Secondary | ICD-10-CM | POA: Diagnosis not present

## 2023-02-04 DIAGNOSIS — Z7982 Long term (current) use of aspirin: Secondary | ICD-10-CM | POA: Diagnosis not present

## 2023-02-04 DIAGNOSIS — G5600 Carpal tunnel syndrome, unspecified upper limb: Secondary | ICD-10-CM | POA: Diagnosis not present

## 2023-02-04 DIAGNOSIS — J45909 Unspecified asthma, uncomplicated: Secondary | ICD-10-CM | POA: Diagnosis not present

## 2023-02-04 DIAGNOSIS — Z8701 Personal history of pneumonia (recurrent): Secondary | ICD-10-CM | POA: Diagnosis not present

## 2023-02-04 DIAGNOSIS — K581 Irritable bowel syndrome with constipation: Secondary | ICD-10-CM | POA: Diagnosis not present

## 2023-02-04 DIAGNOSIS — Z96612 Presence of left artificial shoulder joint: Secondary | ICD-10-CM | POA: Diagnosis not present

## 2023-02-04 DIAGNOSIS — M5430 Sciatica, unspecified side: Secondary | ICD-10-CM | POA: Diagnosis not present

## 2023-02-04 DIAGNOSIS — Z471 Aftercare following joint replacement surgery: Secondary | ICD-10-CM | POA: Diagnosis not present

## 2023-02-04 DIAGNOSIS — Z7985 Long-term (current) use of injectable non-insulin antidiabetic drugs: Secondary | ICD-10-CM | POA: Diagnosis not present

## 2023-02-04 DIAGNOSIS — E119 Type 2 diabetes mellitus without complications: Secondary | ICD-10-CM | POA: Diagnosis not present

## 2023-02-04 DIAGNOSIS — R32 Unspecified urinary incontinence: Secondary | ICD-10-CM | POA: Diagnosis not present

## 2023-02-04 DIAGNOSIS — Z8616 Personal history of COVID-19: Secondary | ICD-10-CM | POA: Diagnosis not present

## 2023-02-05 DIAGNOSIS — Z7985 Long-term (current) use of injectable non-insulin antidiabetic drugs: Secondary | ICD-10-CM | POA: Diagnosis not present

## 2023-02-05 DIAGNOSIS — Z8616 Personal history of COVID-19: Secondary | ICD-10-CM | POA: Diagnosis not present

## 2023-02-05 DIAGNOSIS — K581 Irritable bowel syndrome with constipation: Secondary | ICD-10-CM | POA: Diagnosis not present

## 2023-02-05 DIAGNOSIS — M5430 Sciatica, unspecified side: Secondary | ICD-10-CM | POA: Diagnosis not present

## 2023-02-05 DIAGNOSIS — Z8701 Personal history of pneumonia (recurrent): Secondary | ICD-10-CM | POA: Diagnosis not present

## 2023-02-05 DIAGNOSIS — I10 Essential (primary) hypertension: Secondary | ICD-10-CM | POA: Diagnosis not present

## 2023-02-05 DIAGNOSIS — J45909 Unspecified asthma, uncomplicated: Secondary | ICD-10-CM | POA: Diagnosis not present

## 2023-02-05 DIAGNOSIS — Z96612 Presence of left artificial shoulder joint: Secondary | ICD-10-CM | POA: Diagnosis not present

## 2023-02-05 DIAGNOSIS — Z7982 Long term (current) use of aspirin: Secondary | ICD-10-CM | POA: Diagnosis not present

## 2023-02-05 DIAGNOSIS — E119 Type 2 diabetes mellitus without complications: Secondary | ICD-10-CM | POA: Diagnosis not present

## 2023-02-05 DIAGNOSIS — G5600 Carpal tunnel syndrome, unspecified upper limb: Secondary | ICD-10-CM | POA: Diagnosis not present

## 2023-02-05 DIAGNOSIS — Z471 Aftercare following joint replacement surgery: Secondary | ICD-10-CM | POA: Diagnosis not present

## 2023-02-05 DIAGNOSIS — R32 Unspecified urinary incontinence: Secondary | ICD-10-CM | POA: Diagnosis not present

## 2023-02-08 ENCOUNTER — Encounter: Payer: Medicaid Other | Admitting: Orthopedic Surgery

## 2023-02-09 DIAGNOSIS — Z8701 Personal history of pneumonia (recurrent): Secondary | ICD-10-CM | POA: Diagnosis not present

## 2023-02-09 DIAGNOSIS — M5430 Sciatica, unspecified side: Secondary | ICD-10-CM | POA: Diagnosis not present

## 2023-02-09 DIAGNOSIS — K581 Irritable bowel syndrome with constipation: Secondary | ICD-10-CM | POA: Diagnosis not present

## 2023-02-09 DIAGNOSIS — Z8616 Personal history of COVID-19: Secondary | ICD-10-CM | POA: Diagnosis not present

## 2023-02-09 DIAGNOSIS — I10 Essential (primary) hypertension: Secondary | ICD-10-CM | POA: Diagnosis not present

## 2023-02-09 DIAGNOSIS — Z7985 Long-term (current) use of injectable non-insulin antidiabetic drugs: Secondary | ICD-10-CM | POA: Diagnosis not present

## 2023-02-09 DIAGNOSIS — Z96612 Presence of left artificial shoulder joint: Secondary | ICD-10-CM | POA: Diagnosis not present

## 2023-02-09 DIAGNOSIS — Z471 Aftercare following joint replacement surgery: Secondary | ICD-10-CM | POA: Diagnosis not present

## 2023-02-09 DIAGNOSIS — J45909 Unspecified asthma, uncomplicated: Secondary | ICD-10-CM | POA: Diagnosis not present

## 2023-02-09 DIAGNOSIS — Z7982 Long term (current) use of aspirin: Secondary | ICD-10-CM | POA: Diagnosis not present

## 2023-02-09 DIAGNOSIS — R32 Unspecified urinary incontinence: Secondary | ICD-10-CM | POA: Diagnosis not present

## 2023-02-09 DIAGNOSIS — E119 Type 2 diabetes mellitus without complications: Secondary | ICD-10-CM | POA: Diagnosis not present

## 2023-02-09 DIAGNOSIS — G5600 Carpal tunnel syndrome, unspecified upper limb: Secondary | ICD-10-CM | POA: Diagnosis not present

## 2023-02-10 ENCOUNTER — Other Ambulatory Visit: Payer: Self-pay

## 2023-02-10 ENCOUNTER — Telehealth: Payer: Self-pay

## 2023-02-10 DIAGNOSIS — E119 Type 2 diabetes mellitus without complications: Secondary | ICD-10-CM

## 2023-02-10 DIAGNOSIS — M19012 Primary osteoarthritis, left shoulder: Secondary | ICD-10-CM

## 2023-02-10 DIAGNOSIS — E785 Hyperlipidemia, unspecified: Secondary | ICD-10-CM

## 2023-02-10 DIAGNOSIS — I10 Essential (primary) hypertension: Secondary | ICD-10-CM

## 2023-02-10 MED ORDER — TRULICITY 3 MG/0.5ML ~~LOC~~ SOAJ: 3.0000 mg | SUBCUTANEOUS | 1 refills | Status: AC

## 2023-02-10 MED ORDER — OLMESARTAN MEDOXOMIL-HCTZ 20-12.5 MG PO TABS: 1.0000 | ORAL_TABLET | Freq: Every day | ORAL | 0 refills | Status: DC

## 2023-02-10 NOTE — Telephone Encounter (Signed)
Needing refills

## 2023-02-10 NOTE — Telephone Encounter (Signed)
Refill have been sent.

## 2023-02-11 DIAGNOSIS — Z7982 Long term (current) use of aspirin: Secondary | ICD-10-CM | POA: Diagnosis not present

## 2023-02-11 DIAGNOSIS — I10 Essential (primary) hypertension: Secondary | ICD-10-CM | POA: Diagnosis not present

## 2023-02-11 DIAGNOSIS — Z7985 Long-term (current) use of injectable non-insulin antidiabetic drugs: Secondary | ICD-10-CM | POA: Diagnosis not present

## 2023-02-11 DIAGNOSIS — G5600 Carpal tunnel syndrome, unspecified upper limb: Secondary | ICD-10-CM | POA: Diagnosis not present

## 2023-02-11 DIAGNOSIS — Z96612 Presence of left artificial shoulder joint: Secondary | ICD-10-CM | POA: Diagnosis not present

## 2023-02-11 DIAGNOSIS — R32 Unspecified urinary incontinence: Secondary | ICD-10-CM | POA: Diagnosis not present

## 2023-02-11 DIAGNOSIS — Z471 Aftercare following joint replacement surgery: Secondary | ICD-10-CM | POA: Diagnosis not present

## 2023-02-11 DIAGNOSIS — Z8616 Personal history of COVID-19: Secondary | ICD-10-CM | POA: Diagnosis not present

## 2023-02-11 DIAGNOSIS — M5430 Sciatica, unspecified side: Secondary | ICD-10-CM | POA: Diagnosis not present

## 2023-02-11 DIAGNOSIS — E119 Type 2 diabetes mellitus without complications: Secondary | ICD-10-CM | POA: Diagnosis not present

## 2023-02-11 DIAGNOSIS — J45909 Unspecified asthma, uncomplicated: Secondary | ICD-10-CM | POA: Diagnosis not present

## 2023-02-11 DIAGNOSIS — Z8701 Personal history of pneumonia (recurrent): Secondary | ICD-10-CM | POA: Diagnosis not present

## 2023-02-11 DIAGNOSIS — K581 Irritable bowel syndrome with constipation: Secondary | ICD-10-CM | POA: Diagnosis not present

## 2023-02-16 DIAGNOSIS — J45909 Unspecified asthma, uncomplicated: Secondary | ICD-10-CM | POA: Diagnosis not present

## 2023-02-16 DIAGNOSIS — I10 Essential (primary) hypertension: Secondary | ICD-10-CM | POA: Diagnosis not present

## 2023-02-16 DIAGNOSIS — Z8616 Personal history of COVID-19: Secondary | ICD-10-CM | POA: Diagnosis not present

## 2023-02-16 DIAGNOSIS — Z7982 Long term (current) use of aspirin: Secondary | ICD-10-CM | POA: Diagnosis not present

## 2023-02-16 DIAGNOSIS — Z8701 Personal history of pneumonia (recurrent): Secondary | ICD-10-CM | POA: Diagnosis not present

## 2023-02-16 DIAGNOSIS — K581 Irritable bowel syndrome with constipation: Secondary | ICD-10-CM | POA: Diagnosis not present

## 2023-02-16 DIAGNOSIS — Z7985 Long-term (current) use of injectable non-insulin antidiabetic drugs: Secondary | ICD-10-CM | POA: Diagnosis not present

## 2023-02-16 DIAGNOSIS — G5600 Carpal tunnel syndrome, unspecified upper limb: Secondary | ICD-10-CM | POA: Diagnosis not present

## 2023-02-16 DIAGNOSIS — Z471 Aftercare following joint replacement surgery: Secondary | ICD-10-CM | POA: Diagnosis not present

## 2023-02-16 DIAGNOSIS — M5430 Sciatica, unspecified side: Secondary | ICD-10-CM | POA: Diagnosis not present

## 2023-02-16 DIAGNOSIS — R32 Unspecified urinary incontinence: Secondary | ICD-10-CM | POA: Diagnosis not present

## 2023-02-16 DIAGNOSIS — Z96612 Presence of left artificial shoulder joint: Secondary | ICD-10-CM | POA: Diagnosis not present

## 2023-02-16 DIAGNOSIS — E119 Type 2 diabetes mellitus without complications: Secondary | ICD-10-CM | POA: Diagnosis not present

## 2023-02-17 ENCOUNTER — Other Ambulatory Visit (INDEPENDENT_AMBULATORY_CARE_PROVIDER_SITE_OTHER): Payer: Self-pay

## 2023-02-17 ENCOUNTER — Encounter: Payer: Self-pay | Admitting: Orthopedic Surgery

## 2023-02-17 ENCOUNTER — Ambulatory Visit: Payer: Medicaid Other | Admitting: Orthopedic Surgery

## 2023-02-17 DIAGNOSIS — Z96612 Presence of left artificial shoulder joint: Secondary | ICD-10-CM

## 2023-02-17 DIAGNOSIS — M19012 Primary osteoarthritis, left shoulder: Secondary | ICD-10-CM

## 2023-02-17 NOTE — Progress Notes (Signed)
Orthopaedic Postop Note  Assessment: Megan Salazar is a 62 y.o. female s/p Left Anatomic Total Shoulder Arthroplasty  DOS: 01/07/2023  Plan: Progressing appropriately Radiographs reviewed.  Excellent overall alignment. Passive forward flexion to approximately 100 degrees.  Abduction to approximately 90 degrees. Continue to progress following protocol Gradual transition from passive and active motion, then strengthening Medications as needed. Follow-up in 6 weeks.  Follow-up: Return in about 6 weeks (around 03/31/2023).  XR at next visit: Left shoulder  Subjective:  Chief Complaint  Patient presents with   Shoulder Pain    L/ doing good except when the weather changes.    History of Present Illness: Megan Salazar is a 62 y.o. female who presents following the above stated procedure.  Surgery was approximately 6 weeks ago.  She is improving.  Limited medications.  She is no longer using a sling.  She is doing some exercises at home with home health physical therapy.  No numbness or tingling.  No issues with her incision.  Review of Systems: No fevers or chills No numbness or tingling No Chest Pain No shortness of breath   Objective: There were no vitals taken for this visit.  Physical Exam:  Alert and oriented.  No acute distress.  Left shoulder surgical incision is healing well.  No surrounding erythema or drainage.  No tenderness.  Mild swelling about the left shoulder.  Passive forward flexion about 100 degrees.  Passive abduction to 90 degrees.  Sensation intact in the axillary nerve distribution.  Sensation intact throughout the left hand.  IMAGING: I personally ordered and reviewed the following images:  X-rays of the left shoulder were obtained in clinic today.  These are compared to prior x-rays.  Stemless shoulder arthroplasty remains in good position.  No evidence of subsidence.  Implants are not loosening.  Marker for the glenoid remains in  stable position.  Glenohumeral joint is reduced.  No acute fractures.  Impression: Left stemless anatomic total shoulder arthroplasty in stable position.   Oliver Barre, MD 02/17/2023 11:40 PM

## 2023-02-19 DIAGNOSIS — Z7982 Long term (current) use of aspirin: Secondary | ICD-10-CM | POA: Diagnosis not present

## 2023-02-19 DIAGNOSIS — E119 Type 2 diabetes mellitus without complications: Secondary | ICD-10-CM | POA: Diagnosis not present

## 2023-02-19 DIAGNOSIS — Z7985 Long-term (current) use of injectable non-insulin antidiabetic drugs: Secondary | ICD-10-CM | POA: Diagnosis not present

## 2023-02-19 DIAGNOSIS — J45909 Unspecified asthma, uncomplicated: Secondary | ICD-10-CM | POA: Diagnosis not present

## 2023-02-19 DIAGNOSIS — M5430 Sciatica, unspecified side: Secondary | ICD-10-CM | POA: Diagnosis not present

## 2023-02-19 DIAGNOSIS — I10 Essential (primary) hypertension: Secondary | ICD-10-CM | POA: Diagnosis not present

## 2023-02-19 DIAGNOSIS — K581 Irritable bowel syndrome with constipation: Secondary | ICD-10-CM | POA: Diagnosis not present

## 2023-02-19 DIAGNOSIS — G5600 Carpal tunnel syndrome, unspecified upper limb: Secondary | ICD-10-CM | POA: Diagnosis not present

## 2023-02-19 DIAGNOSIS — Z96612 Presence of left artificial shoulder joint: Secondary | ICD-10-CM | POA: Diagnosis not present

## 2023-02-19 DIAGNOSIS — R32 Unspecified urinary incontinence: Secondary | ICD-10-CM | POA: Diagnosis not present

## 2023-02-19 DIAGNOSIS — Z8616 Personal history of COVID-19: Secondary | ICD-10-CM | POA: Diagnosis not present

## 2023-02-19 DIAGNOSIS — Z471 Aftercare following joint replacement surgery: Secondary | ICD-10-CM | POA: Diagnosis not present

## 2023-02-19 DIAGNOSIS — Z8701 Personal history of pneumonia (recurrent): Secondary | ICD-10-CM | POA: Diagnosis not present

## 2023-02-22 DIAGNOSIS — Z7985 Long-term (current) use of injectable non-insulin antidiabetic drugs: Secondary | ICD-10-CM | POA: Diagnosis not present

## 2023-02-22 DIAGNOSIS — E119 Type 2 diabetes mellitus without complications: Secondary | ICD-10-CM | POA: Diagnosis not present

## 2023-02-22 DIAGNOSIS — Z8616 Personal history of COVID-19: Secondary | ICD-10-CM | POA: Diagnosis not present

## 2023-02-22 DIAGNOSIS — Z471 Aftercare following joint replacement surgery: Secondary | ICD-10-CM | POA: Diagnosis not present

## 2023-02-22 DIAGNOSIS — I10 Essential (primary) hypertension: Secondary | ICD-10-CM | POA: Diagnosis not present

## 2023-02-22 DIAGNOSIS — Z96612 Presence of left artificial shoulder joint: Secondary | ICD-10-CM | POA: Diagnosis not present

## 2023-02-22 DIAGNOSIS — K581 Irritable bowel syndrome with constipation: Secondary | ICD-10-CM | POA: Diagnosis not present

## 2023-02-22 DIAGNOSIS — Z8701 Personal history of pneumonia (recurrent): Secondary | ICD-10-CM | POA: Diagnosis not present

## 2023-02-22 DIAGNOSIS — M5430 Sciatica, unspecified side: Secondary | ICD-10-CM | POA: Diagnosis not present

## 2023-02-22 DIAGNOSIS — Z7982 Long term (current) use of aspirin: Secondary | ICD-10-CM | POA: Diagnosis not present

## 2023-02-22 DIAGNOSIS — R32 Unspecified urinary incontinence: Secondary | ICD-10-CM | POA: Diagnosis not present

## 2023-02-22 DIAGNOSIS — J45909 Unspecified asthma, uncomplicated: Secondary | ICD-10-CM | POA: Diagnosis not present

## 2023-02-22 DIAGNOSIS — G5600 Carpal tunnel syndrome, unspecified upper limb: Secondary | ICD-10-CM | POA: Diagnosis not present

## 2023-02-23 DIAGNOSIS — R32 Unspecified urinary incontinence: Secondary | ICD-10-CM | POA: Diagnosis not present

## 2023-02-23 DIAGNOSIS — Z7982 Long term (current) use of aspirin: Secondary | ICD-10-CM | POA: Diagnosis not present

## 2023-02-23 DIAGNOSIS — Z8701 Personal history of pneumonia (recurrent): Secondary | ICD-10-CM | POA: Diagnosis not present

## 2023-02-23 DIAGNOSIS — E119 Type 2 diabetes mellitus without complications: Secondary | ICD-10-CM | POA: Diagnosis not present

## 2023-02-23 DIAGNOSIS — M5430 Sciatica, unspecified side: Secondary | ICD-10-CM | POA: Diagnosis not present

## 2023-02-23 DIAGNOSIS — G5600 Carpal tunnel syndrome, unspecified upper limb: Secondary | ICD-10-CM | POA: Diagnosis not present

## 2023-02-23 DIAGNOSIS — Z471 Aftercare following joint replacement surgery: Secondary | ICD-10-CM | POA: Diagnosis not present

## 2023-02-23 DIAGNOSIS — J45909 Unspecified asthma, uncomplicated: Secondary | ICD-10-CM | POA: Diagnosis not present

## 2023-02-23 DIAGNOSIS — Z96612 Presence of left artificial shoulder joint: Secondary | ICD-10-CM | POA: Diagnosis not present

## 2023-02-23 DIAGNOSIS — Z7985 Long-term (current) use of injectable non-insulin antidiabetic drugs: Secondary | ICD-10-CM | POA: Diagnosis not present

## 2023-02-23 DIAGNOSIS — Z8616 Personal history of COVID-19: Secondary | ICD-10-CM | POA: Diagnosis not present

## 2023-02-23 DIAGNOSIS — I10 Essential (primary) hypertension: Secondary | ICD-10-CM | POA: Diagnosis not present

## 2023-02-23 DIAGNOSIS — K581 Irritable bowel syndrome with constipation: Secondary | ICD-10-CM | POA: Diagnosis not present

## 2023-03-01 DIAGNOSIS — K581 Irritable bowel syndrome with constipation: Secondary | ICD-10-CM | POA: Diagnosis not present

## 2023-03-01 DIAGNOSIS — I10 Essential (primary) hypertension: Secondary | ICD-10-CM | POA: Diagnosis not present

## 2023-03-01 DIAGNOSIS — G5600 Carpal tunnel syndrome, unspecified upper limb: Secondary | ICD-10-CM | POA: Diagnosis not present

## 2023-03-01 DIAGNOSIS — Z7982 Long term (current) use of aspirin: Secondary | ICD-10-CM | POA: Diagnosis not present

## 2023-03-01 DIAGNOSIS — Z96612 Presence of left artificial shoulder joint: Secondary | ICD-10-CM | POA: Diagnosis not present

## 2023-03-01 DIAGNOSIS — J45909 Unspecified asthma, uncomplicated: Secondary | ICD-10-CM | POA: Diagnosis not present

## 2023-03-01 DIAGNOSIS — M5430 Sciatica, unspecified side: Secondary | ICD-10-CM | POA: Diagnosis not present

## 2023-03-01 DIAGNOSIS — Z7985 Long-term (current) use of injectable non-insulin antidiabetic drugs: Secondary | ICD-10-CM | POA: Diagnosis not present

## 2023-03-01 DIAGNOSIS — R32 Unspecified urinary incontinence: Secondary | ICD-10-CM | POA: Diagnosis not present

## 2023-03-01 DIAGNOSIS — E119 Type 2 diabetes mellitus without complications: Secondary | ICD-10-CM | POA: Diagnosis not present

## 2023-03-01 DIAGNOSIS — Z8701 Personal history of pneumonia (recurrent): Secondary | ICD-10-CM | POA: Diagnosis not present

## 2023-03-01 DIAGNOSIS — Z8616 Personal history of COVID-19: Secondary | ICD-10-CM | POA: Diagnosis not present

## 2023-03-01 DIAGNOSIS — Z471 Aftercare following joint replacement surgery: Secondary | ICD-10-CM | POA: Diagnosis not present

## 2023-03-03 DIAGNOSIS — G5600 Carpal tunnel syndrome, unspecified upper limb: Secondary | ICD-10-CM | POA: Diagnosis not present

## 2023-03-03 DIAGNOSIS — R32 Unspecified urinary incontinence: Secondary | ICD-10-CM | POA: Diagnosis not present

## 2023-03-03 DIAGNOSIS — J45909 Unspecified asthma, uncomplicated: Secondary | ICD-10-CM | POA: Diagnosis not present

## 2023-03-03 DIAGNOSIS — Z96612 Presence of left artificial shoulder joint: Secondary | ICD-10-CM | POA: Diagnosis not present

## 2023-03-03 DIAGNOSIS — Z8616 Personal history of COVID-19: Secondary | ICD-10-CM | POA: Diagnosis not present

## 2023-03-03 DIAGNOSIS — Z7982 Long term (current) use of aspirin: Secondary | ICD-10-CM | POA: Diagnosis not present

## 2023-03-03 DIAGNOSIS — M5430 Sciatica, unspecified side: Secondary | ICD-10-CM | POA: Diagnosis not present

## 2023-03-03 DIAGNOSIS — E119 Type 2 diabetes mellitus without complications: Secondary | ICD-10-CM | POA: Diagnosis not present

## 2023-03-03 DIAGNOSIS — Z471 Aftercare following joint replacement surgery: Secondary | ICD-10-CM | POA: Diagnosis not present

## 2023-03-03 DIAGNOSIS — K581 Irritable bowel syndrome with constipation: Secondary | ICD-10-CM | POA: Diagnosis not present

## 2023-03-03 DIAGNOSIS — I10 Essential (primary) hypertension: Secondary | ICD-10-CM | POA: Diagnosis not present

## 2023-03-03 DIAGNOSIS — Z8701 Personal history of pneumonia (recurrent): Secondary | ICD-10-CM | POA: Diagnosis not present

## 2023-03-03 DIAGNOSIS — Z7985 Long-term (current) use of injectable non-insulin antidiabetic drugs: Secondary | ICD-10-CM | POA: Diagnosis not present

## 2023-03-08 DIAGNOSIS — J45909 Unspecified asthma, uncomplicated: Secondary | ICD-10-CM | POA: Diagnosis not present

## 2023-03-08 DIAGNOSIS — Z7982 Long term (current) use of aspirin: Secondary | ICD-10-CM | POA: Diagnosis not present

## 2023-03-08 DIAGNOSIS — G5600 Carpal tunnel syndrome, unspecified upper limb: Secondary | ICD-10-CM | POA: Diagnosis not present

## 2023-03-08 DIAGNOSIS — Z96612 Presence of left artificial shoulder joint: Secondary | ICD-10-CM | POA: Diagnosis not present

## 2023-03-08 DIAGNOSIS — I10 Essential (primary) hypertension: Secondary | ICD-10-CM | POA: Diagnosis not present

## 2023-03-08 DIAGNOSIS — Z471 Aftercare following joint replacement surgery: Secondary | ICD-10-CM | POA: Diagnosis not present

## 2023-03-08 DIAGNOSIS — Z8616 Personal history of COVID-19: Secondary | ICD-10-CM | POA: Diagnosis not present

## 2023-03-08 DIAGNOSIS — Z8701 Personal history of pneumonia (recurrent): Secondary | ICD-10-CM | POA: Diagnosis not present

## 2023-03-08 DIAGNOSIS — R32 Unspecified urinary incontinence: Secondary | ICD-10-CM | POA: Diagnosis not present

## 2023-03-08 DIAGNOSIS — K581 Irritable bowel syndrome with constipation: Secondary | ICD-10-CM | POA: Diagnosis not present

## 2023-03-08 DIAGNOSIS — M5430 Sciatica, unspecified side: Secondary | ICD-10-CM | POA: Diagnosis not present

## 2023-03-08 DIAGNOSIS — Z7985 Long-term (current) use of injectable non-insulin antidiabetic drugs: Secondary | ICD-10-CM | POA: Diagnosis not present

## 2023-03-08 DIAGNOSIS — E119 Type 2 diabetes mellitus without complications: Secondary | ICD-10-CM | POA: Diagnosis not present

## 2023-03-09 ENCOUNTER — Telehealth: Payer: Self-pay | Admitting: Orthopedic Surgery

## 2023-03-09 DIAGNOSIS — Z96612 Presence of left artificial shoulder joint: Secondary | ICD-10-CM

## 2023-03-09 NOTE — Telephone Encounter (Signed)
Spoke with Megan Salazar who states pt will be ready to transition to OT starting the week of 03/14/23. Order placed for transition.

## 2023-03-09 NOTE — Telephone Encounter (Signed)
[name] pt - Mallory w/[facility] OT [phone] lvm stating she is trying to get clarifications for this patient.  She would like a call back.

## 2023-03-09 NOTE — Telephone Encounter (Signed)
DR. Fawn Kirk called lvm stating Carmalita needs extended OT   Call Mallory back at [phone]

## 2023-03-11 ENCOUNTER — Other Ambulatory Visit: Payer: Self-pay | Admitting: Family Medicine

## 2023-03-11 ENCOUNTER — Telehealth: Payer: Self-pay | Admitting: Orthopedic Surgery

## 2023-03-11 DIAGNOSIS — K581 Irritable bowel syndrome with constipation: Secondary | ICD-10-CM | POA: Diagnosis not present

## 2023-03-11 DIAGNOSIS — G5600 Carpal tunnel syndrome, unspecified upper limb: Secondary | ICD-10-CM | POA: Diagnosis not present

## 2023-03-11 DIAGNOSIS — I10 Essential (primary) hypertension: Secondary | ICD-10-CM | POA: Diagnosis not present

## 2023-03-11 DIAGNOSIS — Z96612 Presence of left artificial shoulder joint: Secondary | ICD-10-CM | POA: Diagnosis not present

## 2023-03-11 DIAGNOSIS — Z7985 Long-term (current) use of injectable non-insulin antidiabetic drugs: Secondary | ICD-10-CM | POA: Diagnosis not present

## 2023-03-11 DIAGNOSIS — M5430 Sciatica, unspecified side: Secondary | ICD-10-CM | POA: Diagnosis not present

## 2023-03-11 DIAGNOSIS — Z7982 Long term (current) use of aspirin: Secondary | ICD-10-CM | POA: Diagnosis not present

## 2023-03-11 DIAGNOSIS — Z471 Aftercare following joint replacement surgery: Secondary | ICD-10-CM | POA: Diagnosis not present

## 2023-03-11 DIAGNOSIS — J45909 Unspecified asthma, uncomplicated: Secondary | ICD-10-CM | POA: Diagnosis not present

## 2023-03-11 DIAGNOSIS — R32 Unspecified urinary incontinence: Secondary | ICD-10-CM | POA: Diagnosis not present

## 2023-03-11 DIAGNOSIS — Z8616 Personal history of COVID-19: Secondary | ICD-10-CM | POA: Diagnosis not present

## 2023-03-11 DIAGNOSIS — E119 Type 2 diabetes mellitus without complications: Secondary | ICD-10-CM | POA: Diagnosis not present

## 2023-03-11 DIAGNOSIS — Z8701 Personal history of pneumonia (recurrent): Secondary | ICD-10-CM | POA: Diagnosis not present

## 2023-03-11 NOTE — Telephone Encounter (Signed)
[name]  Patient called and left voicemail she wants to start driving herself and PT told her it would have to be approved by [name]. She said I WANT TO DRIVE MYSELF!!!   Please call her back at [phone]

## 2023-03-11 NOTE — Telephone Encounter (Signed)
Gave pt information from provider, she verbalized understanding.

## 2023-03-23 ENCOUNTER — Ambulatory Visit (HOSPITAL_COMMUNITY): Payer: Medicaid Other | Admitting: Occupational Therapy

## 2023-03-30 ENCOUNTER — Encounter (HOSPITAL_COMMUNITY): Payer: Self-pay | Admitting: Occupational Therapy

## 2023-03-30 ENCOUNTER — Other Ambulatory Visit: Payer: Self-pay

## 2023-03-30 ENCOUNTER — Ambulatory Visit (HOSPITAL_COMMUNITY): Payer: Medicaid Other | Attending: Orthopedic Surgery | Admitting: Occupational Therapy

## 2023-03-30 DIAGNOSIS — Z96612 Presence of left artificial shoulder joint: Secondary | ICD-10-CM | POA: Diagnosis not present

## 2023-03-30 DIAGNOSIS — R29898 Other symptoms and signs involving the musculoskeletal system: Secondary | ICD-10-CM | POA: Diagnosis not present

## 2023-03-30 DIAGNOSIS — M25512 Pain in left shoulder: Secondary | ICD-10-CM | POA: Diagnosis not present

## 2023-03-30 DIAGNOSIS — M25612 Stiffness of left shoulder, not elsewhere classified: Secondary | ICD-10-CM | POA: Diagnosis not present

## 2023-03-30 NOTE — Patient Instructions (Signed)
1) SHOULDER: Flexion On Table   Place hands on towel placed on table, elbows straight. Lean forward with you upper body, pushing towel away from body.  __10_ reps per set, __3_ sets per day  2) Abduction (Passive)   With arm out to side, resting on towel placed on table with palm DOWN, keeping trunk away from table, lean to the side while pushing towel away from body.  Repeat __10__ times. Do __3__ sessions per day.  Copyright  VHI. All rights reserved.     3) Internal Rotation (Assistive)   Seated with elbow bent at right angle and held against side, slide arm on table surface in an inward arc keeping elbow anchored in place. Repeat __10__ times. Do ___3_ sessions per day. Activity: Use this motion to brush crumbs off the table.  Copyright  VHI. All rights reserved.   

## 2023-03-30 NOTE — Therapy (Addendum)
 OUTPATIENT OCCUPATIONAL THERAPY ORTHO EVALUATION  Patient Name: Megan Salazar MRN: [id number] DOB:10-18-1960, 63 y.o., female Today's Date: 03/30/2023   END OF SESSION:  OT End of Session - 03/30/23 1202     Visit Number 1    Number of Visits 16    Date for OT Re-Evaluation 05/29/23   progress note 04/29/23   Authorization Type [place] Medicaid    Authorization Time Period requesting visits    OT Start Time 1025   pt arrived late   OT Stop Time 1105    OT Time Calculation (min) 40 min    Activity Tolerance Patient tolerated treatment well    Behavior During Therapy WFL for tasks assessed/performed             Past Medical History:  Diagnosis Date   Asthma    Bone spur    Carpal tunnel syndrome    Complication of anesthesia    Patient didnt wake up for 24 hours after anesthesia   Diabetes (HCC)    HTN (hypertension)    IBS (irritable bowel syndrome)    chronic diarrhea   Paresthesias    left side   Pneumonia 03/2020   Sciatica    Past Surgical History:  Procedure Laterality Date   ABDOMINAL HYSTERECTOMY     partial   CARPAL TUNNEL RELEASE     bilat, [place]   CESAREAN SECTION     COLONOSCOPY  07/18/2010   Fields-SIMPLE ADENOMA (Next colonoscopy  06/2020)   COLONOSCOPY WITH PROPOFOL  N/A 08/20/2020   Procedure: COLONOSCOPY WITH PROPOFOL ;  Surgeon: Cindie Carlin POUR, DO;  Location: [facility];  Service: Endoscopy;  Laterality: N/A;  9:00am   TOTAL SHOULDER ARTHROPLASTY Left 01/07/2023   Procedure: LEFT TOTAL SHOULDER ARTHROPLASTY;  Surgeon: Onesimo [name] DELENA, MD;  Location: [facility] ORS;  Service: Orthopedics;  Laterality: Left;   Patient Active Problem List   Diagnosis Date Noted   Arthritis of left glenohumeral joint 01/07/2023   Preoperative clearance 12/09/2022   Type 2 diabetes mellitus (HCC) 06/19/2022   Hypertension 06/02/2022   Encounter for routine adult physical exam with abnormal findings 05/19/2022   History of adenomatous polyp of colon 07/04/2020    Acute hypoxemic respiratory failure due to COVID-19 [facility]) 03/30/2020   Hepatic cirrhosis (HCC) 01/19/2018   Colon cancer screening 01/19/2018   Chronic diarrhea 12/31/2011    PCP: Hilario Terry Wilhelmena Lloyd, FNP  REFERRING PROVIDER: Dr. [name] Onesimo  ONSET DATE: 01/07/23  REFERRING DIAG: S03.387 (ICD-10-CM) - Status post total replacement of left shoulder   THERAPY DIAG:  Acute pain of left shoulder - Plan: Ot plan of care cert/re-cert  Stiffness of left shoulder, not elsewhere classified - Plan: Ot plan of care cert/re-cert  Other symptoms and signs involving the musculoskeletal system - Plan: Ot plan of care cert/re-cert  Rationale for Evaluation and Treatment: Rehabilitation  SUBJECTIVE:   SUBJECTIVE STATEMENT: S: I haven't had any pain since the surgery.  Pt accompanied by: self  PERTINENT HISTORY: Pt is a 63 y/o female s/p left TSA on 01/07/23. Pt received HH therapy and has transitioned to OP therapy.   PRECAUTIONS: Shoulder  WEIGHT BEARING RESTRICTIONS: No  PAIN:  Are you having pain? No  FALLS: Has patient fallen in last 6 months? No  PLOF: Independent  PATIENT GOALS: To use the LUE for ADLs.   NEXT MD VISIT: 03/31/23  OBJECTIVE:   HAND DOMINANCE: Ambidextrous  ADLs: Overall ADLs: Pt reports reaching overhead and behind back is difficult. Has  difficulty with dressing, bathing, meal preparation, and household tasks. Cannot complete lifting tasks yet, pt has been careful with mobility of the shoulder during ADLs.    FUNCTIONAL OUTCOME MEASURES: Quick Dash: 29.55  UPPER EXTREMITY ROM:       Assessed in sitting, er/IR adducted  Active ROM Left eval  Shoulder flexion 65  Shoulder abduction 50  Shoulder internal rotation 90  Shoulder external rotation 51  (Blank rows = not tested)  Assessed in supine, er/IR adducted  Active ROM Left eval  Shoulder flexion 111  Shoulder abduction 80  Shoulder internal rotation 90  Shoulder external rotation 39   (Blank rows = not tested)    UPPER EXTREMITY MMT:     Assessed in sitting, er/IR adducted  MMT Left eval  Shoulder flexion 3-/5  Shoulder abduction 3-/5  Shoulder internal rotation 3/5  Shoulder external rotation 3/5  (Blank rows = not tested)   EDEMA: None  COGNITION: Overall cognitive status: Within functional limits for tasks assessed  OBSERVATIONS: min/mod fascial restrictions along upper arm and anterior shoulder regions   TODAY'S TREATMENT:                                                                                                                              DATE: N/A-eval only    PATIENT EDUCATION: Education details: table slides Person educated: Patient Education method: Explanation, Demonstration, and Handouts Education comprehension: verbalized understanding and returned demonstration  HOME EXERCISE PROGRAM: Eval: table slides  GOALS: Goals reviewed with patient? Yes   SHORT TERM GOALS: Target date: 04/29/23  Pt will be provided with and educated on HEP to improve mobility in LUE required for use during ADL completion.   Goal status: INITIAL  2.  Pt will increase LUE P/ROM by 40+ degrees to improve ability to use LUE during dressing tasks with minimal compensatory techniques.   Goal status: INITIAL  3.  Pt will increase LUE strength to 3+/5 to improve ability to reach for items at waist to chest height during bathing and grooming tasks.   Goal status: INITIAL    LONG TERM GOALS: Target date: 05/28/23  Pt will decrease pain in LUE to 2/10 or less to improve ability to sleep for 2+ consecutive hours without waking due to pain.   Goal status: INITIAL  2.  Pt will decrease LUE fascial restrictions to trace amounts or less to improve mobility required for functional reaching tasks.   Goal status: INITIAL  3.  Pt will increase LUE A/ROM by 40+ degrees to improve ability to use LUE when reaching overhead or behind back during dressing and  bathing tasks.   Goal status: INITIAL  4.  Pt will increase LUE strength to 4/5 or greater to improve ability to use LUE when lifting or carrying items during meal preparation/housework/yardwork tasks.   Goal status: INITIAL   ASSESSMENT:  CLINICAL IMPRESSION: Patient is a 63 y.o. female who was seen today for occupational  therapy evaluation s/p TSA. Pt presents with increased pain and fascial restrictions, decreased ROM, strength, and functional use of the LUE. Pt with <50% ROM, significant difficulty with reaching tasks. Pt with fixed income, recommended 2x/week however pt only able to attend 1x/week right now. Will have to re-evaluate if able to come 2x/week at a later time.    PERFORMANCE DEFICITS: in functional skills including in functional skills including ADLs, IADLs, coordination, tone, ROM, strength, pain, fascial restrictions, muscle spasms, and UE functional use  IMPAIRMENTS: are limiting patient from ADLs, IADLs, rest and sleep, and leisure.   COMORBIDITIES: has no other co-morbidities that affects occupational performance. Patient will benefit from skilled OT to address above impairments and improve overall function.  MODIFICATION OR ASSISTANCE TO COMPLETE EVALUATION: No modification of tasks or assist necessary to complete an evaluation.  OT OCCUPATIONAL PROFILE AND HISTORY: Problem focused assessment: Including review of records relating to presenting problem.  CLINICAL DECISION MAKING: LOW - limited treatment options, no task modification necessary  REHAB POTENTIAL: Good  EVALUATION COMPLEXITY: Low      PLAN:  OT FREQUENCY: 2x/week  OT DURATION: 8 weeks  PLANNED INTERVENTIONS: 97168 OT Re-evaluation, 97535 self care/ADL training, 02889 therapeutic exercise, 97530 therapeutic activity, 97140 manual therapy, 97035 ultrasound, patient/family education, and DME and/or AE instructions  RECOMMENDED OTHER SERVICES: None at this time  CONSULTED AND AGREED WITH  PLAN OF CARE: Patient  PLAN FOR NEXT SESSION: Follow up on HEP, initiate manual techniques and passive stretching, AA/ROM progressing to A/ROM   Sonny Cory, OTR/L  [phone] 03/30/2023, 12:05 PM    Managed Medicaid Authorization Request  Visit Dx Codes: F74.487, M25.612, R29.898  Functional Tool Score: DASH: 29.55  For all possible CPT codes, reference the Planned Interventions line above.     Check all conditions that are expected to impact treatment: {Conditions expected to impact treatment:None of these apply   If treatment provided at initial evaluation, no treatment charged due to lack of authorization.

## 2023-03-31 ENCOUNTER — Ambulatory Visit: Payer: Medicaid Other | Admitting: Orthopedic Surgery

## 2023-03-31 ENCOUNTER — Other Ambulatory Visit (INDEPENDENT_AMBULATORY_CARE_PROVIDER_SITE_OTHER): Payer: Self-pay

## 2023-03-31 ENCOUNTER — Encounter: Payer: Self-pay | Admitting: Orthopedic Surgery

## 2023-03-31 DIAGNOSIS — Z96612 Presence of left artificial shoulder joint: Secondary | ICD-10-CM

## 2023-03-31 NOTE — Progress Notes (Signed)
 Orthopaedic Postop Note  Assessment: Megan Salazar is a 63 y.o. female s/p Left Anatomic Total Shoulder Arthroplasty  DOS: 01/07/2023  Plan: Megan Salazar is doing well overall.  She has no pain.  She does have stiffness in the left shoulder, but I think this will continue to improve.  I have advised her to continue to push, to work on her range of motion.  She states understanding.  Continue to work with outpatient physical therapy.  Okay to return to the gym, with limited activities but can ride the bike and walk on a treadmill.  Okay for her to return to the pool, which I think will be helpful for her shoulder.  I would like see her back in approximately 3 months.  Follow-up: Return in about 3 months (around 06/29/2023).  XR at next visit: Left shoulder  Subjective:  Chief Complaint  Patient presents with   Routine Post Op    L TSA DOS: 01/07/23    History of Present Illness: Megan Salazar is a 63 y.o. female who presents following the above stated procedure.  Surgery was approximately 3 months ago.  She is improving.  She started working with outpatient physical therapy.  She was told that she is progressing appropriately.  She has no pain in her shoulder.  She is working on passive and active range of motion.  She is struggling to get her arm beyond the level of her shoulder.  No numbness and tingling.  Occasional shooting pains with stretching activities.   Review of Systems: No fevers or chills No numbness or tingling No Chest Pain No shortness of breath   Objective: There were no vitals taken for this visit.  Physical Exam:  Alert and oriented.  No acute distress.  Left shoulder surgical incision is healing well.  No surrounding erythema or drainage.  Active forward flexion to 90 degrees.  Active abduction to 90 degrees.  Passive forward flexion, in the supine position 130 degrees.  Sensation intact in the axillary nerve distribution.  Sensation intact  throughout the left hand.  IMAGING: I personally ordered and reviewed the following images:  The left shoulder were obtained in clinic today.  These are compared to available x-rays.  Shoulder arthroplasty remains in unchanged position.  There has been no subsidence.  No loosening of the implants.  No periprosthetic lucency.  Alfredo Collymore for the glenoid remains in unchanged position.  No bony lesions.  Impression: Stable left stemless anatomic shoulder arthroplasty without subsidence   [name] LABOR Horde, MD 03/31/2023 10:43 AM

## 2023-04-07 ENCOUNTER — Encounter (HOSPITAL_COMMUNITY): Payer: Medicaid Other | Admitting: Occupational Therapy

## 2023-04-08 ENCOUNTER — Ambulatory Visit (HOSPITAL_COMMUNITY): Payer: Medicaid Other | Admitting: Occupational Therapy

## 2023-04-08 ENCOUNTER — Encounter (HOSPITAL_COMMUNITY): Payer: Self-pay | Admitting: Occupational Therapy

## 2023-04-08 DIAGNOSIS — M25512 Pain in left shoulder: Secondary | ICD-10-CM

## 2023-04-08 DIAGNOSIS — R29898 Other symptoms and signs involving the musculoskeletal system: Secondary | ICD-10-CM | POA: Diagnosis not present

## 2023-04-08 DIAGNOSIS — M25612 Stiffness of left shoulder, not elsewhere classified: Secondary | ICD-10-CM | POA: Diagnosis not present

## 2023-04-08 DIAGNOSIS — Z96612 Presence of left artificial shoulder joint: Secondary | ICD-10-CM | POA: Diagnosis not present

## 2023-04-08 NOTE — Patient Instructions (Signed)

## 2023-04-08 NOTE — Therapy (Signed)
OUTPATIENT OCCUPATIONAL THERAPY ORTHO TREATMENT NOTE  Patient Name: Megan Salazar MRN: [id number] DOB:1960/05/12, 63 y.o., female Today's Date: 04/08/2023   END OF SESSION:  OT End of Session - 04/08/23 0938     Visit Number 2    Number of Visits 16    Date for OT Re-Evaluation 05/29/23   progress note 04/29/23   Authorization Type HB Medicaid    Authorization Time Period 5 visits approved (03/30/23-05/28/23)    OT Start Time 4098    OT Stop Time 1191    OT Time Calculation (min) 46 min    Activity Tolerance Patient tolerated treatment well    Behavior During Therapy [facility] for tasks assessed/performed             Past Medical History:  Diagnosis Date   Asthma    Bone spur    Carpal tunnel syndrome    Complication of anesthesia    Patient didnt wake up for 24 hours after anesthesia   Diabetes (HCC)    HTN (hypertension)    IBS (irritable bowel syndrome)    chronic diarrhea   Paresthesias    left side   Pneumonia 03/2020   Sciatica    Past Surgical History:  Procedure Laterality Date   ABDOMINAL HYSTERECTOMY     partial   CARPAL TUNNEL RELEASE     bilat, [place]   CESAREAN SECTION     COLONOSCOPY  07/18/2010   Fields-SIMPLE ADENOMA (Next colonoscopy  06/2020)   COLONOSCOPY WITH PROPOFOL N/A 08/20/2020   Procedure: COLONOSCOPY WITH PROPOFOL;  Surgeon: Lanelle Bal, DO;  [facility];  Service: Endoscopy;  Laterality: N/A;  9:00am   TOTAL SHOULDER ARTHROPLASTY Left 01/07/2023   Procedure: LEFT TOTAL SHOULDER ARTHROPLASTY;  Surgeon: Oliver Barre, MD;  Location: [facility];  Service: Orthopedics;  Laterality: Left;   Patient Active Problem List   Diagnosis Date Noted   Arthritis of left glenohumeral joint 01/07/2023   Preoperative clearance 12/09/2022   Type 2 diabetes mellitus (HCC) 06/19/2022   Hypertension 06/02/2022   Encounter for routine adult physical exam with abnormal findings 05/19/2022   History of adenomatous polyp of colon 07/04/2020    Acute hypoxemic respiratory failure due to COVID-19 [facility]) 03/30/2020   Hepatic cirrhosis (HCC) 01/19/2018   Colon cancer screening 01/19/2018   Chronic diarrhea 12/31/2011    PCP: Rica Records, FNP  REFERRING PROVIDER: Dr. Thane Edu  ONSET DATE: 01/07/23  REFERRING DIAG: Y78.295 (ICD-10-CM) - Status post total replacement of left shoulder   THERAPY DIAG:  Acute pain of left shoulder  Stiffness of left shoulder, not elsewhere classified  Other symptoms and signs involving the musculoskeletal system  Rationale for Evaluation and Treatment: Rehabilitation  SUBJECTIVE:   SUBJECTIVE STATEMENT: S: The weather is making me sore. Pt accompanied by: self  PERTINENT HISTORY: Pt is a 63 y/o female s/p left TSA on 01/07/23. Pt received HH therapy and has transitioned to OP therapy.   PRECAUTIONS: Shoulder  WEIGHT BEARING RESTRICTIONS: No  PAIN:  Are you having pain? No  FALLS: Has patient fallen in last 6 months? No  PLOF: Independent  PATIENT GOALS: To use the LUE for ADLs.   NEXT MD VISIT: 03/31/23  OBJECTIVE:   HAND DOMINANCE: Ambidextrous  ADLs: Overall ADLs: Pt reports reaching overhead and behind back is difficult. Has difficulty with dressing, bathing, meal preparation, and household tasks. Cannot complete lifting tasks yet, pt has been careful with mobility of the shoulder during ADLs.  FUNCTIONAL OUTCOME MEASURES: Quick Dash: 29.55  UPPER EXTREMITY ROM:       Assessed in sitting, er/IR adducted  Active ROM Left eval  Shoulder flexion 65  Shoulder abduction 50  Shoulder internal rotation 90  Shoulder external rotation 51  (Blank rows = not tested)  Assessed in supine, er/IR adducted  Active ROM Left eval  Shoulder flexion 111  Shoulder abduction 80  Shoulder internal rotation 90  Shoulder external rotation 39  (Blank rows = not tested)    UPPER EXTREMITY MMT:     Assessed in sitting, er/IR adducted  MMT Left eval   Shoulder flexion 3-/5  Shoulder abduction 3-/5  Shoulder internal rotation 3/5  Shoulder external rotation 3/5  (Blank rows = not tested)   EDEMA: None  COGNITION: Overall cognitive status: Within functional limits for tasks assessed  OBSERVATIONS: min/mod fascial restrictions along upper arm and anterior shoulder regions   TODAY'S TREATMENT:                                                                                                                              DATE:   04/08/23 -Manual Therapy: myofascial release and trigger point applied to biceps, deltoid, trapezius, and scapular region in order to reduce fascial restrictions and pain, as well as improve ROM -P/ROM: flexion, abduction, er/IR, x10 -AA/ROM: supine, flexion, abduction, protraction, horizontal abduction, er/IR, x10 -Reviewed table slides and exercises from [name]   PATIENT EDUCATION: Education details: AA/ROM Person educated: Patient Education method: Programmer, multimedia, Facilities manager, and Handouts Education comprehension: verbalized understanding and returned demonstration  HOME EXERCISE PROGRAM: Eval: table slides 1/16: AA/ROM  GOALS: Goals reviewed with patient? Yes   SHORT TERM GOALS: Target date: 04/29/23  Pt will be provided with and educated on HEP to improve mobility in LUE required for use during ADL completion.   Goal status: IN PROGRESS  2.  Pt will increase LUE P/ROM by 40+ degrees to improve ability to use LUE during dressing tasks with minimal compensatory techniques.   Goal status: IN PROGRESS  3.  Pt will increase LUE strength to 3+/5 to improve ability to reach for items at waist to chest height during bathing and grooming tasks.   Goal status: IN PROGRESS    LONG TERM GOALS: Target date: 05/28/23  Pt will decrease pain in LUE to 2/10 or less to improve ability to sleep for 2+ consecutive hours without waking due to pain.   Goal status: IN PROGRESS  2.  Pt will decrease LUE  fascial restrictions to trace amounts or less to improve mobility required for functional reaching tasks.   Goal status: IN PROGRESS  3.  Pt will increase LUE A/ROM by 40+ degrees to improve ability to use LUE when reaching overhead or behind back during dressing and bathing tasks.   Goal status: IN PROGRESS  4.  Pt will increase LUE strength to 4/5 or greater to improve ability to use LUE when lifting or  carrying items during meal preparation/housework/yardwork tasks.   Goal status: IN PROGRESS   ASSESSMENT:  CLINICAL IMPRESSION: Pt presenting to first treatment session with increased stiffness and soreness. She is very limited with P/ROM and AA/ROM, having increased pain and hard stops at approximately 50-60% of full ROM. Due to pain and fatigue, multiple rest breaks required. OT providing hands on assist as needed, as well as verbal and tactile cuing for positioning and technique throughout session.   PERFORMANCE DEFICITS: in functional skills including in functional skills including ADLs, IADLs, coordination, tone, ROM, strength, pain, fascial restrictions, muscle spasms, and UE functional use   PLAN:  OT FREQUENCY: 2x/week  OT DURATION: 8 weeks  PLANNED INTERVENTIONS: 97168 OT Re-evaluation, 97535 self care/ADL training, 41324 therapeutic exercise, 97530 therapeutic activity, 97140 manual therapy, 97035 ultrasound, patient/family education, and DME and/or AE instructions  RECOMMENDED OTHER SERVICES: None at this time  CONSULTED AND AGREED WITH PLAN OF CARE: Patient  PLAN FOR NEXT SESSION: Follow up on HEP, initiate manual techniques and passive stretching, AA/ROM progressing to A/ROM   Trish Mage, OTR/L  [phone] 04/08/2023, 2:51 PM

## 2023-04-12 ENCOUNTER — Ambulatory Visit: Payer: Medicaid Other | Admitting: Family Medicine

## 2023-04-13 ENCOUNTER — Other Ambulatory Visit: Payer: Self-pay | Admitting: Family Medicine

## 2023-04-13 ENCOUNTER — Encounter (HOSPITAL_COMMUNITY): Payer: Self-pay | Admitting: Occupational Therapy

## 2023-04-13 ENCOUNTER — Ambulatory Visit (INDEPENDENT_AMBULATORY_CARE_PROVIDER_SITE_OTHER): Payer: Medicaid Other | Admitting: Occupational Therapy

## 2023-04-13 DIAGNOSIS — M25512 Pain in left shoulder: Secondary | ICD-10-CM

## 2023-04-13 DIAGNOSIS — M25612 Stiffness of left shoulder, not elsewhere classified: Secondary | ICD-10-CM | POA: Diagnosis not present

## 2023-04-13 DIAGNOSIS — I10 Essential (primary) hypertension: Secondary | ICD-10-CM

## 2023-04-13 DIAGNOSIS — R29898 Other symptoms and signs involving the musculoskeletal system: Secondary | ICD-10-CM | POA: Diagnosis not present

## 2023-04-13 DIAGNOSIS — Z96612 Presence of left artificial shoulder joint: Secondary | ICD-10-CM | POA: Diagnosis not present

## 2023-04-13 NOTE — Therapy (Signed)
OUTPATIENT OCCUPATIONAL THERAPY ORTHO TREATMENT NOTE  Patient Name: Megan Salazar MRN: [id number] DOB:18-Mar-1961, 63 y.o., female Today's Date: 04/13/2023   END OF SESSION:  OT End of Session - 04/13/23 0848     Visit Number 3    Number of Visits 16    Date for OT Re-Evaluation 05/29/23   progress note 04/29/23   Authorization Type HB Medicaid    Authorization Time Period 5 visits approved (03/30/23-05/28/23)    Authorization - Visit Number 2    Authorization - Number of Visits 5    OT Start Time 0807    OT Stop Time 0847    OT Time Calculation (min) 40 min    Activity Tolerance Patient tolerated treatment well    Behavior During Therapy WFL for tasks assessed/performed             Past Medical History:  Diagnosis Date   Asthma    Bone spur    Carpal tunnel syndrome    Complication of anesthesia    Patient didnt wake up for 24 hours after anesthesia   Diabetes (HCC)    HTN (hypertension)    IBS (irritable bowel syndrome)    chronic diarrhea   Paresthesias    left side   Pneumonia 03/2020   Sciatica    Past Surgical History:  Procedure Laterality Date   ABDOMINAL HYSTERECTOMY     partial   CARPAL TUNNEL RELEASE     bilat, [place]   CESAREAN SECTION     COLONOSCOPY  07/18/2010   Fields-SIMPLE ADENOMA (Next colonoscopy  06/2020)   COLONOSCOPY WITH PROPOFOL N/A 08/20/2020   Procedure: COLONOSCOPY WITH PROPOFOL;  Surgeon: Lanelle Bal, DO;  [facility];  Service: Endoscopy;  Laterality: N/A;  9:00am   TOTAL SHOULDER ARTHROPLASTY Left 01/07/2023   Procedure: LEFT TOTAL SHOULDER ARTHROPLASTY;  Surgeon: Oliver Barre, MD;  Location: [facility];  Service: Orthopedics;  Laterality: Left;   Patient Active Problem List   Diagnosis Date Noted   Arthritis of left glenohumeral joint 01/07/2023   Preoperative clearance 12/09/2022   Type 2 diabetes mellitus (HCC) 06/19/2022   Hypertension 06/02/2022   Encounter for routine adult physical exam with abnormal  findings 05/19/2022   History of adenomatous polyp of colon 07/04/2020   Acute hypoxemic respiratory failure due to COVID-19 [facility]) 03/30/2020   Hepatic cirrhosis (HCC) 01/19/2018   Colon cancer screening 01/19/2018   Chronic diarrhea 12/31/2011    PCP: Rica Records, FNP  REFERRING PROVIDER: Dr. Thane Edu  ONSET DATE: 01/07/23  REFERRING DIAG: R51.884 (ICD-10-CM) - Status post total replacement of left shoulder   THERAPY DIAG:  Acute pain of left shoulder  Stiffness of left shoulder, not elsewhere classified  Other symptoms and signs involving the musculoskeletal system  Rationale for Evaluation and Treatment: Rehabilitation  SUBJECTIVE:   SUBJECTIVE STATEMENT: S: The weather is making me sore. Pt accompanied by: self  PERTINENT HISTORY: Pt is a 63 y/o female s/p left TSA on 01/07/23. Pt received HH therapy and has transitioned to OP therapy.   PRECAUTIONS: Shoulder  WEIGHT BEARING RESTRICTIONS: No  PAIN:  Are you having pain? No  FALLS: Has patient fallen in last 6 months? No  PLOF: Independent  PATIENT GOALS: To use the LUE for ADLs.   NEXT MD VISIT: 03/31/23  OBJECTIVE:   HAND DOMINANCE: Ambidextrous  ADLs: Overall ADLs: Pt reports reaching overhead and behind back is difficult. Has difficulty with dressing, bathing, meal preparation, and household  tasks. Cannot complete lifting tasks yet, pt has been careful with mobility of the shoulder during ADLs.    FUNCTIONAL OUTCOME MEASURES: Quick Dash: 29.55  UPPER EXTREMITY ROM:       Assessed in sitting, er/IR adducted  Active ROM Left eval  Shoulder flexion 65  Shoulder abduction 50  Shoulder internal rotation 90  Shoulder external rotation 51  (Blank rows = not tested)  Assessed in supine, er/IR adducted  Active ROM Left eval  Shoulder flexion 111  Shoulder abduction 80  Shoulder internal rotation 90  Shoulder external rotation 39  (Blank rows = not tested)    UPPER EXTREMITY  MMT:     Assessed in sitting, er/IR adducted  MMT Left eval  Shoulder flexion 3-/5  Shoulder abduction 3-/5  Shoulder internal rotation 3/5  Shoulder external rotation 3/5  (Blank rows = not tested)   EDEMA: None  COGNITION: Overall cognitive status: Within functional limits for tasks assessed  OBSERVATIONS: min/mod fascial restrictions along upper arm and anterior shoulder regions   TODAY'S TREATMENT:                                                                                                                              DATE:   04/13/23 -Manual Therapy: myofascial release and trigger point applied to biceps, deltoid, trapezius, and scapular region in order to reduce fascial restrictions and pain, as well as improve ROM -AA/ROM: supine, flexion, abduction, protraction, horizontal abduction, er/IR, x10 -Pulleys: flexion, abduction, x60" each  04/08/23 -Manual Therapy: myofascial release and trigger point applied to biceps, deltoid, trapezius, and scapular region in order to reduce fascial restrictions and pain, as well as improve ROM -P/ROM: flexion, abduction, er/IR, x10 -AA/ROM: supine, flexion, abduction, protraction, horizontal abduction, er/IR, x10 -Reviewed table slides and exercises from [name]   PATIENT EDUCATION: Education details: Continue HEP Person educated: Patient Education method: Programmer, multimedia, Facilities manager, and Handouts Education comprehension: verbalized understanding and returned demonstration  HOME EXERCISE PROGRAM: Eval: table slides 1/16: AA/ROM  GOALS: Goals reviewed with patient? Yes   SHORT TERM GOALS: Target date: 04/29/23  Pt will be provided with and educated on HEP to improve mobility in LUE required for use during ADL completion.   Goal status: IN PROGRESS  2.  Pt will increase LUE P/ROM by 40+ degrees to improve ability to use LUE during dressing tasks with minimal compensatory techniques.   Goal status: IN PROGRESS  3.  Pt  will increase LUE strength to 3+/5 to improve ability to reach for items at waist to chest height during bathing and grooming tasks.   Goal status: IN PROGRESS    LONG TERM GOALS: Target date: 05/28/23  Pt will decrease pain in LUE to 2/10 or less to improve ability to sleep for 2+ consecutive hours without waking due to pain.   Goal status: IN PROGRESS  2.  Pt will decrease LUE fascial restrictions to trace amounts or less to improve mobility required  for functional reaching tasks.   Goal status: IN PROGRESS  3.  Pt will increase LUE A/ROM by 40+ degrees to improve ability to use LUE when reaching overhead or behind back during dressing and bathing tasks.   Goal status: IN PROGRESS  4.  Pt will increase LUE strength to 4/5 or greater to improve ability to use LUE when lifting or carrying items during meal preparation/housework/yardwork tasks.   Goal status: IN PROGRESS   ASSESSMENT:  CLINICAL IMPRESSION: This session, pt continuing to work on ROM due to increased stiffness and immobility. She is continuing AA/ROM with increased time and cuing for redirection. Pt achieving approximately 40-50% of full ROM with AA/ROM and 50-60% of full ROM with the pulleys. Verbal and tactile cuing provided for positioning and technique throughout session.   PERFORMANCE DEFICITS: in functional skills including in functional skills including ADLs, IADLs, coordination, tone, ROM, strength, pain, fascial restrictions, muscle spasms, and UE functional use   PLAN:  OT FREQUENCY: 2x/week  OT DURATION: 8 weeks  PLANNED INTERVENTIONS: 97168 OT Re-evaluation, 97535 self care/ADL training, 19147 therapeutic exercise, 97530 therapeutic activity, 97140 manual therapy, 97035 ultrasound, patient/family education, and DME and/or AE instructions  RECOMMENDED OTHER SERVICES: None at this time  CONSULTED AND AGREED WITH PLAN OF CARE: Patient  PLAN FOR NEXT SESSION: Follow up on HEP, manual therapy, AA/ROM,  start A/ROM, Reatha Harps, OTR/L  [phone] 04/13/2023, 8:49 AM

## 2023-04-16 ENCOUNTER — Ambulatory Visit: Payer: Medicaid Other | Admitting: Family Medicine

## 2023-04-19 ENCOUNTER — Encounter (HOSPITAL_COMMUNITY): Payer: Self-pay | Admitting: Occupational Therapy

## 2023-04-19 ENCOUNTER — Ambulatory Visit (HOSPITAL_COMMUNITY): Payer: Medicaid Other | Admitting: Occupational Therapy

## 2023-04-19 DIAGNOSIS — M25612 Stiffness of left shoulder, not elsewhere classified: Secondary | ICD-10-CM | POA: Diagnosis not present

## 2023-04-19 DIAGNOSIS — R29898 Other symptoms and signs involving the musculoskeletal system: Secondary | ICD-10-CM | POA: Diagnosis not present

## 2023-04-19 DIAGNOSIS — M25512 Pain in left shoulder: Secondary | ICD-10-CM

## 2023-04-19 DIAGNOSIS — Z96612 Presence of left artificial shoulder joint: Secondary | ICD-10-CM | POA: Diagnosis not present

## 2023-04-19 NOTE — Therapy (Signed)
OUTPATIENT OCCUPATIONAL THERAPY ORTHO TREATMENT NOTE  Patient Name: Megan Salazar MRN: [id number] DOB:Oct 11, 1960, 63 y.o., female Today's Date: 04/19/2023   END OF SESSION:  OT End of Session - 04/19/23 0910     Visit Number 4    Number of Visits 16    Date for OT Re-Evaluation 05/29/23   progress note 04/29/23   Authorization Type HB Medicaid    Authorization Time Period 5 visits approved (03/30/23-05/28/23)    Authorization - Visit Number 3    Authorization - Number of Visits 5    OT Start Time 0808    OT Stop Time 0851    OT Time Calculation (min) 43 min    Activity Tolerance Patient tolerated treatment well    Behavior During Therapy WFL for tasks assessed/performed              Past Medical History:  Diagnosis Date   Asthma    Bone spur    Carpal tunnel syndrome    Complication of anesthesia    Patient didnt wake up for 24 hours after anesthesia   Diabetes (HCC)    HTN (hypertension)    IBS (irritable bowel syndrome)    chronic diarrhea   Paresthesias    left side   Pneumonia 03/2020   Sciatica    Past Surgical History:  Procedure Laterality Date   ABDOMINAL HYSTERECTOMY     partial   CARPAL TUNNEL RELEASE     bilat, [place]   CESAREAN SECTION     COLONOSCOPY  07/18/2010   Fields-SIMPLE ADENOMA (Next colonoscopy  06/2020)   COLONOSCOPY WITH PROPOFOL N/A 08/20/2020   Procedure: COLONOSCOPY WITH PROPOFOL;  Surgeon: Lanelle Bal, DO;  [facility];  Service: Endoscopy;  Laterality: N/A;  9:00am   TOTAL SHOULDER ARTHROPLASTY Left 01/07/2023   Procedure: LEFT TOTAL SHOULDER ARTHROPLASTY;  Surgeon: Oliver Barre, MD;  Location: [facility];  Service: Orthopedics;  Laterality: Left;   Patient Active Problem List   Diagnosis Date Noted   Arthritis of left glenohumeral joint 01/07/2023   Preoperative clearance 12/09/2022   Type 2 diabetes mellitus (HCC) 06/19/2022   Hypertension 06/02/2022   Encounter for routine adult physical exam with  abnormal findings 05/19/2022   History of adenomatous polyp of colon 07/04/2020   Acute hypoxemic respiratory failure due to COVID-19 [facility]) 03/30/2020   Hepatic cirrhosis (HCC) 01/19/2018   Colon cancer screening 01/19/2018   Chronic diarrhea 12/31/2011    PCP: Rica Records, FNP  REFERRING PROVIDER: Dr. Thane Edu  ONSET DATE: 01/07/23  REFERRING DIAG: W09.811 (ICD-10-CM) - Status post total replacement of left shoulder   THERAPY DIAG:  Acute pain of left shoulder  Stiffness of left shoulder, not elsewhere classified  Other symptoms and signs involving the musculoskeletal system  Rationale for Evaluation and Treatment: Rehabilitation  SUBJECTIVE:   SUBJECTIVE STATEMENT: S: Warm water from the shower really loosens me up. Pt accompanied by: self  PERTINENT HISTORY: Pt is a 63 y/o female s/p left TSA on 01/07/23. Pt received HH therapy and has transitioned to OP therapy.   PRECAUTIONS: Shoulder  WEIGHT BEARING RESTRICTIONS: No  PAIN:  Are you having pain? No  FALLS: Has patient fallen in last 6 months? No  PLOF: Independent  PATIENT GOALS: To use the LUE for ADLs.   NEXT MD VISIT: 03/31/23  OBJECTIVE:   HAND DOMINANCE: Ambidextrous  ADLs: Overall ADLs: Pt reports reaching overhead and behind back is difficult. Has difficulty with dressing, bathing,  meal preparation, and household tasks. Cannot complete lifting tasks yet, pt has been careful with mobility of the shoulder during ADLs.    FUNCTIONAL OUTCOME MEASURES: Quick Dash: 29.55  UPPER EXTREMITY ROM:       Assessed in sitting, er/IR adducted  Active ROM Left eval  Shoulder flexion 65  Shoulder abduction 50  Shoulder internal rotation 90  Shoulder external rotation 51  (Blank rows = not tested)  Assessed in supine, er/IR adducted  Active ROM Left eval  Shoulder flexion 111  Shoulder abduction 80  Shoulder internal rotation 90  Shoulder external rotation 39  (Blank rows = not  tested)    UPPER EXTREMITY MMT:     Assessed in sitting, er/IR adducted  MMT Left eval  Shoulder flexion 3-/5  Shoulder abduction 3-/5  Shoulder internal rotation 3/5  Shoulder external rotation 3/5  (Blank rows = not tested)   EDEMA: None  COGNITION: Overall cognitive status: Within functional limits for tasks assessed  OBSERVATIONS: min/mod fascial restrictions along upper arm and anterior shoulder regions   TODAY'S TREATMENT:                                                                                                                              DATE:   04/19/23 -Manual Therapy: myofascial release and trigger point applied to biceps, deltoid, trapezius, and scapular region in order to reduce fascial restrictions and pain, as well as improve ROM -AA/ROM: supine, flexion, abduction, protraction, horizontal abduction, er/IR, x10 -wall climbs: flexion, x10 -Proximal shoulder exercises: paddles, criss cross, circles both directions, x10 each -Pulleys: flexion, abduction, x60" each  04/13/23 -Manual Therapy: myofascial release and trigger point applied to biceps, deltoid, trapezius, and scapular region in order to reduce fascial restrictions and pain, as well as improve ROM -AA/ROM: supine, flexion, abduction, protraction, horizontal abduction, er/IR, x10 -Pulleys: flexion, abduction, x60" each  04/08/23 -Manual Therapy: myofascial release and trigger point applied to biceps, deltoid, trapezius, and scapular region in order to reduce fascial restrictions and pain, as well as improve ROM -P/ROM: flexion, abduction, er/IR, x10 -AA/ROM: supine, flexion, abduction, protraction, horizontal abduction, er/IR, x10 -Reviewed table slides and exercises from [name]   PATIENT EDUCATION: Education details: Continue HEP Person educated: Patient Education method: Programmer, multimedia, Facilities manager, and Handouts Education comprehension: verbalized understanding and returned  demonstration  HOME EXERCISE PROGRAM: Eval: table slides 1/16: AA/ROM  GOALS: Goals reviewed with patient? Yes   SHORT TERM GOALS: Target date: 04/29/23  Pt will be provided with and educated on HEP to improve mobility in LUE required for use during ADL completion.   Goal status: IN PROGRESS  2.  Pt will increase LUE P/ROM by 40+ degrees to improve ability to use LUE during dressing tasks with minimal compensatory techniques.   Goal status: IN PROGRESS  3.  Pt will increase LUE strength to 3+/5 to improve ability to reach for items at waist to chest height during bathing and grooming tasks.  Goal status: IN PROGRESS    LONG TERM GOALS: Target date: 05/28/23  Pt will decrease pain in LUE to 2/10 or less to improve ability to sleep for 2+ consecutive hours without waking due to pain.   Goal status: IN PROGRESS  2.  Pt will decrease LUE fascial restrictions to trace amounts or less to improve mobility required for functional reaching tasks.   Goal status: IN PROGRESS  3.  Pt will increase LUE A/ROM by 40+ degrees to improve ability to use LUE when reaching overhead or behind back during dressing and bathing tasks.   Goal status: IN PROGRESS  4.  Pt will increase LUE strength to 4/5 or greater to improve ability to use LUE when lifting or carrying items during meal preparation/housework/yardwork tasks.   Goal status: IN PROGRESS   ASSESSMENT:  CLINICAL IMPRESSION: Pt continues to demonstrate increased stiffness and poor mobility. She is able to achieve approximately 45-50% of full ROM with AA/ROM and 55-60% of full ROM with pulleys. Pt reports good follow through with HEP at home, including exercises from [facility] therapy and [name]. OT providing verbal and tactile cuing for positioning and technique throughout session.   PERFORMANCE DEFICITS: in functional skills including in functional skills including ADLs, IADLs, coordination, tone, ROM, strength, pain, fascial  restrictions, muscle spasms, and UE functional use   PLAN:  OT FREQUENCY: 2x/week  OT DURATION: 8 weeks  PLANNED INTERVENTIONS: 97168 OT Re-evaluation, 97535 self care/ADL training, 16109 therapeutic exercise, 97530 therapeutic activity, 97140 manual therapy, 97035 ultrasound, patient/family education, and DME and/or AE instructions  RECOMMENDED OTHER SERVICES: None at this time  CONSULTED AND AGREED WITH PLAN OF CARE: Patient  PLAN FOR NEXT SESSION: Follow up on HEP, manual therapy, AA/ROM, start A/ROM, Reatha Harps, OTR/L  [phone] 04/19/2023, 9:11 AM

## 2023-04-28 ENCOUNTER — Ambulatory Visit (HOSPITAL_COMMUNITY): Payer: Medicaid Other | Attending: Orthopedic Surgery | Admitting: Occupational Therapy

## 2023-04-28 ENCOUNTER — Encounter (HOSPITAL_COMMUNITY): Payer: Self-pay | Admitting: Occupational Therapy

## 2023-04-28 DIAGNOSIS — R29898 Other symptoms and signs involving the musculoskeletal system: Secondary | ICD-10-CM | POA: Insufficient documentation

## 2023-04-28 DIAGNOSIS — M25512 Pain in left shoulder: Secondary | ICD-10-CM | POA: Diagnosis not present

## 2023-04-28 DIAGNOSIS — M25612 Stiffness of left shoulder, not elsewhere classified: Secondary | ICD-10-CM | POA: Insufficient documentation

## 2023-04-28 NOTE — Patient Instructions (Signed)

## 2023-04-28 NOTE — Therapy (Signed)
 OUTPATIENT OCCUPATIONAL THERAPY ORTHO TREATMENT NOTE  Patient Name: Megan Salazar MRN: [id number] DOB:1960-09-01, 63 y.o., female Today's Date: 04/28/2023   END OF SESSION:  OT End of Session - 04/28/23 0850     Visit Number 5    Number of Visits 16    Date for OT Re-Evaluation 05/29/23   progress note 04/29/23   Authorization Type HB Medicaid    Authorization Time Period 5 visits approved (03/30/23-05/28/23)    Authorization - Visit Number 4    Authorization - Number of Visits 5    OT Start Time 0806    OT Stop Time 0850    OT Time Calculation (min) 44 min    Activity Tolerance Patient tolerated treatment well    Behavior During Therapy WFL for tasks assessed/performed             Past Medical History:  Diagnosis Date   Asthma    Bone spur    Carpal tunnel syndrome    Complication of anesthesia    Patient didnt wake up for 24 hours after anesthesia   Diabetes (HCC)    HTN (hypertension)    IBS (irritable bowel syndrome)    chronic diarrhea   Paresthesias    left side   Pneumonia 03/2020   Sciatica    Past Surgical History:  Procedure Laterality Date   ABDOMINAL HYSTERECTOMY     partial   CARPAL TUNNEL RELEASE     bilat, [place]   CESAREAN SECTION     COLONOSCOPY  07/18/2010   Fields-SIMPLE ADENOMA (Next colonoscopy  06/2020)   COLONOSCOPY WITH PROPOFOL  N/A 08/20/2020   Procedure: COLONOSCOPY WITH PROPOFOL ;  Surgeon: Cindie Carlin POUR, DO;  Location: [facility];  Service: Endoscopy;  Laterality: N/A;  9:00am   TOTAL SHOULDER ARTHROPLASTY Left 01/07/2023   Procedure: LEFT TOTAL SHOULDER ARTHROPLASTY;  Surgeon: Onesimo [name] DELENA, MD;  Location: [facility];  Service: Orthopedics;  Laterality: Left;   Patient Active Problem List   Diagnosis Date Noted   Arthritis of left glenohumeral joint 01/07/2023   Preoperative clearance 12/09/2022   Type 2 diabetes mellitus (HCC) 06/19/2022   Hypertension 06/02/2022   Encounter for routine adult physical exam with abnormal  findings 05/19/2022   History of adenomatous polyp of colon 07/04/2020   Acute hypoxemic respiratory failure due to COVID-19 [facility]) 03/30/2020   Hepatic cirrhosis (HCC) 01/19/2018   Colon cancer screening 01/19/2018   Chronic diarrhea 12/31/2011    PCP: Hilario Terry Wilhelmena Lloyd, FNP  REFERRING PROVIDER: Dr. [name] Onesimo  ONSET DATE: 01/07/23  REFERRING DIAG: S03.387 (ICD-10-CM) - Status post total replacement of left shoulder   THERAPY DIAG:  Acute pain of left shoulder  Stiffness of left shoulder, not elsewhere classified  Other symptoms and signs involving the musculoskeletal system  Rationale for Evaluation and Treatment: Rehabilitation  SUBJECTIVE:   SUBJECTIVE STATEMENT: S: I am really stiff. Pt accompanied by: self  PERTINENT HISTORY: Pt is a 63 y/o female s/p left TSA on 01/07/23. Pt received HH therapy and has transitioned to OP therapy.   PRECAUTIONS: Shoulder  WEIGHT BEARING RESTRICTIONS: No  PAIN:  Are you having pain? No  FALLS: Has patient fallen in last 6 months? No  PLOF: Independent  PATIENT GOALS: To use the LUE for ADLs.   NEXT MD VISIT: 03/31/23  OBJECTIVE:   HAND DOMINANCE: Ambidextrous  ADLs: Overall ADLs: Pt reports reaching overhead and behind back is difficult. Has difficulty with dressing, bathing, meal preparation, and household tasks. Cannot  complete lifting tasks yet, pt has been careful with mobility of the shoulder during ADLs.    FUNCTIONAL OUTCOME MEASURES: Quick Dash: 29.55  UPPER EXTREMITY ROM:       Assessed in sitting, er/IR adducted  Active ROM Left eval  Shoulder flexion 65  Shoulder abduction 50  Shoulder internal rotation 90  Shoulder external rotation 51  (Blank rows = not tested)  Assessed in supine, er/IR adducted  Active ROM Left eval  Shoulder flexion 111  Shoulder abduction 80  Shoulder internal rotation 90  Shoulder external rotation 39  (Blank rows = not tested)    UPPER EXTREMITY MMT:      Assessed in sitting, er/IR adducted  MMT Left eval  Shoulder flexion 3-/5  Shoulder abduction 3-/5  Shoulder internal rotation 3/5  Shoulder external rotation 3/5  (Blank rows = not tested)   EDEMA: None  COGNITION: Overall cognitive status: Within functional limits for tasks assessed  OBSERVATIONS: min/mod fascial restrictions along upper arm and anterior shoulder regions   TODAY'S TREATMENT:                                                                                                                              DATE:   04/28/23 -Pulleys: flexion, abduction, x60 each -AA/ROM: seated, flexion, abduction, protraction, horizontal abduction, er/IR, x10 -Theraball Exercises: basket ball, flexion, protraction, overhead press, v ups, circles both directions, x10 -wall slides: flexion, abduction, x10 -A/ROM: seated, flexion, abduction, protraction, horizontal abduction, er/IR, x5  04/19/23 -Manual Therapy: myofascial release and trigger point applied to biceps, deltoid, trapezius, and scapular region in order to reduce fascial restrictions and pain, as well as improve ROM -AA/ROM: supine, flexion, abduction, protraction, horizontal abduction, er/IR, x10 -wall climbs: flexion, x10 -Proximal shoulder exercises: paddles, criss cross, circles both directions, x10 each -Pulleys: flexion, abduction, x60 each  04/13/23 -Manual Therapy: myofascial release and trigger point applied to biceps, deltoid, trapezius, and scapular region in order to reduce fascial restrictions and pain, as well as improve ROM -AA/ROM: supine, flexion, abduction, protraction, horizontal abduction, er/IR, x10 -Pulleys: flexion, abduction, x60 each   PATIENT EDUCATION: Education details: A/ROM Person educated: Patient Education method: Programmer, Multimedia, Facilities Manager, and Handouts Education comprehension: verbalized understanding and returned demonstration  HOME EXERCISE PROGRAM: Eval: table slides 1/16:  AA/ROM 2/5: A/ROM  GOALS: Goals reviewed with patient? Yes   SHORT TERM GOALS: Target date: 04/29/23  Pt will be provided with and educated on HEP to improve mobility in LUE required for use during ADL completion.   Goal status: IN PROGRESS  2.  Pt will increase LUE P/ROM by 40+ degrees to improve ability to use LUE during dressing tasks with minimal compensatory techniques.   Goal status: IN PROGRESS  3.  Pt will increase LUE strength to 3+/5 to improve ability to reach for items at waist to chest height during bathing and grooming tasks.   Goal status: IN PROGRESS    LONG TERM GOALS: Target date:  05/28/23  Pt will decrease pain in LUE to 2/10 or less to improve ability to sleep for 2+ consecutive hours without waking due to pain.   Goal status: IN PROGRESS  2.  Pt will decrease LUE fascial restrictions to trace amounts or less to improve mobility required for functional reaching tasks.   Goal status: IN PROGRESS  3.  Pt will increase LUE A/ROM by 40+ degrees to improve ability to use LUE when reaching overhead or behind back during dressing and bathing tasks.   Goal status: IN PROGRESS  4.  Pt will increase LUE strength to 4/5 or greater to improve ability to use LUE when lifting or carrying items during meal preparation/housework/yardwork tasks.   Goal status: IN PROGRESS   ASSESSMENT:  CLINICAL IMPRESSION: This session, OT deferred manual therapy to complete more exercises and movements this session. Pt demonstrated increased stiffness and inability to reach out or up more than 90 degrees. Overall pt continues to state pain with all movements and how she stops at home with pain, despite OT education to push through pain to improve overall mobility. Verbal and tactile cuing provided for positioning, technique, and pushing through movements to improve overall mobility.   PERFORMANCE DEFICITS: in functional skills including in functional skills including ADLs, IADLs,  coordination, tone, ROM, strength, pain, fascial restrictions, muscle spasms, and UE functional use   PLAN:  OT FREQUENCY: 2x/week  OT DURATION: 8 weeks  PLANNED INTERVENTIONS: 97168 OT Re-evaluation, 97535 self care/ADL training, 02889 therapeutic exercise, 97530 therapeutic activity, 97140 manual therapy, 97035 ultrasound, patient/family education, and DME and/or AE instructions  RECOMMENDED OTHER SERVICES: None at this time  CONSULTED AND AGREED WITH PLAN OF CARE: Patient  PLAN FOR NEXT SESSION: Follow up on HEP, manual therapy, AA/ROM, start A/ROM, UBE - restart P/ROM   Valentin Nightingale, OTR/L  [phone] 04/28/2023, 9:28 AM

## 2023-05-04 ENCOUNTER — Ambulatory Visit (HOSPITAL_COMMUNITY): Payer: Medicaid Other | Admitting: Occupational Therapy

## 2023-05-04 ENCOUNTER — Encounter (HOSPITAL_COMMUNITY): Payer: Self-pay | Admitting: Occupational Therapy

## 2023-05-04 DIAGNOSIS — R29898 Other symptoms and signs involving the musculoskeletal system: Secondary | ICD-10-CM | POA: Diagnosis not present

## 2023-05-04 DIAGNOSIS — M25512 Pain in left shoulder: Secondary | ICD-10-CM | POA: Diagnosis not present

## 2023-05-04 DIAGNOSIS — M25612 Stiffness of left shoulder, not elsewhere classified: Secondary | ICD-10-CM | POA: Diagnosis not present

## 2023-05-04 NOTE — Therapy (Signed)
 OUTPATIENT OCCUPATIONAL THERAPY ORTHO TREATMENT NOTE REASSESSMENT/RECERTIFICATION  Patient Name: Megan Salazar MRN: [id number] DOB:27-Sep-1960, 63 y.o., female Today's Date: 05/04/2023   END OF SESSION:  OT End of Session - 05/04/23 1100     Visit Number 6    Number of Visits 16    Date for OT Re-Evaluation 05/29/23   progress note 04/29/23   Authorization Type HB Medicaid    Authorization Time Period 5 visits approved (03/30/23-05/28/23) - requesting 8 more visits    Authorization - Visit Number 5    Authorization - Number of Visits 5    OT Start Time 1012    OT Stop Time 1100    OT Time Calculation (min) 48 min    Activity Tolerance Patient tolerated treatment well    Behavior During Therapy WFL for tasks assessed/performed             Past Medical History:  Diagnosis Date   Asthma    Bone spur    Carpal tunnel syndrome    Complication of anesthesia    Patient didnt wake up for 24 hours after anesthesia   Diabetes (HCC)    HTN (hypertension)    IBS (irritable bowel syndrome)    chronic diarrhea   Paresthesias    left side   Pneumonia 03/2020   Sciatica    Past Surgical History:  Procedure Laterality Date   ABDOMINAL HYSTERECTOMY     partial   CARPAL TUNNEL RELEASE     bilat, [place]   CESAREAN SECTION     COLONOSCOPY  07/18/2010   Fields-SIMPLE ADENOMA (Next colonoscopy  06/2020)   COLONOSCOPY WITH PROPOFOL N/A 08/20/2020   Procedure: COLONOSCOPY WITH PROPOFOL;  Surgeon: Lanelle Bal, DO;  [facility]: [facility];  Service: Endoscopy;  Laterality: N/A;  9:00am   TOTAL SHOULDER ARTHROPLASTY Left 01/07/2023   Procedure: LEFT TOTAL SHOULDER ARTHROPLASTY;  Surgeon: Oliver Barre, MD;  Location: [facility];  Service: Orthopedics;  Laterality: Left;   Patient Active Problem List   Diagnosis Date Noted   Arthritis of left glenohumeral joint 01/07/2023   Preoperative clearance 12/09/2022   Type 2 diabetes mellitus (HCC) 06/19/2022   Hypertension 06/02/2022    Encounter for routine adult physical exam with abnormal findings 05/19/2022   History of adenomatous polyp of colon 07/04/2020   Acute hypoxemic respiratory failure due to COVID-19 [facility]) 03/30/2020   Hepatic cirrhosis (HCC) 01/19/2018   Colon cancer screening 01/19/2018   Chronic diarrhea 12/31/2011    PCP: Rica Records, FNP  REFERRING PROVIDER: Dr. Thane Edu  ONSET DATE: 01/07/23  REFERRING DIAG: X32.440 (ICD-10-CM) - Status post total replacement of left shoulder   THERAPY DIAG:  Acute pain of left shoulder  Stiffness of left shoulder, not elsewhere classified  Other symptoms and signs involving the musculoskeletal system  Rationale for Evaluation and Treatment: Rehabilitation  SUBJECTIVE:   SUBJECTIVE STATEMENT: S: I want to get back to swimming. Pt accompanied by: self  PERTINENT HISTORY: Pt is a 63 y/o female s/p left TSA on 01/07/23. Pt received HH therapy and has transitioned to OP therapy.   PRECAUTIONS: Shoulder  WEIGHT BEARING RESTRICTIONS: No  PAIN:  Are you having pain? No  FALLS: Has patient fallen in last 6 months? No  PLOF: Independent  PATIENT GOALS: To use the LUE for ADLs.   NEXT MD VISIT: 03/31/23  OBJECTIVE:   HAND DOMINANCE: Ambidextrous  ADLs: Overall ADLs: Pt reports reaching overhead and behind back is difficult. Has difficulty  with dressing, bathing, meal preparation, and household tasks. Cannot complete lifting tasks yet, pt has been careful with mobility of the shoulder during ADLs.    FUNCTIONAL OUTCOME MEASURES: Quick Dash: 29.55 05/04/23: 25  UPPER EXTREMITY ROM:       Assessed in sitting, er/IR adducted  Active ROM Left eval Left 05/04/23  Shoulder flexion 65 63  Shoulder abduction 50 62  Shoulder internal rotation 90 90  Shoulder external rotation 51 78  (Blank rows = not tested)  Assessed in supine, er/IR adducted  Active ROM Left eval  Shoulder flexion 111  Shoulder abduction 80  Shoulder  internal rotation 90  Shoulder external rotation 39  (Blank rows = not tested)    UPPER EXTREMITY MMT:     Assessed in sitting, er/IR adducted  MMT Left eval Left 05/04/23  Shoulder flexion 3-/5 3-/5  Shoulder abduction 3-/5 4-/5  Shoulder internal rotation 3/5 4/5  Shoulder external rotation 3/5 4-/5  (Blank rows = not tested)   EDEMA: None  COGNITION: Overall cognitive status: Within functional limits for tasks assessed  OBSERVATIONS: min/mod fascial restrictions along upper arm and anterior shoulder regions   TODAY'S TREATMENT:                                                                                                                              DATE:   05/04/23 -Manual Therapy: myofascial release and trigger point applied to biceps, deltoid, trapezius, and scapular region in order to reduce fascial restrictions and pain, as well as improve ROM -P/ROM: supine, flexion, abduction, er/IR, x8 -AA/ROM: supine, flexion, abduction, protraction, horizontal abduction, er/IR, x10 -Proximal shoulder exercises: paddles, criss cross, circles both directions, x10 each -Measurements for Reassessment  04/28/23 -Pulleys: flexion, abduction, x60" each -AA/ROM: seated, flexion, abduction, protraction, horizontal abduction, er/IR, x10 -Theraball Exercises: basket ball, flexion, protraction, overhead press, v ups, circles both directions, x10 -wall slides: flexion, abduction, x10 -A/ROM: seated, flexion, abduction, protraction, horizontal abduction, er/IR, x5  04/19/23 -Manual Therapy: myofascial release and trigger point applied to biceps, deltoid, trapezius, and scapular region in order to reduce fascial restrictions and pain, as well as improve ROM -AA/ROM: supine, flexion, abduction, protraction, horizontal abduction, er/IR, x10 -wall climbs: flexion, x10 -Proximal shoulder exercises: paddles, criss cross, circles both directions, x10 each -Pulleys: flexion, abduction, x60"  each   PATIENT EDUCATION: Education details: A/ROM Person educated: Patient Education method: Programmer, multimedia, Facilities manager, and Handouts Education comprehension: verbalized understanding and returned demonstration  HOME EXERCISE PROGRAM: Eval: table slides 1/16: AA/ROM 2/5: A/ROM  GOALS: Goals reviewed with patient? Yes   SHORT TERM GOALS: Target date: 04/29/23  Pt will be provided with and educated on HEP to improve mobility in LUE required for use during ADL completion.   Goal status: IN PROGRESS  2.  Pt will increase LUE P/ROM by 40+ degrees to improve ability to use LUE during dressing tasks with minimal compensatory techniques.   Goal status: IN PROGRESS  3.  Pt will increase LUE strength to 3+/5 to improve ability to reach for items at waist to chest height during bathing and grooming tasks.   Goal status: IN PROGRESS    LONG TERM GOALS: Target date: 05/28/23  Pt will decrease pain in LUE to 2/10 or less to improve ability to sleep for 2+ consecutive hours without waking due to pain.   Goal status: IN PROGRESS  2.  Pt will decrease LUE fascial restrictions to trace amounts or less to improve mobility required for functional reaching tasks.   Goal status: IN PROGRESS  3.  Pt will increase LUE A/ROM by 40+ degrees to improve ability to use LUE when reaching overhead or behind back during dressing and bathing tasks.   Goal status: IN PROGRESS  4.  Pt will increase LUE strength to 4/5 or greater to improve ability to use LUE when lifting or carrying items during meal preparation/housework/yardwork tasks.   Goal status: IN PROGRESS   ASSESSMENT:  CLINICAL IMPRESSION: This session, pt completed reassessment for recertification. She demonstrated incremental improvements in her ROM and strength, specifically with internal and external rotation. Pt continues to have significant muscle tension along the biceps, deltoid, and anterior shoulder girdle, contributing to  limited ROM. Session continuing to work on her overall ROM. Verbal and tactile cuing provided for positioning and technique.   PERFORMANCE DEFICITS: in functional skills including in functional skills including ADLs, IADLs, coordination, tone, ROM, strength, pain, fascial restrictions, muscle spasms, and UE functional use   PLAN:  OT FREQUENCY: 2x/week  OT DURATION: 8 weeks  PLANNED INTERVENTIONS: 97168 OT Re-evaluation, 97535 self care/ADL training, 16109 therapeutic exercise, 97530 therapeutic activity, 97140 manual therapy, 97035 ultrasound, patient/family education, and DME and/or AE instructions  RECOMMENDED OTHER SERVICES: None at this time  CONSULTED AND AGREED WITH PLAN OF CARE: Patient  PLAN FOR NEXT SESSION: Follow up on HEP, manual therapy, AA/ROM, start A/ROM, UBE - restart P/ROM   Trish Mage, OTR/L  [phone] 05/04/2023, 12:08 PM   Managed Medicaid Authorization Request  Visit Dx Codes: B14.782, M25.612, R29.898  Functional Tool Score: Quick dash: 25  For all possible CPT codes, reference the Planned Interventions line above.     Check all conditions that are expected to impact treatment: {Conditions expected to impact treatment:None of these apply   If treatment provided at initial evaluation, no treatment charged due to lack of authorization.

## 2023-05-11 ENCOUNTER — Ambulatory Visit (HOSPITAL_COMMUNITY): Payer: Medicaid Other | Admitting: Occupational Therapy

## 2023-05-11 ENCOUNTER — Encounter (HOSPITAL_COMMUNITY): Payer: Self-pay | Admitting: Occupational Therapy

## 2023-05-11 DIAGNOSIS — M25612 Stiffness of left shoulder, not elsewhere classified: Secondary | ICD-10-CM

## 2023-05-11 DIAGNOSIS — R29898 Other symptoms and signs involving the musculoskeletal system: Secondary | ICD-10-CM

## 2023-05-11 DIAGNOSIS — M25512 Pain in left shoulder: Secondary | ICD-10-CM | POA: Diagnosis not present

## 2023-05-11 NOTE — Therapy (Signed)
 OUTPATIENT OCCUPATIONAL THERAPY ORTHO TREATMENT NOTE   Patient Name: Megan Salazar MRN: [id number] DOB:Sep 08, 1960, 63 y.o., female Today's Date: 05/11/2023   END OF SESSION:  OT End of Session - 05/11/23 1105     Visit Number 7    Number of Visits 16    Date for OT Re-Evaluation 05/29/23   progress note 04/29/23   Authorization Type HB Medicaid    Authorization Time Period 5 visits approved (03/30/23-05/28/23), 2 visits approved (05/04/23-06/02/23)    Authorization - Visit Number 1    Authorization - Number of Visits 2    OT Start Time 1023    OT Stop Time 1105    OT Time Calculation (min) 42 min    Activity Tolerance Patient tolerated treatment well    Behavior During Therapy WFL for tasks assessed/performed             Past Medical History:  Diagnosis Date   Asthma    Bone spur    Carpal tunnel syndrome    Complication of anesthesia    Patient didnt wake up for 24 hours after anesthesia   Diabetes (HCC)    HTN (hypertension)    IBS (irritable bowel syndrome)    chronic diarrhea   Paresthesias    left side   Pneumonia 03/2020   Sciatica    Past Surgical History:  Procedure Laterality Date   ABDOMINAL HYSTERECTOMY     partial   CARPAL TUNNEL RELEASE     bilat, [place]   CESAREAN SECTION     COLONOSCOPY  07/18/2010   Fields-SIMPLE ADENOMA (Next colonoscopy  06/2020)   COLONOSCOPY WITH PROPOFOL N/A 08/20/2020   Procedure: COLONOSCOPY WITH PROPOFOL;  Surgeon: Lanelle Bal, DO;  [facility]: [facility];  Service: Endoscopy;  Laterality: N/A;  9:00am   TOTAL SHOULDER ARTHROPLASTY Left 01/07/2023   Procedure: LEFT TOTAL SHOULDER ARTHROPLASTY;  Surgeon: Oliver Barre, MD;  Location: [facility];  Service: Orthopedics;  Laterality: Left;   Patient Active Problem List   Diagnosis Date Noted   Arthritis of left glenohumeral joint 01/07/2023   Preoperative clearance 12/09/2022   Type 2 diabetes mellitus (HCC) 06/19/2022   Hypertension 06/02/2022   Encounter for  routine adult physical exam with abnormal findings 05/19/2022   History of adenomatous polyp of colon 07/04/2020   Acute hypoxemic respiratory failure due to COVID-19 [facility]) 03/30/2020   Hepatic cirrhosis (HCC) 01/19/2018   Colon cancer screening 01/19/2018   Chronic diarrhea 12/31/2011    PCP: Rica Records, FNP  REFERRING PROVIDER: Dr. Thane Edu  ONSET DATE: 01/07/23  REFERRING DIAG: W09.811 (ICD-10-CM) - Status post total replacement of left shoulder   THERAPY DIAG:  Acute pain of left shoulder  Stiffness of left shoulder, not elsewhere classified  Other symptoms and signs involving the musculoskeletal system  Rationale for Evaluation and Treatment: Rehabilitation  SUBJECTIVE:   SUBJECTIVE STATEMENT: S: Everything but my shoulder is hurting today Pt accompanied by: self  PERTINENT HISTORY: Pt is a 63 y/o female s/p left TSA on 01/07/23. Pt received HH therapy and has transitioned to OP therapy.   PRECAUTIONS: Shoulder  WEIGHT BEARING RESTRICTIONS: No  PAIN:  Are you having pain?  Yes, my whole body except my shoulder  FALLS: Has patient fallen in last 6 months? No  PLOF: Independent  PATIENT GOALS: To use the LUE for ADLs.   NEXT MD VISIT: 03/31/23  OBJECTIVE:   HAND DOMINANCE: Ambidextrous  ADLs: Overall ADLs: Pt reports reaching overhead and  behind back is difficult. Has difficulty with dressing, bathing, meal preparation, and household tasks. Cannot complete lifting tasks yet, pt has been careful with mobility of the shoulder during ADLs.    FUNCTIONAL OUTCOME MEASURES: Quick Dash: 29.55 05/04/23: 25  UPPER EXTREMITY ROM:       Assessed in sitting, er/IR adducted  Active ROM Left eval Left 05/04/23  Shoulder flexion 65 63  Shoulder abduction 50 62  Shoulder internal rotation 90 90  Shoulder external rotation 51 78  (Blank rows = not tested)  Assessed in supine, er/IR adducted  Active ROM Left eval  Shoulder flexion 111   Shoulder abduction 80  Shoulder internal rotation 90  Shoulder external rotation 39  (Blank rows = not tested)    UPPER EXTREMITY MMT:     Assessed in sitting, er/IR adducted  MMT Left eval Left 05/04/23  Shoulder flexion 3-/5 3-/5  Shoulder abduction 3-/5 4-/5  Shoulder internal rotation 3/5 4/5  Shoulder external rotation 3/5 4-/5  (Blank rows = not tested)   EDEMA: None  COGNITION: Overall cognitive status: Within functional limits for tasks assessed  OBSERVATIONS: min/mod fascial restrictions along upper arm and anterior shoulder regions   TODAY'S TREATMENT:                                                                                                                              DATE:   05/12/23 -Manual Therapy: myofascial release and trigger point applied to biceps, deltoid, trapezius, and scapular region in order to reduce fascial restrictions and pain, as well as improve ROM -P/ROM: supine, flexion, abduction, er/IR, x8 -AA/ROM: seated, flexion, abduction, protraction, horizontal abduction, er/IR, x12 -Proximal shoulder exercises: paddles, criss cross, circles both directions, x10 each -A/ROM: seated, flexion, protraction, er/IR, horizontal abduction, x10 -Stretching: er behind the back with towel  05/04/23 -Manual Therapy: myofascial release and trigger point applied to biceps, deltoid, trapezius, and scapular region in order to reduce fascial restrictions and pain, as well as improve ROM -P/ROM: supine, flexion, abduction, er/IR, x8 -AA/ROM: supine, flexion, abduction, protraction, horizontal abduction, er/IR, x10 -Proximal shoulder exercises: paddles, criss cross, circles both directions, x10 each -Measurements for Reassessment  04/28/23 -Pulleys: flexion, abduction, x60" each -AA/ROM: seated, flexion, abduction, protraction, horizontal abduction, er/IR, x10 -Theraball Exercises: basket ball, flexion, protraction, overhead press, v ups, circles both  directions, x10 -wall slides: flexion, abduction, x10 -A/ROM: seated, flexion, abduction, protraction, horizontal abduction, er/IR, x5   PATIENT EDUCATION: Education details: Continue HEP Person educated: Patient Education method: Programmer, multimedia, Demonstration, and Handouts Education comprehension: verbalized understanding and returned demonstration  HOME EXERCISE PROGRAM: Eval: table slides 1/16: AA/ROM 2/5: A/ROM  GOALS: Goals reviewed with patient? Yes   SHORT TERM GOALS: Target date: 04/29/23  Pt will be provided with and educated on HEP to improve mobility in LUE required for use during ADL completion.   Goal status: IN PROGRESS  2.  Pt will increase LUE P/ROM by 40+ degrees to improve ability  to use LUE during dressing tasks with minimal compensatory techniques.   Goal status: IN PROGRESS  3.  Pt will increase LUE strength to 3+/5 to improve ability to reach for items at waist to chest height during bathing and grooming tasks.   Goal status: IN PROGRESS    LONG TERM GOALS: Target date: 05/28/23  Pt will decrease pain in LUE to 2/10 or less to improve ability to sleep for 2+ consecutive hours without waking due to pain.   Goal status: IN PROGRESS  2.  Pt will decrease LUE fascial restrictions to trace amounts or less to improve mobility required for functional reaching tasks.   Goal status: IN PROGRESS  3.  Pt will increase LUE A/ROM by 40+ degrees to improve ability to use LUE when reaching overhead or behind back during dressing and bathing tasks.   Goal status: IN PROGRESS  4.  Pt will increase LUE strength to 4/5 or greater to improve ability to use LUE when lifting or carrying items during meal preparation/housework/yardwork tasks.   Goal status: IN PROGRESS   ASSESSMENT:  CLINICAL IMPRESSION: This session, pt continuing to work on her ROM. She was able to demonstrate incremental improvements with AA/ROM, with no pain this session, however abduction  continues to be very limited. OT had pt continue to work on A/ROM as well this session, which is severely limited with upper arm clamped down to her side with all movements. OT starting stretches this session, which pt feels will help a lot. Verbal and tactile cuing provided for positioning and technique throughout session for positioning and technique.   PERFORMANCE DEFICITS: in functional skills including in functional skills including ADLs, IADLs, coordination, tone, ROM, strength, pain, fascial restrictions, muscle spasms, and UE functional use   PLAN:  OT FREQUENCY: 2x/week  OT DURATION: 8 weeks  PLANNED INTERVENTIONS: 97168 OT Re-evaluation, 97535 self care/ADL training, 16109 therapeutic exercise, 97530 therapeutic activity, 97140 manual therapy, 97035 ultrasound, patient/family education, and DME and/or AE instructions  RECOMMENDED OTHER SERVICES: None at this time  CONSULTED AND AGREED WITH PLAN OF CARE: Patient  PLAN FOR NEXT SESSION: Follow up on HEP, manual therapy, ROM, proximal shoulder exercises, isometrics, start stretches, start scapular strengthening   Trish Mage, OTR/L  [phone] 05/11/2023, 1:45 PM   Managed Medicaid Authorization Request  Visit Dx Codes: B14.782, M25.612, R29.898  Functional Tool Score: Quick dash: 25  For all possible CPT codes, reference the Planned Interventions line above.     Check all conditions that are expected to impact treatment: {Conditions expected to impact treatment:None of these apply   If treatment provided at initial evaluation, no treatment charged due to lack of authorization.

## 2023-05-21 ENCOUNTER — Encounter (HOSPITAL_COMMUNITY): Payer: Self-pay | Admitting: Occupational Therapy

## 2023-05-21 ENCOUNTER — Ambulatory Visit (HOSPITAL_COMMUNITY): Payer: Medicaid Other | Admitting: Occupational Therapy

## 2023-05-21 DIAGNOSIS — R29898 Other symptoms and signs involving the musculoskeletal system: Secondary | ICD-10-CM

## 2023-05-21 DIAGNOSIS — M25512 Pain in left shoulder: Secondary | ICD-10-CM

## 2023-05-21 DIAGNOSIS — M25612 Stiffness of left shoulder, not elsewhere classified: Secondary | ICD-10-CM

## 2023-05-21 NOTE — Therapy (Signed)
 OUTPATIENT OCCUPATIONAL THERAPY ORTHO TREATMENT NOTE REASSESSMENT/RECERTIFICATION   Patient Name: Megan Salazar MRN: [id number] DOB:11/21/1960, 63 y.o., female Today's Date: 05/21/2023   END OF SESSION:  OT End of Session - 05/21/23 1251     Visit Number 8    Number of Visits 16    Date for OT Re-Evaluation 05/29/23   progress note 04/29/23   Authorization Type HB Medicaid    Authorization Time Period 5 visits approved (03/30/23-05/28/23), 2 visits approved (05/04/23-06/02/23)    Authorization - Visit Number 2    Authorization - Number of Visits 2    OT Start Time 1153    OT Stop Time 1235    OT Time Calculation (min) 42 min    Activity Tolerance Patient tolerated treatment well    Behavior During Therapy WFL for tasks assessed/performed             Past Medical History:  Diagnosis Date   Asthma    Bone spur    Carpal tunnel syndrome    Complication of anesthesia    Patient didnt wake up for 24 hours after anesthesia   Diabetes (HCC)    HTN (hypertension)    IBS (irritable bowel syndrome)    chronic diarrhea   Paresthesias    left side   Pneumonia 03/2020   Sciatica    Past Surgical History:  Procedure Laterality Date   ABDOMINAL HYSTERECTOMY     partial   CARPAL TUNNEL RELEASE     bilat, [place]   CESAREAN SECTION     COLONOSCOPY  07/18/2010   Fields-SIMPLE ADENOMA (Next colonoscopy  06/2020)   COLONOSCOPY WITH PROPOFOL N/A 08/20/2020   Procedure: COLONOSCOPY WITH PROPOFOL;  Surgeon: Lanelle Bal, DO;  [facility]: [facility];  Service: Endoscopy;  Laterality: N/A;  9:00am   TOTAL SHOULDER ARTHROPLASTY Left 01/07/2023   Procedure: LEFT TOTAL SHOULDER ARTHROPLASTY;  Surgeon: Oliver Barre, MD;  Location: [facility];  Service: Orthopedics;  Laterality: Left;   Patient Active Problem List   Diagnosis Date Noted   Arthritis of left glenohumeral joint 01/07/2023   Preoperative clearance 12/09/2022   Type 2 diabetes mellitus (HCC) 06/19/2022   Hypertension  06/02/2022   Encounter for routine adult physical exam with abnormal findings 05/19/2022   History of adenomatous polyp of colon 07/04/2020   Acute hypoxemic respiratory failure due to COVID-19 [facility]) 03/30/2020   Hepatic cirrhosis (HCC) 01/19/2018   Colon cancer screening 01/19/2018   Chronic diarrhea 12/31/2011    PCP: Rica Records, FNP  REFERRING PROVIDER: Dr. Thane Edu  ONSET DATE: 01/07/23  REFERRING DIAG: W09.811 (ICD-10-CM) - Status post total replacement of left shoulder   THERAPY DIAG:  Acute pain of left shoulder  Stiffness of left shoulder, not elsewhere classified  Other symptoms and signs involving the musculoskeletal system  Rationale for Evaluation and Treatment: Rehabilitation  SUBJECTIVE:   SUBJECTIVE STATEMENT: S: I'm doing all of my exercises, my arm just wont move more.  Pt accompanied by: self  PERTINENT HISTORY: Pt is a 63 y/o female s/p left TSA on 01/07/23. Pt received HH therapy and has transitioned to OP therapy.   PRECAUTIONS: Shoulder  WEIGHT BEARING RESTRICTIONS: No  PAIN:  Are you having pain?  Yes, my whole body except my shoulder  FALLS: Has patient fallen in last 6 months? No  PLOF: Independent  PATIENT GOALS: To use the LUE for ADLs.   NEXT MD VISIT: 03/31/23  OBJECTIVE:   HAND DOMINANCE: Ambidextrous  ADLs:  Overall ADLs: Pt reports reaching overhead and behind back is difficult. Has difficulty with dressing, bathing, meal preparation, and household tasks. Cannot complete lifting tasks yet, pt has been careful with mobility of the shoulder during ADLs.    FUNCTIONAL OUTCOME MEASURES: Quick Dash: 29.55 05/04/23: 25 05/21/23: 22.73   UPPER EXTREMITY ROM:       Assessed in sitting, er/IR adducted  Active ROM Left eval Left 05/04/23 Left 05/21/23  Shoulder flexion 65 63 69  Shoulder abduction 50 62 63  Shoulder internal rotation 90 90 90  Shoulder external rotation 51 78 86  (Blank rows = not  tested)  Assessed in supine, er/IR adducted  Active ROM Left eval  Shoulder flexion 111  Shoulder abduction 80  Shoulder internal rotation 90  Shoulder external rotation 39  (Blank rows = not tested)    UPPER EXTREMITY MMT:     Assessed in sitting, er/IR adducted  MMT Left eval Left 05/04/23 Left 05/21/23  Shoulder flexion 3-/5 3-/5 3/5  Shoulder abduction 3-/5 4-/5 4-/5  Shoulder internal rotation 3/5 4/5 4+/5  Shoulder external rotation 3/5 4-/5 4/5  (Blank rows = not tested)   EDEMA: None  COGNITION: Overall cognitive status: Within functional limits for tasks assessed  OBSERVATIONS: min/mod fascial restrictions along upper arm and anterior shoulder regions   TODAY'S TREATMENT:                                                                                                                              DATE:   05/21/23 -P/ROM: seated, flexion, abduction, er/IR, x8 -AA/ROM: seated, flexion, abduction, protraction, horizontal abduction, er/IR, x12 -A/ROM: seated, er/Ir, protraction, horizontal abduction, flexion, abduction, x10 -Proximal shoulder exercises: paddles, criss cross, circles both directions, x10 each -Measurements for reassessment  05/12/23 -Manual Therapy: myofascial release and trigger point applied to biceps, deltoid, trapezius, and scapular region in order to reduce fascial restrictions and pain, as well as improve ROM -P/ROM: supine, flexion, abduction, er/IR, x8 -AA/ROM: seated, flexion, abduction, protraction, horizontal abduction, er/IR, x12 -Proximal shoulder exercises: paddles, criss cross, circles both directions, x10 each -A/ROM: seated, flexion, protraction, er/IR, horizontal abduction, x10 -Stretching: er behind the back with towel  05/04/23 -Manual Therapy: myofascial release and trigger point applied to biceps, deltoid, trapezius, and scapular region in order to reduce fascial restrictions and pain, as well as improve ROM -P/ROM: supine,  flexion, abduction, er/IR, x8 -AA/ROM: supine, flexion, abduction, protraction, horizontal abduction, er/IR, x10 -Proximal shoulder exercises: paddles, criss cross, circles both directions, x10 each -Measurements for Reassessment   PATIENT EDUCATION: Education details: Continue HEP Person educated: Patient Education method: Programmer, multimedia, Demonstration, and Handouts Education comprehension: verbalized understanding and returned demonstration  HOME EXERCISE PROGRAM: Eval: table slides 1/16: AA/ROM 2/5: A/ROM  GOALS: Goals reviewed with patient? Yes   SHORT TERM GOALS: Target date: 04/29/23  Pt will be provided with and educated on HEP to improve mobility in LUE required for use during ADL completion.  Goal status: IN PROGRESS  2.  Pt will increase LUE P/ROM by 40+ degrees to improve ability to use LUE during dressing tasks with minimal compensatory techniques.   Goal status: IN PROGRESS  3.  Pt will increase LUE strength to 3+/5 to improve ability to reach for items at waist to chest height during bathing and grooming tasks.   Goal status: IN PROGRESS    LONG TERM GOALS: Target date: 05/28/23  Pt will decrease pain in LUE to 2/10 or less to improve ability to sleep for 2+ consecutive hours without waking due to pain.   Goal status: IN PROGRESS  2.  Pt will decrease LUE fascial restrictions to trace amounts or less to improve mobility required for functional reaching tasks.   Goal status: IN PROGRESS  3.  Pt will increase LUE A/ROM by 40+ degrees to improve ability to use LUE when reaching overhead or behind back during dressing and bathing tasks.   Goal status: IN PROGRESS  4.  Pt will increase LUE strength to 4/5 or greater to improve ability to use LUE when lifting or carrying items during meal preparation/housework/yardwork tasks.   Goal status: IN PROGRESS   ASSESSMENT:  CLINICAL IMPRESSION: Pt completed reassessment this session. She continues to have limited  ROM due to end feel range with passive ROM and weakness along biceps and deltoid. Pt often compensating with contracting her trapezius, requiring cuing to bring shoulders down and relax. Sessions continue to focus on ROM and improving functional mobility. Verbal and tactile cuing provided for positioning and technique throughout session.   PERFORMANCE DEFICITS: in functional skills including in functional skills including ADLs, IADLs, coordination, tone, ROM, strength, pain, fascial restrictions, muscle spasms, and UE functional use   PLAN:  OT FREQUENCY: 2x/week  OT DURATION: 8 weeks  PLANNED INTERVENTIONS: 97168 OT Re-evaluation, 97535 self care/ADL training, 16109 therapeutic exercise, 97530 therapeutic activity, 97140 manual therapy, 97035 ultrasound, patient/family education, and DME and/or AE instructions  RECOMMENDED OTHER SERVICES: None at this time  CONSULTED AND AGREED WITH PLAN OF CARE: Patient  PLAN FOR NEXT SESSION: Follow up on HEP, manual therapy, ROM, proximal shoulder exercises, isometrics, start stretches, start scapular strengthening   Marylouise Stacks  [phone] 05/21/2023, 12:56 PM   Managed Medicaid Authorization Request  Visit Dx Codes: B14.782, M25.612, R29.898  Functional Tool Score: Quick dash: 22.73  For all possible CPT codes, reference the Planned Interventions line above.     Check all conditions that are expected to impact treatment: {Conditions expected to impact treatment:None of these apply   If treatment provided at initial evaluation, no treatment charged due to lack of authorization.

## 2023-05-26 ENCOUNTER — Ambulatory Visit (HOSPITAL_COMMUNITY): Payer: Medicaid Other | Attending: Orthopedic Surgery | Admitting: Occupational Therapy

## 2023-05-26 ENCOUNTER — Encounter (HOSPITAL_COMMUNITY): Payer: Self-pay | Admitting: Occupational Therapy

## 2023-05-26 DIAGNOSIS — M25512 Pain in left shoulder: Secondary | ICD-10-CM | POA: Insufficient documentation

## 2023-05-26 DIAGNOSIS — M25612 Stiffness of left shoulder, not elsewhere classified: Secondary | ICD-10-CM | POA: Diagnosis not present

## 2023-05-26 DIAGNOSIS — R29898 Other symptoms and signs involving the musculoskeletal system: Secondary | ICD-10-CM | POA: Insufficient documentation

## 2023-05-26 NOTE — Therapy (Signed)
 OUTPATIENT OCCUPATIONAL THERAPY ORTHO TREATMENT NOTE   Patient Name: Megan Salazar MRN: [id number] DOB:1961/03/15, 63 y.o., female Today's Date: 05/26/2023   END OF SESSION:  OT End of Session - 05/26/23 1236     Visit Number 9    Number of Visits 16    Date for OT Re-Evaluation 06/25/23    Authorization Type [place] Medicaid    Authorization Time Period 5 visits approved (03/30/23-05/28/23), 2 visits approved (05/04/23-06/02/23),4 visits approved (05/26/22-06/26/22)    Authorization - Visit Number 1    Authorization - Number of Visits 4    OT Start Time 1147    OT Stop Time 1236    OT Time Calculation (min) 49 min    Activity Tolerance Patient tolerated treatment well    Behavior During Therapy WFL for tasks assessed/performed              Past Medical History:  Diagnosis Date   Asthma    Bone spur    Carpal tunnel syndrome    Complication of anesthesia    Patient didnt wake up for 24 hours after anesthesia   Diabetes (HCC)    HTN (hypertension)    IBS (irritable bowel syndrome)    chronic diarrhea   Paresthesias    left side   Pneumonia 03/2020   Sciatica    Past Surgical History:  Procedure Laterality Date   ABDOMINAL HYSTERECTOMY     partial   CARPAL TUNNEL RELEASE     bilat, [place]   CESAREAN SECTION     COLONOSCOPY  07/18/2010   Fields-SIMPLE ADENOMA (Next colonoscopy  06/2020)   COLONOSCOPY WITH PROPOFOL N/A 08/20/2020   Procedure: COLONOSCOPY WITH PROPOFOL;  Surgeon: Lanelle Bal, DO;  [facility]: [facility];  Service: Endoscopy;  Laterality: N/A;  9:00am   TOTAL SHOULDER ARTHROPLASTY Left 01/07/2023   Procedure: LEFT TOTAL SHOULDER ARTHROPLASTY;  Surgeon: Oliver Barre, MD;  Location: [facility];  Service: Orthopedics;  Laterality: Left;   Patient Active Problem List   Diagnosis Date Noted   Arthritis of left glenohumeral joint 01/07/2023   Preoperative clearance 12/09/2022   Type 2 diabetes mellitus (HCC) 06/19/2022   Hypertension 06/02/2022    Encounter for routine adult physical exam with abnormal findings 05/19/2022   History of adenomatous polyp of colon 07/04/2020   Acute hypoxemic respiratory failure due to COVID-19 [facility]) 03/30/2020   Hepatic cirrhosis (HCC) 01/19/2018   Colon cancer screening 01/19/2018   Chronic diarrhea 12/31/2011    PCP: Rica Records, FNP  REFERRING PROVIDER: Dr. Thane Edu  ONSET DATE: 01/07/23  REFERRING DIAG: X10.626 (ICD-10-CM) - Status post total replacement of left shoulder   THERAPY DIAG:  Acute pain of left shoulder  Stiffness of left shoulder, not elsewhere classified  Other symptoms and signs involving the musculoskeletal system  Rationale for Evaluation and Treatment: Rehabilitation  SUBJECTIVE:   SUBJECTIVE STATEMENT: S: I think I'm doing better today.  Pt accompanied by: self  PERTINENT HISTORY: Pt is a 63 y/o female s/p left TSA on 01/07/23. Pt received HH therapy and has transitioned to OP therapy.   PRECAUTIONS: Shoulder  WEIGHT BEARING RESTRICTIONS: No  PAIN:  Are you having pain?  Yes, my whole body except my shoulder  FALLS: Has patient fallen in last 6 months? No  PLOF: Independent  PATIENT GOALS: To use the LUE for ADLs.   NEXT MD VISIT: 03/31/23  OBJECTIVE:   HAND DOMINANCE: Ambidextrous  ADLs: Overall ADLs: Pt reports reaching overhead and  behind back is difficult. Has difficulty with dressing, bathing, meal preparation, and household tasks. Cannot complete lifting tasks yet, pt has been careful with mobility of the shoulder during ADLs.    FUNCTIONAL OUTCOME MEASURES: Quick Dash: 29.55 05/04/23: 25 05/21/23: 22.73   UPPER EXTREMITY ROM:       Assessed in sitting, er/IR adducted  Active ROM Left eval Left 05/04/23 Left 05/21/23  Shoulder flexion 65 63 69  Shoulder abduction 50 62 63  Shoulder internal rotation 90 90 90  Shoulder external rotation 51 78 86  (Blank rows = not tested)  Assessed in supine, er/IR adducted  Active  ROM Left eval  Shoulder flexion 111  Shoulder abduction 80  Shoulder internal rotation 90  Shoulder external rotation 39  (Blank rows = not tested)    UPPER EXTREMITY MMT:     Assessed in sitting, er/IR adducted  MMT Left eval Left 05/04/23 Left 05/21/23  Shoulder flexion 3-/5 3-/5 3/5  Shoulder abduction 3-/5 4-/5 4-/5  Shoulder internal rotation 3/5 4/5 4+/5  Shoulder external rotation 3/5 4-/5 4/5  (Blank rows = not tested)   EDEMA: None  COGNITION: Overall cognitive status: Within functional limits for tasks assessed  OBSERVATIONS: min/mod fascial restrictions along upper arm and anterior shoulder regions   TODAY'S TREATMENT:                                                                                                                              DATE:   05/26/23 -Pulleys: flexion, abduction, x60" -AA/ROM: seated, flexion, abduction, protraction, horizontal abduction, er/IR, x12 -A/ROM: seated, er/Ir, protraction, horizontal abduction, flexion, abduction, x10 -Functional Reaching: 1# flexion from counter to back of 1st cabinet x10, 1# abduction from counter to edge of 1st cabinet x10  05/21/23 -P/ROM: seated, flexion, abduction, er/IR, x8 -AA/ROM: seated, flexion, abduction, protraction, horizontal abduction, er/IR, x12 -A/ROM: seated, er/Ir, protraction, horizontal abduction, flexion, abduction, x10 -Proximal shoulder exercises: paddles, criss cross, circles both directions, x10 each -Measurements for reassessment  05/12/23 -Manual Therapy: myofascial release and trigger point applied to biceps, deltoid, trapezius, and scapular region in order to reduce fascial restrictions and pain, as well as improve ROM -P/ROM: supine, flexion, abduction, er/IR, x8 -AA/ROM: seated, flexion, abduction, protraction, horizontal abduction, er/IR, x12 -Proximal shoulder exercises: paddles, criss cross, circles both directions, x10 each -A/ROM: seated, flexion, protraction, er/IR,  horizontal abduction, x10 -Stretching: er behind the back with towel  05/04/23 -Manual Therapy: myofascial release and trigger point applied to biceps, deltoid, trapezius, and scapular region in order to reduce fascial restrictions and pain, as well as improve ROM -P/ROM: supine, flexion, abduction, er/IR, x8 -AA/ROM: supine, flexion, abduction, protraction, horizontal abduction, er/IR, x10 -Proximal shoulder exercises: paddles, criss cross, circles both directions, x10 each -Measurements for Reassessment   PATIENT EDUCATION: Education details: Continue HEP Person educated: Patient Education method: Explanation, Demonstration, and Handouts Education comprehension: verbalized understanding and returned demonstration  HOME EXERCISE PROGRAM: Eval: table slides 1/16: AA/ROM 2/5:  A/ROM  GOALS: Goals reviewed with patient? Yes   SHORT TERM GOALS: Target date: 04/29/23  Pt will be provided with and educated on HEP to improve mobility in LUE required for use during ADL completion.   Goal status: IN PROGRESS  2.  Pt will increase LUE P/ROM by 40+ degrees to improve ability to use LUE during dressing tasks with minimal compensatory techniques.   Goal status: IN PROGRESS  3.  Pt will increase LUE strength to 3+/5 to improve ability to reach for items at waist to chest height during bathing and grooming tasks.   Goal status: IN PROGRESS    LONG TERM GOALS: Target date: 05/28/23  Pt will decrease pain in LUE to 2/10 or less to improve ability to sleep for 2+ consecutive hours without waking due to pain.   Goal status: IN PROGRESS  2.  Pt will decrease LUE fascial restrictions to trace amounts or less to improve mobility required for functional reaching tasks.   Goal status: IN PROGRESS  3.  Pt will increase LUE A/ROM by 40+ degrees to improve ability to use LUE when reaching overhead or behind back during dressing and bathing tasks.   Goal status: IN PROGRESS  4.  Pt will  increase LUE strength to 4/5 or greater to improve ability to use LUE when lifting or carrying items during meal preparation/housework/yardwork tasks.   Goal status: IN PROGRESS   ASSESSMENT:  CLINICAL IMPRESSION: This session pt continues to have limited ROM less than 50%, however no pain with the limited movement. She was able to add functional reaching this session with 1# weights, which increased fatigue, requiring rest breaks, however was able to complete entire task. OT providing verbal and tactile cuing throughout session for positioning and technique.   PERFORMANCE DEFICITS: in functional skills including in functional skills including ADLs, IADLs, coordination, tone, ROM, strength, pain, fascial restrictions, muscle spasms, and UE functional use   PLAN:  OT FREQUENCY: 2x/week  OT DURATION: 8 weeks  PLANNED INTERVENTIONS: 97168 OT Re-evaluation, 97535 self care/ADL training, 16109 therapeutic exercise, 97530 therapeutic activity, 97140 manual therapy, 97035 ultrasound, patient/family education, and DME and/or AE instructions  RECOMMENDED OTHER SERVICES: None at this time  CONSULTED AND AGREED WITH PLAN OF CARE: Patient  PLAN FOR NEXT SESSION: Follow up on HEP, manual therapy, ROM, proximal shoulder exercises, isometrics, start stretches, start scapular strengthening   Trish Mage, OTR/L  [phone] 05/26/2023, 3:17 PM   Managed Medicaid Authorization Request  Visit Dx Codes: B14.782, M25.612, R29.898  Functional Tool Score: Quick dash: 22.73  For all possible CPT codes, reference the Planned Interventions line above.     Check all conditions that are expected to impact treatment: {Conditions expected to impact treatment:None of these apply   If treatment provided at initial evaluation, no treatment charged due to lack of authorization.

## 2023-06-01 ENCOUNTER — Encounter (HOSPITAL_COMMUNITY): Payer: Medicaid Other | Admitting: Occupational Therapy

## 2023-06-03 ENCOUNTER — Encounter (HOSPITAL_COMMUNITY): Admitting: Occupational Therapy

## 2023-06-03 ENCOUNTER — Other Ambulatory Visit: Payer: Self-pay | Admitting: Family Medicine

## 2023-06-04 ENCOUNTER — Ambulatory Visit (HOSPITAL_COMMUNITY): Admitting: Occupational Therapy

## 2023-06-04 ENCOUNTER — Encounter (HOSPITAL_COMMUNITY): Admitting: Occupational Therapy

## 2023-06-04 ENCOUNTER — Encounter (HOSPITAL_COMMUNITY): Payer: Self-pay | Admitting: Occupational Therapy

## 2023-06-04 DIAGNOSIS — R29898 Other symptoms and signs involving the musculoskeletal system: Secondary | ICD-10-CM

## 2023-06-04 DIAGNOSIS — M25612 Stiffness of left shoulder, not elsewhere classified: Secondary | ICD-10-CM | POA: Diagnosis not present

## 2023-06-04 DIAGNOSIS — M25512 Pain in left shoulder: Secondary | ICD-10-CM

## 2023-06-04 NOTE — Patient Instructions (Signed)

## 2023-06-04 NOTE — Therapy (Signed)
 OUTPATIENT OCCUPATIONAL THERAPY ORTHO TREATMENT NOTE   Patient Name: Megan Salazar MRN: [id number] DOB:02-Dec-1960, 63 y.o., female Today's Date: 06/04/2023   END OF SESSION:  OT End of Session - 06/04/23 1154     Visit Number 10    Number of Visits 16    Date for OT Re-Evaluation 06/25/23    Authorization Type [place] Medicaid    Authorization Time Period 5 visits approved (03/30/23-05/28/23), 2 visits approved (05/04/23-06/02/23),4 visits approved (05/26/22-06/26/22)    Authorization - Visit Number 2    Authorization - Number of Visits 4    OT Start Time 1103    OT Stop Time 1147    OT Time Calculation (min) 44 min    Activity Tolerance Patient tolerated treatment well    Behavior During Therapy WFL for tasks assessed/performed             Past Medical History:  Diagnosis Date   Asthma    Bone spur    Carpal tunnel syndrome    Complication of anesthesia    Patient didnt wake up for 24 hours after anesthesia   Diabetes (HCC)    HTN (hypertension)    IBS (irritable bowel syndrome)    chronic diarrhea   Paresthesias    left side   Pneumonia 03/2020   Sciatica    Past Surgical History:  Procedure Laterality Date   ABDOMINAL HYSTERECTOMY     partial   CARPAL TUNNEL RELEASE     bilat, [place]   CESAREAN SECTION     COLONOSCOPY  07/18/2010   Fields-SIMPLE ADENOMA (Next colonoscopy  06/2020)   COLONOSCOPY WITH PROPOFOL N/A 08/20/2020   Procedure: COLONOSCOPY WITH PROPOFOL;  Surgeon: Lanelle Bal, DO;  Location: [facility];  Service: Endoscopy;  Laterality: N/A;  9:00am   TOTAL SHOULDER ARTHROPLASTY Left 01/07/2023   Procedure: LEFT TOTAL SHOULDER ARTHROPLASTY;  Surgeon: Oliver Barre, MD;  Location: [facility];  Service: Orthopedics;  Laterality: Left;   Patient Active Problem List   Diagnosis Date Noted   Arthritis of left glenohumeral joint 01/07/2023   Preoperative clearance 12/09/2022   Type 2 diabetes mellitus (HCC) 06/19/2022   Hypertension 06/02/2022    Encounter for routine adult physical exam with abnormal findings 05/19/2022   History of adenomatous polyp of colon 07/04/2020   Acute hypoxemic respiratory failure due to COVID-19 [facility]) 03/30/2020   Hepatic cirrhosis (HCC) 01/19/2018   Colon cancer screening 01/19/2018   Chronic diarrhea 12/31/2011    PCP: Rica Records, FNP  REFERRING PROVIDER: Dr. Thane Edu  ONSET DATE: 01/07/23  REFERRING DIAG: N82.956 (ICD-10-CM) - Status post total replacement of left shoulder   THERAPY DIAG:  Acute pain of left shoulder  Stiffness of left shoulder, not elsewhere classified  Other symptoms and signs involving the musculoskeletal system  Rationale for Evaluation and Treatment: Rehabilitation  SUBJECTIVE:   SUBJECTIVE STATEMENT: S: I've had a rough week.  Pt accompanied by: self  PERTINENT HISTORY: Pt is a 63 y/o female s/p left TSA on 01/07/23. Pt received HH therapy and has transitioned to OP therapy.   PRECAUTIONS: Shoulder  WEIGHT BEARING RESTRICTIONS: No  PAIN:  Are you having pain?  Yes, my whole body except my shoulder  FALLS: Has patient fallen in last 6 months? No  PLOF: Independent  PATIENT GOALS: To use the LUE for ADLs.   NEXT MD VISIT: 03/31/23  OBJECTIVE:   HAND DOMINANCE: Ambidextrous  ADLs: Overall ADLs: Pt reports reaching overhead and behind back  is difficult. Has difficulty with dressing, bathing, meal preparation, and household tasks. Cannot complete lifting tasks yet, pt has been careful with mobility of the shoulder during ADLs.    FUNCTIONAL OUTCOME MEASURES: Quick Dash: 29.55 05/04/23: 25 05/21/23: 22.73   UPPER EXTREMITY ROM:       Assessed in sitting, er/IR adducted  Active ROM Left eval Left 05/04/23 Left 05/21/23  Shoulder flexion 65 63 69  Shoulder abduction 50 62 63  Shoulder internal rotation 90 90 90  Shoulder external rotation 51 78 86  (Blank rows = not tested)  Assessed in supine, er/IR adducted  Active ROM  Left eval  Shoulder flexion 111  Shoulder abduction 80  Shoulder internal rotation 90  Shoulder external rotation 39  (Blank rows = not tested)    UPPER EXTREMITY MMT:     Assessed in sitting, er/IR adducted  MMT Left eval Left 05/04/23 Left 05/21/23  Shoulder flexion 3-/5 3-/5 3/5  Shoulder abduction 3-/5 4-/5 4-/5  Shoulder internal rotation 3/5 4/5 4+/5  Shoulder external rotation 3/5 4-/5 4/5  (Blank rows = not tested)   EDEMA: None  COGNITION: Overall cognitive status: Within functional limits for tasks assessed  OBSERVATIONS: min/mod fascial restrictions along upper arm and anterior shoulder regions   TODAY'S TREATMENT:                                                                                                                              DATE:   06/04/23 -Pulleys: flexion, abduction, x60" -AA/ROM: seated, flexion, abduction, protraction, horizontal abduction, er/IR, x12 -Scapular Strengthening: red band, extension, retraction, rows, x10 -ABCs on the wall: green weighted ball -Theraball Exercises: basket ball, flexion, protraction, overhead press, V ups, circles both directions, x10  05/26/23 -Pulleys: flexion, abduction, x60" -AA/ROM: seated, flexion, abduction, protraction, horizontal abduction, er/IR, x12 -A/ROM: seated, er/Ir, protraction, horizontal abduction, flexion, abduction, x10 -Functional Reaching: 1# flexion from counter to back of 1st cabinet x10, 1# abduction from counter to edge of 1st cabinet x10  05/21/23 -P/ROM: seated, flexion, abduction, er/IR, x8 -AA/ROM: seated, flexion, abduction, protraction, horizontal abduction, er/IR, x12 -A/ROM: seated, er/Ir, protraction, horizontal abduction, flexion, abduction, x10 -Proximal shoulder exercises: paddles, criss cross, circles both directions, x10 each -Measurements for reassessment   PATIENT EDUCATION: Education details: Publishing rights manager Person educated: Patient Education method:  Explanation, Demonstration, and Handouts Education comprehension: verbalized understanding and returned demonstration  HOME EXERCISE PROGRAM: Eval: table slides 1/16: AA/ROM 2/5: A/ROM 3/14: Scapular Strengthening  GOALS: Goals reviewed with patient? Yes   SHORT TERM GOALS: Target date: 04/29/23  Pt will be provided with and educated on HEP to improve mobility in LUE required for use during ADL completion.   Goal status: IN PROGRESS  2.  Pt will increase LUE P/ROM by 40+ degrees to improve ability to use LUE during dressing tasks with minimal compensatory techniques.   Goal status: IN PROGRESS  3.  Pt will increase LUE strength to 3+/5 to improve  ability to reach for items at waist to chest height during bathing and grooming tasks.   Goal status: IN PROGRESS    LONG TERM GOALS: Target date: 05/28/23  Pt will decrease pain in LUE to 2/10 or less to improve ability to sleep for 2+ consecutive hours without waking due to pain.   Goal status: IN PROGRESS  2.  Pt will decrease LUE fascial restrictions to trace amounts or less to improve mobility required for functional reaching tasks.   Goal status: IN PROGRESS  3.  Pt will increase LUE A/ROM by 40+ degrees to improve ability to use LUE when reaching overhead or behind back during dressing and bathing tasks.   Goal status: IN PROGRESS  4.  Pt will increase LUE strength to 4/5 or greater to improve ability to use LUE when lifting or carrying items during meal preparation/housework/yardwork tasks.   Goal status: IN PROGRESS   ASSESSMENT:  CLINICAL IMPRESSION: This session pt continuing to have limited ROM and increased stiffness with all active movements. Per MD request, OT added strengthening exercises this session despite poor ROM, in hopes to improve both strength and ROM. She had mod difficulty with scapular strengthening due to limited ROM. OT providing verbal and tactile cuing for positioning and technique throughout  session.   PERFORMANCE DEFICITS: in functional skills including in functional skills including ADLs, IADLs, coordination, tone, ROM, strength, pain, fascial restrictions, muscle spasms, and UE functional use   PLAN:  OT FREQUENCY: 2x/week  OT DURATION: 8 weeks  PLANNED INTERVENTIONS: 97168 OT Re-evaluation, 97535 self care/ADL training, 08657 therapeutic exercise, 97530 therapeutic activity, 97140 manual therapy, 97035 ultrasound, patient/family education, and DME and/or AE instructions  RECOMMENDED OTHER SERVICES: None at this time  CONSULTED AND AGREED WITH PLAN OF CARE: Patient  PLAN FOR NEXT SESSION: Follow up on HEP, manual therapy, ROM, proximal shoulder exercises, isometrics, start stretches, start scapular strengthening   Marylouise Stacks  [phone] 06/04/2023, 11:55 AM   Managed Medicaid Authorization Request  Visit Dx Codes: U13.244, M25.612, R29.898  Functional Tool Score: Quick dash: 22.73  For all possible CPT codes, reference the Planned Interventions line above.     Check all conditions that are expected to impact treatment: {Conditions expected to impact treatment:None of these apply   If treatment provided at initial evaluation, no treatment charged due to lack of authorization.

## 2023-06-09 ENCOUNTER — Encounter (HOSPITAL_COMMUNITY): Payer: Self-pay | Admitting: Occupational Therapy

## 2023-06-09 ENCOUNTER — Ambulatory Visit (HOSPITAL_COMMUNITY): Admitting: Occupational Therapy

## 2023-06-09 DIAGNOSIS — M25612 Stiffness of left shoulder, not elsewhere classified: Secondary | ICD-10-CM

## 2023-06-09 DIAGNOSIS — M25512 Pain in left shoulder: Secondary | ICD-10-CM | POA: Diagnosis not present

## 2023-06-09 DIAGNOSIS — R29898 Other symptoms and signs involving the musculoskeletal system: Secondary | ICD-10-CM

## 2023-06-09 NOTE — Patient Instructions (Signed)

## 2023-06-09 NOTE — Therapy (Signed)
 OUTPATIENT OCCUPATIONAL THERAPY ORTHO TREATMENT NOTE   Patient Name: Megan Salazar MRN: [id number] DOB:11/27/1960, 63 y.o., female Today's Date: 06/09/2023   END OF SESSION:  OT End of Session - 06/09/23 1142     Visit Number 11    Number of Visits 16    Date for OT Re-Evaluation 06/25/23    Authorization Type [place] Medicaid    Authorization Time Period 5 visits approved (03/30/23-05/28/23), 2 visits approved (05/04/23-06/02/23),4 visits approved (05/26/22-06/26/22)    Authorization - Visit Number 3    Authorization - Number of Visits 4    OT Start Time 1103    OT Stop Time 1142    OT Time Calculation (min) 39 min    Activity Tolerance Patient tolerated treatment well    Behavior During Therapy WFL for tasks assessed/performed              Past Medical History:  Diagnosis Date   Asthma    Bone spur    Carpal tunnel syndrome    Complication of anesthesia    Patient didnt wake up for 24 hours after anesthesia   Diabetes (HCC)    HTN (hypertension)    IBS (irritable bowel syndrome)    chronic diarrhea   Paresthesias    left side   Pneumonia 03/2020   Sciatica    Past Surgical History:  Procedure Laterality Date   ABDOMINAL HYSTERECTOMY     partial   CARPAL TUNNEL RELEASE     bilat, [place]   CESAREAN SECTION     COLONOSCOPY  07/18/2010   Fields-SIMPLE ADENOMA (Next colonoscopy  06/2020)   COLONOSCOPY WITH PROPOFOL N/A 08/20/2020   Procedure: COLONOSCOPY WITH PROPOFOL;  Surgeon: Lanelle Bal, DO;  Location: [facility];  Service: Endoscopy;  Laterality: N/A;  9:00am   TOTAL SHOULDER ARTHROPLASTY Left 01/07/2023   Procedure: LEFT TOTAL SHOULDER ARTHROPLASTY;  Surgeon: Oliver Barre, MD;  Location: [facility];  Service: Orthopedics;  Laterality: Left;   Patient Active Problem List   Diagnosis Date Noted   Arthritis of left glenohumeral joint 01/07/2023   Preoperative clearance 12/09/2022   Type 2 diabetes mellitus (HCC) 06/19/2022   Hypertension 06/02/2022    Encounter for routine adult physical exam with abnormal findings 05/19/2022   History of adenomatous polyp of colon 07/04/2020   Acute hypoxemic respiratory failure due to COVID-19 (HCC) 03/30/2020   Hepatic cirrhosis (HCC) 01/19/2018   Colon cancer screening 01/19/2018   Chronic diarrhea 12/31/2011    PCP: Rica Records, FNP  REFERRING PROVIDER: Dr. Thane Edu  ONSET DATE: 01/07/23  REFERRING DIAG: I69.629 (ICD-10-CM) - Status post total replacement of left shoulder   THERAPY DIAG:  Acute pain of left shoulder  Stiffness of left shoulder, not elsewhere classified  Other symptoms and signs involving the musculoskeletal system  Rationale for Evaluation and Treatment: Rehabilitation  SUBJECTIVE:   SUBJECTIVE STATEMENT: S: I've had a rough week, I wasn't able to do as much of my exercises. Pt accompanied by: self  PERTINENT HISTORY: Pt is a 63 y/o female s/p left TSA on 01/07/23. Pt received HH therapy and has transitioned to OP therapy.   PRECAUTIONS: Shoulder  WEIGHT BEARING RESTRICTIONS: No  PAIN:  Are you having pain?  Yes, my whole body except my shoulder  FALLS: Has patient fallen in last 6 months? No  PLOF: Independent  PATIENT GOALS: To use the LUE for ADLs.   NEXT MD VISIT: 03/31/23  OBJECTIVE:   HAND DOMINANCE: Ambidextrous  ADLs: Overall ADLs: Pt reports reaching overhead and behind back is difficult. Has difficulty with dressing, bathing, meal preparation, and household tasks. Cannot complete lifting tasks yet, pt has been careful with mobility of the shoulder during ADLs.    FUNCTIONAL OUTCOME MEASURES: Quick Dash: 29.55 05/04/23: 25 05/21/23: 22.73   UPPER EXTREMITY ROM:       Assessed in sitting, er/IR adducted  Active ROM Left eval Left 05/04/23 Left 05/21/23  Shoulder flexion 65 63 69  Shoulder abduction 50 62 63  Shoulder internal rotation 90 90 90  Shoulder external rotation 51 78 86  (Blank rows = not tested)  Assessed  in supine, er/IR adducted  Active ROM Left eval  Shoulder flexion 111  Shoulder abduction 80  Shoulder internal rotation 90  Shoulder external rotation 39  (Blank rows = not tested)    UPPER EXTREMITY MMT:     Assessed in sitting, er/IR adducted  MMT Left eval Left 05/04/23 Left 05/21/23  Shoulder flexion 3-/5 3-/5 3/5  Shoulder abduction 3-/5 4-/5 4-/5  Shoulder internal rotation 3/5 4/5 4+/5  Shoulder external rotation 3/5 4-/5 4/5  (Blank rows = not tested)   EDEMA: None  COGNITION: Overall cognitive status: Within functional limits for tasks assessed  OBSERVATIONS: min/mod fascial restrictions along upper arm and anterior shoulder regions   TODAY'S TREATMENT:                                                                                                                              DATE:   06/09/23 -Pulleys: flexion, abduction, x60" -A/ROM: seated, er/Ir, protraction, horizontal abduction, flexion, abduction, x10 -PNF Strengthening: yellow band, chest pulls, er pulls, PNF up, PNF down, x10 -Scapular Strengthening: red band, extension, retraction, rows, x10 -UBE: level 1, pace: 3.0+, 2.5' forwards and backwards  06/04/23 -Pulleys: flexion, abduction, x60" -AA/ROM: seated, flexion, abduction, protraction, horizontal abduction, er/IR, x12 -Scapular Strengthening: red band, extension, retraction, rows, x10 -ABCs on the wall: green weighted ball -Theraball Exercises: basket ball, flexion, protraction, overhead press, V ups, circles both directions, x10  05/26/23 -Pulleys: flexion, abduction, x60" -AA/ROM: seated, flexion, abduction, protraction, horizontal abduction, er/IR, x12 -A/ROM: seated, er/Ir, protraction, horizontal abduction, flexion, abduction, x10 -Functional Reaching: 1# flexion from counter to back of 1st cabinet x10, 1# abduction from counter to edge of 1st cabinet x10   PATIENT EDUCATION: Education details: PNF Strengthening Person educated:  Patient Education method: Programmer, multimedia, Demonstration, and Handouts Education comprehension: verbalized understanding and returned demonstration  HOME EXERCISE PROGRAM: Eval: table slides 1/16: AA/ROM 2/5: A/ROM 3/14: Scapular Strengthening 3/19: PNF Strengthening  GOALS: Goals reviewed with patient? Yes   SHORT TERM GOALS: Target date: 04/29/23  Pt will be provided with and educated on HEP to improve mobility in LUE required for use during ADL completion.   Goal status: IN PROGRESS  2.  Pt will increase LUE P/ROM by 40+ degrees to improve ability to use LUE during dressing tasks with minimal compensatory techniques.  Goal status: IN PROGRESS  3.  Pt will increase LUE strength to 3+/5 to improve ability to reach for items at waist to chest height during bathing and grooming tasks.   Goal status: IN PROGRESS    LONG TERM GOALS: Target date: 05/28/23  Pt will decrease pain in LUE to 2/10 or less to improve ability to sleep for 2+ consecutive hours without waking due to pain.   Goal status: IN PROGRESS  2.  Pt will decrease LUE fascial restrictions to trace amounts or less to improve mobility required for functional reaching tasks.   Goal status: IN PROGRESS  3.  Pt will increase LUE A/ROM by 40+ degrees to improve ability to use LUE when reaching overhead or behind back during dressing and bathing tasks.   Goal status: IN PROGRESS  4.  Pt will increase LUE strength to 4/5 or greater to improve ability to use LUE when lifting or carrying items during meal preparation/housework/yardwork tasks.   Goal status: IN PROGRESS   ASSESSMENT:  CLINICAL IMPRESSION: Pt's ROM continues to be severely limited, limiting her movement pattern during strengthening tasks. She was able to complete new PNF exercises this session with her arms at or below 90 degrees of flexion. Pt had mild pain with abduction this session, but was able to work through it with slow ROM and stretching. Pt also  started UBE this session which she tolerated well with good endurance. Verbal and tactile cuing provided for positioning and technique throughout session.   PERFORMANCE DEFICITS: in functional skills including in functional skills including ADLs, IADLs, coordination, tone, ROM, strength, pain, fascial restrictions, muscle spasms, and UE functional use   PLAN:  OT FREQUENCY: 2x/week  OT DURATION: 8 weeks  PLANNED INTERVENTIONS: 97168 OT Re-evaluation, 97535 self care/ADL training, 56213 therapeutic exercise, 97530 therapeutic activity, 97140 manual therapy, 97035 ultrasound, patient/family education, and DME and/or AE instructions  RECOMMENDED OTHER SERVICES: None at this time  CONSULTED AND AGREED WITH PLAN OF CARE: Patient  PLAN FOR NEXT SESSION: Follow up on HEP, manual therapy, ROM, proximal shoulder exercises, isometrics, start stretches, start scapular strengthening   Trish Mage, OTR/L  [phone] 06/09/2023, 1:34 PM   Managed Medicaid Authorization Request  Visit Dx Codes: E95.284, M25.612, R29.898  Functional Tool Score: Quick dash: 22.73  For all possible CPT codes, reference the Planned Interventions line above.     Check all conditions that are expected to impact treatment: {Conditions expected to impact treatment:None of these apply   If treatment provided at initial evaluation, no treatment charged due to lack of authorization.

## 2023-06-16 ENCOUNTER — Ambulatory Visit (HOSPITAL_COMMUNITY): Admitting: Occupational Therapy

## 2023-06-16 ENCOUNTER — Encounter (HOSPITAL_COMMUNITY): Payer: Self-pay | Admitting: Occupational Therapy

## 2023-06-16 DIAGNOSIS — M25512 Pain in left shoulder: Secondary | ICD-10-CM | POA: Diagnosis not present

## 2023-06-16 DIAGNOSIS — R29898 Other symptoms and signs involving the musculoskeletal system: Secondary | ICD-10-CM

## 2023-06-16 DIAGNOSIS — M25612 Stiffness of left shoulder, not elsewhere classified: Secondary | ICD-10-CM

## 2023-06-16 NOTE — Therapy (Unsigned)
 OUTPATIENT OCCUPATIONAL THERAPY ORTHO TREATMENT NOTE   Patient Name: Megan Salazar MRN: [id number] DOB:[birth year], 63 y.o., female Today's Date: 06/17/2023   END OF SESSION:  OT End of Session - 06/16/23 1156     Visit Number 12    Number of Visits 20    Date for OT Re-Evaluation 07/30/23    Authorization Type [place] Medicaid    Authorization Time Period 5 visits approved (03/30/23-05/28/23), 2 visits approved (05/04/23-06/02/23),4 visits approved (05/26/22-06/26/22), requesting 8 more    Authorization - Visit Number 4    Authorization - Number of Visits 4    OT Start Time 1101    OT Stop Time 1156    OT Time Calculation (min) 55 min    Activity Tolerance Patient tolerated treatment well    Behavior During Therapy WFL for tasks assessed/performed             Past Medical History:  Diagnosis Date   Asthma    Bone spur    Carpal tunnel syndrome    Complication of anesthesia    Patient didnt wake up for 24 hours after anesthesia   Diabetes (HCC)    HTN (hypertension)    IBS (irritable bowel syndrome)    chronic diarrhea   Paresthesias    left side   Pneumonia 03/2020   Sciatica    Past Surgical History:  Procedure Laterality Date   ABDOMINAL HYSTERECTOMY     partial   CARPAL TUNNEL RELEASE     bilat, [place]   CESAREAN SECTION     COLONOSCOPY  07/18/2010   Fields-SIMPLE ADENOMA (Next colonoscopy  06/2020)   COLONOSCOPY WITH PROPOFOL N/A 08/20/2020   Procedure: COLONOSCOPY WITH PROPOFOL;  Surgeon: Lanelle Bal, DO;  [facility]: [facility];  Service: Endoscopy;  Laterality: N/A;  9:00am   TOTAL SHOULDER ARTHROPLASTY Left 01/07/2023   Procedure: LEFT TOTAL SHOULDER ARTHROPLASTY;  Surgeon: Oliver Barre, MD;  Location: [facility];  Service: Orthopedics;  Laterality: Left;   Patient Active Problem List   Diagnosis Date Noted   Arthritis of left glenohumeral joint 01/07/2023   Preoperative clearance 12/09/2022   Type 2 diabetes mellitus (HCC) 06/19/2022    Hypertension 06/02/2022   Encounter for routine adult physical exam with abnormal findings 05/19/2022   History of adenomatous polyp of colon 07/04/2020   Acute hypoxemic respiratory failure due to COVID-19 [facility]) 03/30/2020   Hepatic cirrhosis (HCC) 01/19/2018   Colon cancer screening 01/19/2018   Chronic diarrhea 12/31/2011    PCP: Rica Records, FNP  REFERRING PROVIDER: Dr. Thane Edu  ONSET DATE: 01/07/23  REFERRING DIAG: W09.811 (ICD-10-CM) - Status post total replacement of left shoulder   THERAPY DIAG:  Acute pain of left shoulder  Stiffness of left shoulder, not elsewhere classified  Other symptoms and signs involving the musculoskeletal system  Rationale for Evaluation and Treatment: Rehabilitation  SUBJECTIVE:   SUBJECTIVE STATEMENT: S: I think It's getting better. Pt accompanied by: self  PERTINENT HISTORY: Pt is a 63 y/o female s/p left TSA on 01/07/23. Pt received HH therapy and has transitioned to OP therapy.   PRECAUTIONS: Shoulder  WEIGHT BEARING RESTRICTIONS: No  PAIN:  Are you having pain? No  FALLS: Has patient fallen in last 6 months? No  PLOF: Independent  PATIENT GOALS: To use the LUE for ADLs.   NEXT MD VISIT: 03/31/23  OBJECTIVE:   HAND DOMINANCE: Ambidextrous  ADLs: Overall ADLs: Pt reports reaching overhead and behind back is difficult. Has difficulty with  dressing, bathing, meal preparation, and household tasks. Cannot complete lifting tasks yet, pt has been careful with mobility of the shoulder during ADLs.    FUNCTIONAL OUTCOME MEASURES: Quick Dash: 29.55 05/04/23: 25 05/21/23: 22.73 06/16/23: 20.45   UPPER EXTREMITY ROM:       Assessed in sitting, er/IR adducted  Active ROM Left eval Left 05/04/23 Left 05/21/23 Left 06/16/23  Shoulder flexion 65 63 69 78  Shoulder abduction 50 62 63 75  Shoulder internal rotation 90 90 90 90  Shoulder external rotation 51 78 86 85  (Blank rows = not tested)  Assessed in  supine, er/IR adducted  Active ROM Left eval  Shoulder flexion 111  Shoulder abduction 80  Shoulder internal rotation 90  Shoulder external rotation 39  (Blank rows = not tested)    UPPER EXTREMITY MMT:     Assessed in sitting, er/IR adducted  MMT Left eval Left 05/04/23 Left 05/21/23 Left 06/16/23  Shoulder flexion 3-/5 3-/5 3/5 4/5  Shoulder abduction 3-/5 4-/5 4-/5 4/5  Shoulder internal rotation 3/5 4/5 4+/5 5/5  Shoulder external rotation 3/5 4-/5 4/5 4+/5  (Blank rows = not tested)   EDEMA: None  COGNITION: Overall cognitive status: Within functional limits for tasks assessed  OBSERVATIONS: min/mod fascial restrictions along upper arm and anterior shoulder regions   TODAY'S TREATMENT:                                                                                                                              DATE:   06/16/23 -Pulleys: flexion, abduction, x60" -Theraball Exercises: basket ball, flexion, protraction, overhead press, V ups, circles both directions, x15 -A/ROM: seated, er/Ir, protraction, horizontal abduction, flexion, abduction, x10 -Scapular Strengthening: red band, extension, retraction, rows, x10 -ABC's in the air -reviewed ADL's at home and provided education on ways to adapt meal prep and cleaning with limited ROM. -measurements for reassessment  06/09/23 -Pulleys: flexion, abduction, x60" -A/ROM: seated, er/Ir, protraction, horizontal abduction, flexion, abduction, x10 -PNF Strengthening: yellow band, chest pulls, er pulls, PNF up, PNF down, x10 -Scapular Strengthening: red band, extension, retraction, rows, x10 -UBE: level 1, pace: 3.0+, 2.5' forwards and backwards  06/04/23 -Pulleys: flexion, abduction, x60" -AA/ROM: seated, flexion, abduction, protraction, horizontal abduction, er/IR, x12 -Scapular Strengthening: red band, extension, retraction, rows, x10 -ABCs on the wall: green weighted ball -Theraball Exercises: basket ball,  flexion, protraction, overhead press, V ups, circles both directions, x10  05/26/23 -Pulleys: flexion, abduction, x60" -AA/ROM: seated, flexion, abduction, protraction, horizontal abduction, er/IR, x12 -A/ROM: seated, er/Ir, protraction, horizontal abduction, flexion, abduction, x10 -Functional Reaching: 1# flexion from counter to back of 1st cabinet x10, 1# abduction from counter to edge of 1st cabinet x10   PATIENT EDUCATION: Education details: Theraball Exercises Person educated: Patient Education method: Explanation, Demonstration, and Handouts Education comprehension: verbalized understanding and returned demonstration  HOME EXERCISE PROGRAM: Eval: table slides 1/16: AA/ROM 2/5: A/ROM 3/14: Scapular Strengthening 3/19: PNF Strengthening  GOALS: Goals reviewed with  patient? Yes   SHORT TERM GOALS: Target date: 04/29/23  Pt will be provided with and educated on HEP to improve mobility in LUE required for use during ADL completion.   Goal status: IN PROGRESS  2.  Pt will increase LUE P/ROM by 40+ degrees to improve ability to use LUE during dressing tasks with minimal compensatory techniques.   Goal status: IN PROGRESS  3.  Pt will increase LUE strength to 3+/5 to improve ability to reach for items at waist to chest height during bathing and grooming tasks.   Goal status: MET    LONG TERM GOALS: Target date: 05/28/23  Pt will decrease pain in LUE to 2/10 or less to improve ability to sleep for 2+ consecutive hours without waking due to pain.   Goal status: MET  2.  Pt will decrease LUE fascial restrictions to trace amounts or less to improve mobility required for functional reaching tasks.   Goal status: MET  3.  Pt will increase LUE A/ROM by 40+ degrees to improve ability to use LUE when reaching overhead or behind back during dressing and bathing tasks.   Goal status: IN PROGRESS  4.  Pt will increase LUE strength to 5/5 or greater to improve ability to use LUE  when lifting or carrying items during meal preparation/housework/yardwork tasks.   Goal status: REVISED   ASSESSMENT:  CLINICAL IMPRESSION: Pt completed reassessment this session for continued recertification. She is making incremental progress with her ROM, however it is still not functional. Additionally her strength is improving well, allowing her to carry her groceries into her apartment and not drop them due to muscle fatigue or weakness. Pt continues to benefit from skilled therapy to continue addressing her limited ROM and improving her strength to functional levels, allowing her to complete all IADL's without assist or heavily compensated adaption's.   PERFORMANCE DEFICITS: in functional skills including in functional skills including ADLs, IADLs, coordination, tone, ROM, strength, pain, fascial restrictions, muscle spasms, and UE functional use   PLAN:  OT FREQUENCY: 2x/week  OT DURATION: 8 weeks  PLANNED INTERVENTIONS: 97168 OT Re-evaluation, 97535 self care/ADL training, 29562 therapeutic exercise, 97530 therapeutic activity, 97140 manual therapy, 97035 ultrasound, patient/family education, and DME and/or AE instructions  RECOMMENDED OTHER SERVICES: None at this time  CONSULTED AND AGREED WITH PLAN OF CARE: Patient  PLAN FOR NEXT SESSION: Follow up on HEP, manual therapy, ROM, proximal shoulder exercises, isometrics, start stretches, start scapular strengthening   Marylouise Stacks  [phone] 06/17/2023, 12:55 PM   Managed Medicaid Authorization Request  Visit Dx Codes: N62.952, M25.612, R29.898  Functional Tool Score: Quick dash: 20.45  For all possible CPT codes, reference the Planned Interventions line above.     Check all conditions that are expected to impact treatment: {Conditions expected to impact treatment:None of these apply   If treatment provided at initial evaluation, no treatment charged due to lack of authorization.

## 2023-06-29 ENCOUNTER — Ambulatory Visit: Payer: Medicaid Other | Admitting: Orthopedic Surgery

## 2023-06-30 DIAGNOSIS — M9913 Subluxation complex (vertebral) of lumbar region: Secondary | ICD-10-CM | POA: Diagnosis not present

## 2023-06-30 DIAGNOSIS — M9911 Subluxation complex (vertebral) of cervical region: Secondary | ICD-10-CM | POA: Diagnosis not present

## 2023-06-30 DIAGNOSIS — M542 Cervicalgia: Secondary | ICD-10-CM | POA: Diagnosis not present

## 2023-06-30 DIAGNOSIS — M9912 Subluxation complex (vertebral) of thoracic region: Secondary | ICD-10-CM | POA: Diagnosis not present

## 2023-06-30 DIAGNOSIS — M546 Pain in thoracic spine: Secondary | ICD-10-CM | POA: Diagnosis not present

## 2023-07-02 ENCOUNTER — Encounter (HOSPITAL_COMMUNITY): Payer: Self-pay | Admitting: Occupational Therapy

## 2023-07-02 ENCOUNTER — Ambulatory Visit (HOSPITAL_COMMUNITY): Attending: Orthopedic Surgery | Admitting: Occupational Therapy

## 2023-07-02 DIAGNOSIS — M25612 Stiffness of left shoulder, not elsewhere classified: Secondary | ICD-10-CM | POA: Insufficient documentation

## 2023-07-02 DIAGNOSIS — M25512 Pain in left shoulder: Secondary | ICD-10-CM | POA: Insufficient documentation

## 2023-07-02 DIAGNOSIS — R29898 Other symptoms and signs involving the musculoskeletal system: Secondary | ICD-10-CM | POA: Diagnosis not present

## 2023-07-02 NOTE — Addendum Note (Signed)
 Addended by: Ezra Sites A on: 07/02/2023 03:15 PM   Modules accepted: Orders

## 2023-07-02 NOTE — Addendum Note (Signed)
 Addended by: Ezra Sites A on: 07/02/2023 03:17 PM   Modules accepted: Orders

## 2023-07-02 NOTE — Therapy (Signed)
 OUTPATIENT OCCUPATIONAL THERAPY ORTHO TREATMENT NOTE   Patient Name: Megan Salazar MRN: [id number] DOB:07/16/1960, 63 y.o., female Today's Date: 07/02/2023   END OF SESSION:  OT End of Session - 07/02/23 1345     Visit Number 13    Number of Visits 20    Date for OT Re-Evaluation 07/30/23    Authorization Type HB Medicaid    Authorization Time Period 5 visits approved (03/30/23-05/28/23), 2 visits approved (05/04/23-06/02/23),4 visits approved (05/26/22-06/26/22), requesting 8 more    Authorization - Visit Number 1    Authorization - Number of Visits 4    OT Start Time 1400    OT Stop Time 1442    OT Time Calculation (min) 42 min    Activity Tolerance Patient tolerated treatment well    Behavior During Therapy WFL for tasks assessed/performed              Past Medical History:  Diagnosis Date   Asthma    Bone spur    Carpal tunnel syndrome    Complication of anesthesia    Patient didnt wake up for 24 hours after anesthesia   Diabetes (HCC)    HTN (hypertension)    IBS (irritable bowel syndrome)    chronic diarrhea   Paresthesias    left side   Pneumonia 03/2020   Sciatica    Past Surgical History:  Procedure Laterality Date   ABDOMINAL HYSTERECTOMY     partial   CARPAL TUNNEL RELEASE     bilat, [place]   CESAREAN SECTION     COLONOSCOPY  07/18/2010   Fields-SIMPLE ADENOMA (Next colonoscopy  06/2020)   COLONOSCOPY WITH PROPOFOL N/A 08/20/2020   Procedure: COLONOSCOPY WITH PROPOFOL;  Surgeon: Lanelle Bal, DO;  [facility]: [facility];  Service: Endoscopy;  Laterality: N/A;  9:00am   TOTAL SHOULDER ARTHROPLASTY Left 01/07/2023   Procedure: LEFT TOTAL SHOULDER ARTHROPLASTY;  Surgeon: Oliver Barre, MD;  Location: [facility];  Service: Orthopedics;  Laterality: Left;   Patient Active Problem List   Diagnosis Date Noted   Arthritis of left glenohumeral joint 01/07/2023   Preoperative clearance 12/09/2022   Type 2 diabetes mellitus (HCC) 06/19/2022    Hypertension 06/02/2022   Encounter for routine adult physical exam with abnormal findings 05/19/2022   History of adenomatous polyp of colon 07/04/2020   Acute hypoxemic respiratory failure due to COVID-19 (HCC) 03/30/2020   Hepatic cirrhosis (HCC) 01/19/2018   Colon cancer screening 01/19/2018   Chronic diarrhea 12/31/2011    PCP: Rica Records, FNP  REFERRING PROVIDER: Dr. Thane Edu  ONSET DATE: 01/07/23  REFERRING DIAG: X32.440 (ICD-10-CM) - Status post total replacement of left shoulder   THERAPY DIAG:  Acute pain of left shoulder  Stiffness of left shoulder, not elsewhere classified  Other symptoms and signs involving the musculoskeletal system  Rationale for Evaluation and Treatment: Rehabilitation  SUBJECTIVE:   SUBJECTIVE STATEMENT: S: . Pt accompanied by: self  PERTINENT HISTORY: Pt is a 63 y/o female s/p left TSA on 01/07/23. Pt received HH therapy and has transitioned to OP therapy.   PRECAUTIONS: Shoulder  WEIGHT BEARING RESTRICTIONS: No  PAIN:  Are you having pain? No  FALLS: Has patient fallen in last 6 months? No  PLOF: Independent  PATIENT GOALS: To use the LUE for ADLs.   NEXT MD VISIT: 03/31/23  OBJECTIVE:   HAND DOMINANCE: Ambidextrous  ADLs: Overall ADLs: Pt reports reaching overhead and behind back is difficult. Has difficulty with dressing, bathing, meal  preparation, and household tasks. Cannot complete lifting tasks yet, pt has been careful with mobility of the shoulder during ADLs.    FUNCTIONAL OUTCOME MEASURES: Quick Dash: 29.55 05/04/23: 25 05/21/23: 22.73 06/16/23: 20.45   UPPER EXTREMITY ROM:       Assessed in sitting, er/IR adducted  Active ROM Left eval Left 05/04/23 Left 05/21/23 Left 06/16/23  Shoulder flexion 65 63 69 78  Shoulder abduction 50 62 63 75  Shoulder internal rotation 90 90 90 90  Shoulder external rotation 51 78 86 85  (Blank rows = not tested)  Assessed in supine, er/IR  adducted  Active ROM Left eval  Shoulder flexion 111  Shoulder abduction 80  Shoulder internal rotation 90  Shoulder external rotation 39  (Blank rows = not tested)    UPPER EXTREMITY MMT:     Assessed in sitting, er/IR adducted  MMT Left eval Left 05/04/23 Left 05/21/23 Left 06/16/23  Shoulder flexion 3-/5 3-/5 3/5 4/5  Shoulder abduction 3-/5 4-/5 4-/5 4/5  Shoulder internal rotation 3/5 4/5 4+/5 5/5  Shoulder external rotation 3/5 4-/5 4/5 4+/5  (Blank rows = not tested)   EDEMA: None  COGNITION: Overall cognitive status: Within functional limits for tasks assessed  OBSERVATIONS: min/mod fascial restrictions along upper arm and anterior shoulder regions   TODAY'S TREATMENT:                                                                                                                              DATE:   07/02/23 -UE strength: bicep curls, hammer curls, punches across body with 2 pound dumb bells.  - Proximal strength: paddles, criss cross, circles, opposite circles 30 seconds each 2 sets - Stretch: flexion stretch at wall 30 seconds 2 sets  -UBE: level 1, pace: 3.0+, 2.5' forwards and backwards  06/16/23 -Pulleys: flexion, abduction, x60" -Theraball Exercises: basket ball, flexion, protraction, overhead press, V ups, circles both directions, x15 -A/ROM: seated, er/Ir, protraction, horizontal abduction, flexion, abduction, x10 -Scapular Strengthening: red band, extension, retraction, rows, x10 -ABC's in the air -reviewed ADL's at home and provided education on ways to adapt meal prep and cleaning with limited ROM. -measurements for reassessment  06/09/23 -Pulleys: flexion, abduction, x60" -A/ROM: seated, er/Ir, protraction, horizontal abduction, flexion, abduction, x10 -PNF Strengthening: yellow band, chest pulls, er pulls, PNF up, PNF down, x10 -Scapular Strengthening: red band, extension, retraction, rows, x10 -UBE: level 1, pace: 3.0+, 2.5' forwards and  backwards  PATIENT EDUCATION: Education details: Theraball Exercises Person educated: Patient Education method: Explanation, Demonstration, and Handouts Education comprehension: verbalized understanding and returned demonstration  HOME EXERCISE PROGRAM: Eval: table slides 1/16: AA/ROM 2/5: A/ROM 3/14: Scapular Strengthening 3/19: PNF Strengthening  GOALS: Goals reviewed with patient? Yes   SHORT TERM GOALS: Target date: 04/29/23  Pt will be provided with and educated on HEP to improve mobility in LUE required for use during ADL completion.   Goal status: IN PROGRESS  2.  Pt will increase  LUE P/ROM by 40+ degrees to improve ability to use LUE during dressing tasks with minimal compensatory techniques.   Goal status: IN PROGRESS  3.  Pt will increase LUE strength to 3+/5 to improve ability to reach for items at waist to chest height during bathing and grooming tasks.   Goal status: MET    LONG TERM GOALS: Target date: 05/28/23  Pt will decrease pain in LUE to 2/10 or less to improve ability to sleep for 2+ consecutive hours without waking due to pain.   Goal status: MET  2.  Pt will decrease LUE fascial restrictions to trace amounts or less to improve mobility required for functional reaching tasks.   Goal status: MET  3.  Pt will increase LUE A/ROM by 40+ degrees to improve ability to use LUE when reaching overhead or behind back during dressing and bathing tasks.   Goal status: IN PROGRESS  4.  Pt will increase LUE strength to 5/5 or greater to improve ability to use LUE when lifting or carrying items during meal preparation/housework/yardwork tasks.   Goal status: REVISED   ASSESSMENT:  CLINICAL IMPRESSION: Pt reports she is eager to get back to swimming. Worked on UE strength, pt demonstrates ease with bicep based activities but was unable to complete shoulder press movement (without weight). Discussed 3 remaining appts and then we will discharge.    PERFORMANCE DEFICITS: in functional skills including in functional skills including ADLs, IADLs, coordination, tone, ROM, strength, pain, fascial restrictions, muscle spasms, and UE functional use   PLAN:  OT FREQUENCY: 2x/week  OT DURATION: 8 weeks  PLANNED INTERVENTIONS: 97168 OT Re-evaluation, 97535 self care/ADL training, 60454 therapeutic exercise, 97530 therapeutic activity, 97140 manual therapy, 97035 ultrasound, patient/family education, and DME and/or AE instructions  RECOMMENDED OTHER SERVICES: None at this time  CONSULTED AND AGREED WITH PLAN OF CARE: Patient  PLAN FOR NEXT SESSION: Follow up on HEP, manual therapy, ROM, proximal shoulder exercises, isometrics, start stretches, start scapular strengthening   Lurena Joiner, OTR/L  [phone] 07/02/2023, 1:46 PM

## 2023-07-09 ENCOUNTER — Ambulatory Visit (HOSPITAL_COMMUNITY): Admitting: Occupational Therapy

## 2023-07-09 ENCOUNTER — Encounter (HOSPITAL_COMMUNITY): Payer: Self-pay | Admitting: Occupational Therapy

## 2023-07-09 DIAGNOSIS — M25612 Stiffness of left shoulder, not elsewhere classified: Secondary | ICD-10-CM

## 2023-07-09 DIAGNOSIS — M25512 Pain in left shoulder: Secondary | ICD-10-CM | POA: Diagnosis not present

## 2023-07-09 DIAGNOSIS — R29898 Other symptoms and signs involving the musculoskeletal system: Secondary | ICD-10-CM | POA: Diagnosis not present

## 2023-07-09 NOTE — Therapy (Signed)
 OUTPATIENT OCCUPATIONAL THERAPY ORTHO TREATMENT NOTE   Patient Name: Megan Salazar MRN: [id number] DOB:01/03/1961, 63 y.o., female Today's Date: 07/09/2023   END OF SESSION:  OT End of Session - 07/09/23 1141     Visit Number 14    Number of Visits 20    Date for OT Re-Evaluation 07/30/23    Authorization Type HB Medicaid    Authorization Time Period 5 visits approved (03/30/23-05/28/23), 2 visits approved (05/04/23-06/02/23),4 visits approved (05/26/22-06/26/22), requesting 8 more    Authorization - Visit Number 2    Authorization - Number of Visits 4    OT Start Time 1105    OT Stop Time 1145    OT Time Calculation (min) 40 min    Activity Tolerance Patient tolerated treatment well    Behavior During Therapy WFL for tasks assessed/performed               Past Medical History:  Diagnosis Date   Asthma    Bone spur    Carpal tunnel syndrome    Complication of anesthesia    Patient didnt wake up for 24 hours after anesthesia   Diabetes (HCC)    HTN (hypertension)    IBS (irritable bowel syndrome)    chronic diarrhea   Paresthesias    left side   Pneumonia 03/2020   Sciatica    Past Surgical History:  Procedure Laterality Date   ABDOMINAL HYSTERECTOMY     partial   CARPAL TUNNEL RELEASE     bilat, [place]   CESAREAN SECTION     COLONOSCOPY  07/18/2010   Fields-SIMPLE ADENOMA (Next colonoscopy  06/2020)   COLONOSCOPY WITH PROPOFOL  N/A 08/20/2020   Procedure: COLONOSCOPY WITH PROPOFOL ;  Surgeon: Cindie Carlin POUR, DO;  [facility];  Service: Endoscopy;  Laterality: N/A;  9:00am   TOTAL SHOULDER ARTHROPLASTY Left 01/07/2023   Procedure: LEFT TOTAL SHOULDER ARTHROPLASTY;  Surgeon: Onesimo [name] DELENA, MD;  Location: [facility];  Service: Orthopedics;  Laterality: Left;   Patient Active Problem List   Diagnosis Date Noted   Arthritis of left glenohumeral joint 01/07/2023   Preoperative clearance 12/09/2022   Type 2 diabetes mellitus (HCC) 06/19/2022    Hypertension 06/02/2022   Encounter for routine adult physical exam with abnormal findings 05/19/2022   History of adenomatous polyp of colon 07/04/2020   Acute hypoxemic respiratory failure due to COVID-19 (HCC) 03/30/2020   Hepatic cirrhosis (HCC) 01/19/2018   Colon cancer screening 01/19/2018   Chronic diarrhea 12/31/2011    PCP: Hilario Terry Wilhelmena Lloyd, FNP  REFERRING PROVIDER: Dr. [name] Onesimo  ONSET DATE: 01/07/23  REFERRING DIAG: S03.387 (ICD-10-CM) - Status post total replacement of left shoulder   THERAPY DIAG:  Acute pain of left shoulder  Stiffness of left shoulder, not elsewhere classified  Other symptoms and signs involving the musculoskeletal system  Rationale for Evaluation and Treatment: Rehabilitation  SUBJECTIVE:   SUBJECTIVE STATEMENT: S: I am starting to use my L arm to reach into second level shelves  Pt accompanied by: self  PERTINENT HISTORY: Pt is a 63 y/o female s/p left TSA on 01/07/23. Pt received HH therapy and has transitioned to OP therapy.   PRECAUTIONS: Shoulder  WEIGHT BEARING RESTRICTIONS: No  PAIN:  Are you having pain? No  FALLS: Has patient fallen in last 6 months? No  PLOF: Independent  PATIENT GOALS: To use the LUE for ADLs.   NEXT MD VISIT: 03/31/23  OBJECTIVE:   HAND DOMINANCE: Ambidextrous  ADLs: Overall ADLs:  Pt reports reaching overhead and behind back is difficult. Has difficulty with dressing, bathing, meal preparation, and household tasks. Cannot complete lifting tasks yet, pt has been careful with mobility of the shoulder during ADLs.    FUNCTIONAL OUTCOME MEASURES: Quick Dash: 29.55 05/04/23: 25 05/21/23: 22.73 06/16/23: 20.45   UPPER EXTREMITY ROM:       Assessed in sitting, er/IR adducted  Active ROM Left eval Left 05/04/23 Left 05/21/23 Left 06/16/23  Shoulder flexion 65 63 69 78  Shoulder abduction 50 62 63 75  Shoulder internal rotation 90 90 90 90  Shoulder external rotation 51 78 86 85   (Blank rows = not tested)  Assessed in supine, er/IR adducted  Active ROM Left eval  Shoulder flexion 111  Shoulder abduction 80  Shoulder internal rotation 90  Shoulder external rotation 39  (Blank rows = not tested)    UPPER EXTREMITY MMT:     Assessed in sitting, er/IR adducted  MMT Left eval Left 05/04/23 Left 05/21/23 Left 06/16/23  Shoulder flexion 3-/5 3-/5 3/5 4/5  Shoulder abduction 3-/5 4-/5 4-/5 4/5  Shoulder internal rotation 3/5 4/5 4+/5 5/5  Shoulder external rotation 3/5 4-/5 4/5 4+/5  (Blank rows = not tested)   EDEMA: None  COGNITION: Overall cognitive status: Within functional limits for tasks assessed  OBSERVATIONS: min/mod fascial restrictions along upper arm and anterior shoulder regions   TODAY'S TREATMENT:                                                                                                                              DATE:   07/09/23 -P/ROM: flexion, abduction achieving 75% ROM 10x - AA/ROM: in sitting; flexion, abduction 10x (75% ROM with flexion, 50% ROM abduction)  -Pulley: 1 minute flexion 1 minute abduction  -Proximal stability: paddles, criss cross, circles, opposite circles 30 seconds each 2 sets - UBE: level 1, pace: 3.0+, 2.5' forwards and backwards -OH reach: placing 6 cones into lower level cabinet independently and placing into 2nd shelf with min assist at end of reach    07/02/23 -UE strength: bicep curls, hammer curls, punches across body with 2 pound dumb bells.  - Proximal strength: paddles, criss cross, circles, opposite circles 30 seconds each 2 sets - Stretch: flexion stretch at wall 30 seconds 2 sets  -UBE: level 1, pace: 3.0+, 2.5' forwards and backwards  06/16/23 -Pulleys: flexion, abduction, x60 -Theraball Exercises: basket ball, flexion, protraction, overhead press, V ups, circles both directions, x15 -A/ROM: seated, er/Ir, protraction, horizontal abduction, flexion, abduction, x10 -Scapular  Strengthening: red band, extension, retraction, rows, x10 -ABC's in the air -reviewed ADL's at home and provided education on ways to adapt meal prep and cleaning with limited ROM. -measurements for reassessment   PATIENT EDUCATION: Education details: continuing reaching activities  Person educated: Patient Education method: Explanation, Demonstration, and Handouts Education comprehension: verbalized understanding and returned demonstration  HOME EXERCISE PROGRAM: Eval: table slides 1/16: AA/ROM 2/5: A/ROM 3/14: Scapular  Strengthening 3/19: PNF Strengthening  GOALS: Goals reviewed with patient? Yes   SHORT TERM GOALS: Target date: 04/29/23  Pt will be provided with and educated on HEP to improve mobility in LUE required for use during ADL completion.   Goal status: IN PROGRESS  2.  Pt will increase LUE P/ROM by 40+ degrees to improve ability to use LUE during dressing tasks with minimal compensatory techniques.   Goal status: IN PROGRESS  3.  Pt will increase LUE strength to 3+/5 to improve ability to reach for items at waist to chest height during bathing and grooming tasks.   Goal status: MET    LONG TERM GOALS: Target date: 05/28/23  Pt will decrease pain in LUE to 2/10 or less to improve ability to sleep for 2+ consecutive hours without waking due to pain.   Goal status: MET  2.  Pt will decrease LUE fascial restrictions to trace amounts or less to improve mobility required for functional reaching tasks.   Goal status: MET  3.  Pt will increase LUE A/ROM by 40+ degrees to improve ability to use LUE when reaching overhead or behind back during dressing and bathing tasks.   Goal status: IN PROGRESS  4.  Pt will increase LUE strength to 5/5 or greater to improve ability to use LUE when lifting or carrying items during meal preparation/housework/yardwork tasks.   Goal status: REVISED   ASSESSMENT:  CLINICAL IMPRESSION: Pt reports that she has been using her  left arm to overhead reach into cabinets. Focused on flexion stretch at start of session. Pt able to achieve 75% ROM with flexion and 50% ROM with AA/ROM seated. Continued with endurance and strengthening activities. VC throughout session for body mechanics and form.   PERFORMANCE DEFICITS: in functional skills including in functional skills including ADLs, IADLs, coordination, tone, ROM, strength, pain, fascial restrictions, muscle spasms, and UE functional use   PLAN:  OT FREQUENCY: 2x/week  OT DURATION: 8 weeks  PLANNED INTERVENTIONS: 97168 OT Re-evaluation, 97535 self care/ADL training, 02889 therapeutic exercise, 97530 therapeutic activity, 97140 manual therapy, 97035 ultrasound, patient/family education, and DME and/or AE instructions  RECOMMENDED OTHER SERVICES: None at this time  CONSULTED AND AGREED WITH PLAN OF CARE: Patient  PLAN FOR NEXT SESSION: Follow up on HEP, manual therapy, ROM, proximal shoulder exercises, isometrics, start stretches, start scapular strengthening   Chiquita Sermon, OTR/L  [phone] 07/09/2023, 11:42 AM

## 2023-07-13 ENCOUNTER — Other Ambulatory Visit (INDEPENDENT_AMBULATORY_CARE_PROVIDER_SITE_OTHER): Payer: Self-pay

## 2023-07-13 ENCOUNTER — Ambulatory Visit: Admitting: Orthopedic Surgery

## 2023-07-13 ENCOUNTER — Encounter: Payer: Self-pay | Admitting: Orthopedic Surgery

## 2023-07-13 DIAGNOSIS — Z96612 Presence of left artificial shoulder joint: Secondary | ICD-10-CM

## 2023-07-13 NOTE — Progress Notes (Signed)
 Orthopaedic Postop Note  Assessment: Megan Salazar is a 63 y.o. female s/p Left Anatomic Total Shoulder Arthroplasty  DOS: 01/07/2023  Plan: Megan Salazar has very little pain.  She continues to work with therapy.  She is doing exercises on her own.  She is struggling with overhead motion, which could be an indication of rotator cuff failure.  At this point, she is very pleased with her improvements.  She denies pain.  She is hopeful that she can continue to do her activities.  She states she is getting better on a daily basis.  She is going to resume swimming in a couple months.  Nothing further needed at this time.  I would like to see her back in 6 months.  We briefly discussed the possibility of a revision if she is unhappy with her motion.  She states understanding.  Follow-up: Return in about 6 months (around 01/12/2024).  XR at next visit: Left shoulder  Subjective:  Chief Complaint  Patient presents with   Routine Post Op    L TSA DOS: 01/07/2023    History of Present Illness: Megan Salazar is a 63 y.o. female who presents following the above stated procedure.  Surgery was approximately 6 months ago.  She continues to work with therapy.  She has no pain in her left shoulder.  She still notes some stiffness.  She has difficulty with overhead motion.  She notes improved function on a daily basis.  She is currently pleased with her improvements.  She has not returned to swimming, but states that she will be able to start swimming again in a couple of months, primarily due to physical constraints.  Review of Systems: No fevers or chills No numbness or tingling No Chest Pain No shortness of breath   Objective: There were no vitals taken for this visit.  Physical Exam:  Alert and oriented.  No acute distress.  Left shoulder surgical incision has healed.  No surrounding erythema or drainage.  Active forward flexion limited to approximately 90 degrees.   Passively I can get her to 140 degrees in a seated position.  45 degrees of external rotation at her side.  Sensation is intact throughout the left shoulder, including the axillary nerve distribution.  Fingers are warm and well-perfused.  IMAGING: I personally ordered and reviewed the following images:  X-rays of the left shoulder were obtained in clinic today.  These are compared to prior x-rays.  Anatomic shoulder arthroplasty in stable alignment.  No evidence of loosening.  Shoulder joint is reduced.  Marker for the glenoid remains in unchanged alignment.  No subsidence of the implants.  Impression: Left shoulder arthroplasty in stable alignment   Tonita Frater, MD 07/13/2023 10:28 AM

## 2023-07-19 ENCOUNTER — Encounter (HOSPITAL_COMMUNITY): Payer: Self-pay | Admitting: Occupational Therapy

## 2023-07-19 ENCOUNTER — Ambulatory Visit (HOSPITAL_COMMUNITY): Admitting: Occupational Therapy

## 2023-07-19 DIAGNOSIS — M25512 Pain in left shoulder: Secondary | ICD-10-CM | POA: Diagnosis not present

## 2023-07-19 DIAGNOSIS — R29898 Other symptoms and signs involving the musculoskeletal system: Secondary | ICD-10-CM | POA: Diagnosis not present

## 2023-07-19 DIAGNOSIS — M25612 Stiffness of left shoulder, not elsewhere classified: Secondary | ICD-10-CM | POA: Diagnosis not present

## 2023-07-19 NOTE — Patient Instructions (Signed)
  1) Flexion Wall Stretch    Face wall, place affected handon wall in front of you. Slide hand up the wall  and lean body in towards the wall. Hold for 10 seconds. Repeat 3-5 times. 1-2 times/day.     2) Towel Stretch with Internal Rotation     Gently pull up (or to the side) your affected arm  behind your back with the assist of a towel. Hold 10 seconds, repeat 3-5 times. 1-2 times/day.             3) Corner Stretch    Stand at a corner of a wall, place your arms on the walls with elbows bent. Lean into the corner until a stretch is felt along the front of your chest and/or shoulders. Hold for 10 seconds. Repeat 3-5X, 1-2 times/day.    4) Posterior Capsule Stretch    Bring the involved arm across chest. Grasp elbow and pull toward chest until you feel a stretch in the back of the upper arm and shoulder. Hold 10 seconds. Repeat 3-5X. Complete 1-2 times/day.

## 2023-07-19 NOTE — Therapy (Signed)
 OUTPATIENT OCCUPATIONAL THERAPY ORTHO TREATMENT NOTE REASSESSMENT AND DISCHARGE   Patient Name: Megan Salazar MRN: [id number] DOB:24-Feb-1961, 63 y.o., female Today's Date: 07/19/2023  OCCUPATIONAL THERAPY DISCHARGE SUMMARY  Visits from Start of Care: 15  Current functional level related to goals / functional outcomes: See below. Pt demonstrating approximately 50% ROM, strength has improved. Pt is at plateau and insurance has denied additional visits.    Remaining deficits: Decreased ROM, strength, and activity tolerance   Education / Equipment: HEP   Patient agrees to discharge. Patient goals were partially met. Patient is being discharged due to lack of progress..     END OF SESSION:  OT End of Session - 07/19/23 1412     Visit Number 15    Number of Visits 20    Date for OT Re-Evaluation 07/30/23    Authorization Type [place] Medicaid    Authorization Time Period 4 visits approved (05/26/22-06/26/22), requesting 8 more-insurance denied    Authorization - Visit Number 3    Authorization - Number of Visits 4    OT Start Time 1346    OT Stop Time 1411    OT Time Calculation (min) 25 min    Activity Tolerance Patient tolerated treatment well    Behavior During Therapy WFL for tasks assessed/performed                Past Medical History:  Diagnosis Date   Asthma    Bone spur    Carpal tunnel syndrome    Complication of anesthesia    Patient didnt wake up for 24 hours after anesthesia   Diabetes (HCC)    HTN (hypertension)    IBS (irritable bowel syndrome)    chronic diarrhea   Paresthesias    left side   Pneumonia 03/2020   Sciatica    Past Surgical History:  Procedure Laterality Date   ABDOMINAL HYSTERECTOMY     partial   CARPAL TUNNEL RELEASE     bilat, [place]   CESAREAN SECTION     COLONOSCOPY  07/18/2010   Fields-SIMPLE ADENOMA (Next colonoscopy  06/2020)   COLONOSCOPY WITH PROPOFOL  N/A 08/20/2020   Procedure: COLONOSCOPY WITH PROPOFOL ;   Surgeon: Vinetta Greening, DO;  Location: [facility];  Service: Endoscopy;  Laterality: N/A;  9:00am   TOTAL SHOULDER ARTHROPLASTY Left 01/07/2023   Procedure: LEFT TOTAL SHOULDER ARTHROPLASTY;  Surgeon: Tonita Frater, MD;  Location: [facility];  Service: Orthopedics;  Laterality: Left;   Patient Active Problem List   Diagnosis Date Noted   Arthritis of left glenohumeral joint 01/07/2023   Preoperative clearance 12/09/2022   Type 2 diabetes mellitus (HCC) 06/19/2022   Hypertension 06/02/2022   Encounter for routine adult physical exam with abnormal findings 05/19/2022   History of adenomatous polyp of colon 07/04/2020   Acute hypoxemic respiratory failure due to COVID-19 (HCC) 03/30/2020   Hepatic cirrhosis (HCC) 01/19/2018   Colon cancer screening 01/19/2018   Chronic diarrhea 12/31/2011    PCP: Rosanna Comment, FNP  REFERRING PROVIDER: Dr. Sharol Decamp  ONSET DATE: 01/07/23  REFERRING DIAG: J88.416 (ICD-10-CM) - Status post total replacement of left shoulder   THERAPY DIAG:  Acute pain of left shoulder  Stiffness of left shoulder, not elsewhere classified  Other symptoms and signs involving the musculoskeletal system  Rationale for Evaluation and Treatment: Rehabilitation  SUBJECTIVE:   SUBJECTIVE STATEMENT: S: "I'm using the arm and not even thinking about it."    PERTINENT HISTORY: Pt is a 63 y/o  female s/p left TSA on 01/07/23. Pt received HH therapy and has transitioned to OP therapy.   PRECAUTIONS: Shoulder  WEIGHT BEARING RESTRICTIONS: No  PAIN:  Are you having pain? No  FALLS: Has patient fallen in last 6 months? No  PLOF: Independent  PATIENT GOALS: To use the LUE for ADLs.   OBJECTIVE:   HAND DOMINANCE: Ambidextrous  ADLs: Overall ADLs: Pt reports reaching overhead and behind back is difficult. Has difficulty with dressing, bathing, meal preparation, and household tasks. Cannot complete lifting tasks yet, pt has been careful with  mobility of the shoulder during ADLs.    FUNCTIONAL OUTCOME MEASURES: Quick Dash: 29.55 05/04/23: 25 05/21/23: 22.73 06/16/23: 20.45 07/19/23: 83.75-UEFS   UPPER EXTREMITY ROM:       Assessed in sitting, er/IR adducted  Active ROM Left eval Left 05/04/23 Left 05/21/23 Left 06/16/23 Left 07/19/23  Shoulder flexion 65 63 69 78 75  Shoulder abduction 50 62 63 75 65  Shoulder internal rotation 90 90 90 90 90  Shoulder external rotation 51 78 86 85 72  (Blank rows = not tested)  Assessed in supine, er/IR adducted  Active ROM Left eval  Shoulder flexion 111  Shoulder abduction 80  Shoulder internal rotation 90  Shoulder external rotation 39  (Blank rows = not tested)    UPPER EXTREMITY MMT:     Assessed in sitting, er/IR adducted  MMT Left eval Left 05/04/23 Left 05/21/23 Left 06/16/23 Left 07/19/23  Shoulder flexion 3-/5 3-/5 3/5 4/5 4-/5  Shoulder abduction 3-/5 4-/5 4-/5 4/5 3-/5  Shoulder internal rotation 3/5 4/5 4+/5 5/5 5/5  Shoulder external rotation 3/5 4-/5 4/5 4+/5 4+/5  (Blank rows = not tested)   OBSERVATIONS: min/mod fascial restrictions along upper arm and anterior shoulder regions   TODAY'S TREATMENT:                                                                                                                              DATE:  07/19/23 -Shoulder stretches: flexion, doorway stretch, posterior capsule stretch, IR behind back with horizontal towel, 2x10" holds  07/09/23 -P/ROM: flexion, abduction achieving 75% ROM 10x - AA/ROM: in sitting; flexion, abduction 10x (75% ROM with flexion, 50% ROM abduction)  -Pulley: 1 minute flexion 1 minute abduction  -Proximal stability: paddles, criss cross, circles, opposite circles 30 seconds each 2 sets - UBE: level 1, pace: 3.0+, 2.5' forwards and backwards -OH reach: placing 6 cones into lower level cabinet independently and placing into 2nd shelf with min assist at end of reach    07/02/23 -UE strength: bicep  curls, hammer curls, punches across body with 2 pound dumb bells.  - Proximal strength: paddles, criss cross, circles, opposite circles 30 seconds each 2 sets - Stretch: flexion stretch at wall 30 seconds 2 sets  -UBE: level 1, pace: 3.0+, 2.5' forwards and backwards    PATIENT EDUCATION: Education details: shoulder stretches Person educated: Patient Education method: Explanation, Demonstration, and  Handouts Education comprehension: verbalized understanding and returned demonstration  HOME EXERCISE PROGRAM: Eval: table slides 1/16: AA/ROM 2/5: A/ROM 3/14: Scapular Strengthening 3/19: PNF Strengthening 4/28: shoulder stretches  GOALS: Goals reviewed with patient? Yes   SHORT TERM GOALS: Target date: 04/29/23  Pt will be provided with and educated on HEP to improve mobility in LUE required for use during ADL completion.   Goal status: MET  2.  Pt will increase LUE P/ROM by 40+ degrees to improve ability to use LUE during dressing tasks with minimal compensatory techniques.   Goal status: MET  3.  Pt will increase LUE strength to 3+/5 to improve ability to reach for items at waist to chest height during bathing and grooming tasks.   Goal status: MET    LONG TERM GOALS: Target date: 05/28/23  Pt will decrease pain in LUE to 2/10 or less to improve ability to sleep for 2+ consecutive hours without waking due to pain.   Goal status: MET  2.  Pt will decrease LUE fascial restrictions to trace amounts or less to improve mobility required for functional reaching tasks.   Goal status: MET  3.  Pt will increase LUE A/ROM by 40+ degrees to improve ability to use LUE when reaching overhead or behind back during dressing and bathing tasks.   Goal status: NOT MET  4.  Pt will increase LUE strength to 5/5 or greater to improve ability to use LUE when lifting or carrying items during meal preparation/housework/yardwork tasks.   Goal status: PARTIALLY  MET   ASSESSMENT:  CLINICAL IMPRESSION: Reassessment completed this session, pt has met all STGs and 2/4 LTGs, with 1 additional LTG partially met. Pt is at a plateau in her progress with ROM, strength has improved and pt is using the LUE as non-dominant during ADLs with little to no difficulty. Pt is agreeable to discharge today with HEP, as insurance has denied additional visits. Reviewed current HEP and added shoulder stretches, pt reports she will be starting back at the pool in June.     PERFORMANCE DEFICITS: in functional skills including in functional skills including ADLs, IADLs, coordination, tone, ROM, strength, pain, fascial restrictions, muscle spasms, and UE functional use   PLAN:  OT FREQUENCY: 2x/week  OT DURATION: 8 weeks  PLANNED INTERVENTIONS: 97168 OT Re-evaluation, 97535 self care/ADL training, 57846 therapeutic exercise, 97530 therapeutic activity, 97140 manual therapy, 97035 ultrasound, patient/family education, and DME and/or AE instructions  CONSULTED AND AGREED WITH PLAN OF CARE: Patient  PLAN FOR NEXT SESSION: N/A-discharge pt   Lafonda Piety, OTR/L  [phone] 07/19/2023, 2:12 PM

## 2023-07-21 ENCOUNTER — Encounter (HOSPITAL_COMMUNITY): Admitting: Occupational Therapy

## 2023-07-26 ENCOUNTER — Encounter (HOSPITAL_COMMUNITY): Admitting: Occupational Therapy

## 2023-07-30 ENCOUNTER — Other Ambulatory Visit: Payer: Self-pay | Admitting: Family Medicine

## 2023-07-30 ENCOUNTER — Telehealth: Payer: Self-pay | Admitting: Family Medicine

## 2023-07-30 DIAGNOSIS — E559 Vitamin D deficiency, unspecified: Secondary | ICD-10-CM

## 2023-07-30 DIAGNOSIS — E038 Other specified hypothyroidism: Secondary | ICD-10-CM

## 2023-07-30 DIAGNOSIS — I10 Essential (primary) hypertension: Secondary | ICD-10-CM

## 2023-07-30 DIAGNOSIS — E119 Type 2 diabetes mellitus without complications: Secondary | ICD-10-CM

## 2023-07-30 NOTE — Telephone Encounter (Signed)
Called no answer left voice mail to call back

## 2023-07-30 NOTE — Telephone Encounter (Signed)
 Ordered

## 2023-07-30 NOTE — Telephone Encounter (Signed)
 Please ask patient what was her last Trulicity  dose we have here on file 3 mg not 0.75 mg

## 2023-07-30 NOTE — Telephone Encounter (Signed)
 Okay thank you

## 2023-07-30 NOTE — Telephone Encounter (Signed)
 Copied from CRM [id number]. Topic: Clinical - Request for Lab/Test Order >> Jul 30, 2023 11:29 AM Lizabeth Riggs wrote: Reason for CRM:  Megan Salazar wants to come in 2 to 3 days prior to her appointment on June 5 to complete her labs. There is no orders in her chart for labs. Please call [facility] to set up an appointment for labs. Thanks

## 2023-08-03 NOTE — Telephone Encounter (Signed)
 Please ask patient what was her last Trulicity  dose we have here on file 3 mg not 0.75 mg

## 2023-08-11 DIAGNOSIS — M546 Pain in thoracic spine: Secondary | ICD-10-CM | POA: Diagnosis not present

## 2023-08-11 DIAGNOSIS — M9902 Segmental and somatic dysfunction of thoracic region: Secondary | ICD-10-CM | POA: Diagnosis not present

## 2023-08-11 DIAGNOSIS — M542 Cervicalgia: Secondary | ICD-10-CM | POA: Diagnosis not present

## 2023-08-11 DIAGNOSIS — M62830 Muscle spasm of back: Secondary | ICD-10-CM | POA: Diagnosis not present

## 2023-08-11 DIAGNOSIS — M9901 Segmental and somatic dysfunction of cervical region: Secondary | ICD-10-CM | POA: Diagnosis not present

## 2023-08-11 DIAGNOSIS — M9903 Segmental and somatic dysfunction of lumbar region: Secondary | ICD-10-CM | POA: Diagnosis not present

## 2023-08-26 ENCOUNTER — Encounter: Payer: Self-pay | Admitting: Family Medicine

## 2023-08-26 ENCOUNTER — Ambulatory Visit: Payer: Medicaid Other | Admitting: Family Medicine

## 2023-08-26 VITALS — BP 150/89 | HR 67 | Ht 64.0 in | Wt 208.0 lb

## 2023-08-26 DIAGNOSIS — I10 Essential (primary) hypertension: Secondary | ICD-10-CM

## 2023-08-26 DIAGNOSIS — E119 Type 2 diabetes mellitus without complications: Secondary | ICD-10-CM | POA: Diagnosis not present

## 2023-08-26 DIAGNOSIS — E559 Vitamin D deficiency, unspecified: Secondary | ICD-10-CM | POA: Diagnosis not present

## 2023-08-26 DIAGNOSIS — E038 Other specified hypothyroidism: Secondary | ICD-10-CM

## 2023-08-26 NOTE — Patient Instructions (Signed)

## 2023-08-26 NOTE — Assessment & Plan Note (Addendum)
 Last Hemoglobin A1c: 6.5 Labs: Ordered today, results pending; will follow up accordingly. Patient does not want to take medication- only taking herbal supplements  I Advise the importance of medication compliance to reduce uncontrolled diabetes complications   Reviewed non-pharmacological interventions, including a balanced diet rich in lean proteins, healthy fats, whole grains, and high-fiber vegetables. Emphasized reducing refined sugars and processed carbohydrates, and incorporating more fruits, leafy greens, and legumes. Education: Patient was educated on recognizing signs and symptoms of both hypoglycemia and hyperglycemia, and advised to seek emergency care if these symptoms occur. Follow-Up: Scheduled for follow-up in 3-4 months, or sooner if needed. Patient Understanding: The patient verbalized understanding of the care plan, and all questions were answered. Additional Care: Ophthalmology referral was placed. Foot exam results were within normal limits.

## 2023-08-26 NOTE — Assessment & Plan Note (Addendum)
 Vitals:   08/26/23 0812 08/26/23 0832  BP: (!) 158/79 (!) 150/89  Not controlled, Patient does not want to take medication. Pt was previously on olmesartan -hydrochlorothiazide  20-12.5 mg once a day.  She reports taking OTC herbal supplements, I Advise the importance of medication compliance to reduce cardiovascular risks. Labs ordered. Discussed with  patient to monitor their blood pressure regularly and maintain a heart-healthy diet rich in fruits, vegetables, whole grains, and low-fat dairy, while reducing sodium intake to less than 2,300 mg per day. Regular physical activity, such as 30 minutes of moderate exercise most days of the week, will help lower blood pressure and improve overall cardiovascular health. Avoiding smoking, limiting alcohol consumption, and managing stress. Take  prescribed medication, & take it as directed and avoid skipping doses. Seek emergency care if your blood pressure is (over 180/100) or you experience chest pain, shortness of breath, or sudden vision changes.Patient verbalizes understanding regarding plan of care and all questions answered.

## 2023-08-26 NOTE — Progress Notes (Signed)
 Established Patient Office Visit   Subjective  Patient ID: Megan Salazar, female    DOB: 09-24-1960  Age: 63 y.o. MRN: [id number]  Chief Complaint  Patient presents with   Care Management    Four month follow up     She  has a past medical history of Asthma, Bone spur, Carpal tunnel syndrome, Complication of anesthesia, Diabetes (HCC), HTN (hypertension), IBS (irritable bowel syndrome), Paresthesias, Pneumonia (03/2020), and Sciatica.  HPI Patient presents to the clinic for chronic follow up. For the details of today's visit, please refer to assessment and plan.   Review of Systems  Constitutional:  Negative for chills and fever.  Eyes:  Negative for blurred vision.  Respiratory:  Negative for shortness of breath.   Cardiovascular:  Negative for chest pain.  Genitourinary:  Negative for dysuria.  Neurological:  Negative for dizziness and headaches.      Objective:     BP (!) 150/89   Pulse 67   Ht 5\' 4"  (1.626 m)   Wt 208 lb (94.3 kg)   SpO2 95%   BMI 35.70 kg/m  BP Readings from Last 3 Encounters:  08/26/23 (!) 150/89  01/08/23 118/64  12/31/22 (!) 104/58      Physical Exam Vitals reviewed.  Constitutional:      General: She is not in acute distress.    Appearance: Normal appearance. She is not ill-appearing, toxic-appearing or diaphoretic.  HENT:     Head: Normocephalic.  Eyes:     General:        Right eye: No discharge.        Left eye: No discharge.     Conjunctiva/sclera: Conjunctivae normal.  Cardiovascular:     Rate and Rhythm: Normal rate.     Pulses: Normal pulses.     Heart sounds: Normal heart sounds.  Pulmonary:     Effort: Pulmonary effort is normal. No respiratory distress.     Breath sounds: Normal breath sounds.  Skin:    General: Skin is warm and dry.     Capillary Refill: Capillary refill takes less than 2 seconds.  Neurological:     Mental Status: She is alert.     Coordination: Coordination normal.     Gait: Gait  normal.  Psychiatric:        Mood and Affect: Mood normal.        Behavior: Behavior normal.      No results found for any visits on 08/26/23.  The ASCVD Risk score (Arnett DK, et al., 2019) failed to calculate for the following reasons:   The valid total cholesterol range is 130 to 320 mg/dL    Assessment & Plan:  Primary hypertension Assessment & Plan: Vitals:   08/26/23 0812 08/26/23 0832  BP: (!) 158/79 (!) 150/89  Not controlled, Patient does not want to take medication. Pt was previously on olmesartan -hydrochlorothiazide  20-12.5 mg once a day.  She reports taking OTC herbal supplements, I Advise the importance of medication compliance to reduce cardiovascular risks. Labs ordered. Discussed with  patient to monitor their blood pressure regularly and maintain a heart-healthy diet rich in fruits, vegetables, whole grains, and low-fat dairy, while reducing sodium intake to less than 2,300 mg per day. Regular physical activity, such as 30 minutes of moderate exercise most days of the week, will help lower blood pressure and improve overall cardiovascular health. Avoiding smoking, limiting alcohol consumption, and managing stress. Take  prescribed medication, & take it as directed and avoid skipping  doses. Seek emergency care if your blood pressure is (over 180/100) or you experience chest pain, shortness of breath, or sudden vision changes.Patient verbalizes understanding regarding plan of care and all questions answered.   Orders: -     Lipid panel -     BMP8+eGFR -     CBC with Differential/Platelet  TSH (thyroid-stimulating hormone deficiency) -     TSH + free T4  Type 2 diabetes mellitus without complication, without long-term current use of insulin  (HCC) Assessment & Plan: Last Hemoglobin A1c: 6.5 Labs: Ordered today, results pending; will follow up accordingly. Patient does not want to take medication- only taking herbal supplements  I Advise the importance of medication  compliance to reduce uncontrolled diabetes complications   Reviewed non-pharmacological interventions, including a balanced diet rich in lean proteins, healthy fats, whole grains, and high-fiber vegetables. Emphasized reducing refined sugars and processed carbohydrates, and incorporating more fruits, leafy greens, and legumes. Education: Patient was educated on recognizing signs and symptoms of both hypoglycemia and hyperglycemia, and advised to seek emergency care if these symptoms occur. Follow-Up: Scheduled for follow-up in 3-4 months, or sooner if needed. Patient Understanding: The patient verbalized understanding of the care plan, and all questions were answered. Additional Care: Ophthalmology referral was placed. Foot exam results were within normal limits.   Orders: -     Hemoglobin A1c -     Ambulatory referral to Ophthalmology -     Microalbumin / creatinine urine ratio  Vitamin D  deficiency -     VITAMIN D  25 Hydroxy (Vit-D Deficiency, Fractures)    Return in about 4 months (around 12/26/2023), or if symptoms worsen or fail to improve, for type 2 diabetes, hypertension.   Avelino Lek Amber Bail, FNP

## 2023-08-28 LAB — LIPID PANEL

## 2023-08-31 ENCOUNTER — Ambulatory Visit: Payer: Self-pay | Admitting: Family Medicine

## 2023-08-31 NOTE — Progress Notes (Signed)
 Please inform patient   Hemoglobin A1c  5.9  prediabetes range -    Diet tips:  Eat More:  Whole grains: Oats, quinoa, brown rice. Vegetables: Leafy greens, broccoli, green beans. Fruits: Low-sugar like berries, apples. Lean proteins: Chicken, fish, beans, eggs. Healthy fats: Nuts, seeds, avocado, olive oil.  Limit:  Refined carbs: White bread, pastries. Sugary foods: Soda, candy, desserts. Fried and fatty foods.  Tips:  Eat balanced meals with portion control. Stay hydrated (water  over sugary drinks). Pair with regular exercise (e.g., walking). Example: Grilled chicken, quinoa, and steamed broccoli.  Combine diet with regular physical activity (e.g., 30 minutes of walking daily or 5 times per week.)     Cholesterol levels elevated, start lifestyle modifications and follow diet low in saturated fat.  Diet to Lower Cholesterol Eat More: Oats, beans, and lentils: High in soluble fiber. Fatty fish: Salmon, tuna (rich in omega-3s). Nuts and seeds: Almonds, walnuts, flaxseeds. Fruits and vegetables: Apples, berries, leafy greens. Healthy fats: Olive oil, avocado. Limit: Saturated fats: Butter, cream, fatty meats. Trans fats: Fried foods, processed snacks. Sugar and refined carbs: Sweets, white bread. Focus on whole foods, healthy fats, and fiber to improve heart health! Maintain an exercise routine 3 to 5 days a week for a minimum total of 150 minutes.    Kidney function normal, Thyroid panel normal

## 2023-09-03 ENCOUNTER — Other Ambulatory Visit (HOSPITAL_COMMUNITY): Payer: Self-pay | Admitting: Family Medicine

## 2023-09-03 DIAGNOSIS — Z1231 Encounter for screening mammogram for malignant neoplasm of breast: Secondary | ICD-10-CM

## 2023-09-04 LAB — CBC WITH DIFFERENTIAL/PLATELET
Basophils Absolute: 0 10*3/uL (ref 0.0–0.2)
Basos: 0 %
EOS (ABSOLUTE): 0.1 10*3/uL (ref 0.0–0.4)
Eos: 2 %
Hematocrit: 41.3 % (ref 34.0–46.6)
Hemoglobin: 13.1 g/dL (ref 11.1–15.9)
Immature Grans (Abs): 0 10*3/uL (ref 0.0–0.1)
Immature Granulocytes: 0 %
Lymphocytes Absolute: 2.4 10*3/uL (ref 0.7–3.1)
Lymphs: 33 %
MCH: 29.8 pg (ref 26.6–33.0)
MCHC: 31.7 g/dL (ref 31.5–35.7)
MCV: 94 fL (ref 79–97)
Monocytes Absolute: 0.7 10*3/uL (ref 0.1–0.9)
Monocytes: 10 %
Neutrophils Absolute: 3.9 10*3/uL (ref 1.4–7.0)
Neutrophils: 55 %
Platelets: 183 10*3/uL (ref 150–450)
RBC: 4.39 x10E6/uL (ref 3.77–5.28)
RDW: 13 % (ref 11.7–15.4)
WBC: 7.1 10*3/uL (ref 3.4–10.8)

## 2023-09-04 LAB — BMP8+EGFR
BUN/Creatinine Ratio: 22 (ref 12–28)
BUN: 23 mg/dL (ref 8–27)
CO2: 20 mmol/L (ref 20–29)
Calcium: 9.3 mg/dL (ref 8.7–10.3)
Chloride: 101 mmol/L (ref 96–106)
Creatinine, Ser: 1.03 mg/dL — ABNORMAL HIGH (ref 0.57–1.00)
Glucose: 124 mg/dL — ABNORMAL HIGH (ref 70–99)
Potassium: 4 mmol/L (ref 3.5–5.2)
Sodium: 138 mmol/L (ref 134–144)
eGFR: 61 mL/min/{1.73_m2} (ref >59)

## 2023-09-04 LAB — TSH+FREE T4
Free T4: 1.1 ng/dL (ref 0.82–1.77)
TSH: 1.42 u[IU]/mL (ref 0.450–4.500)

## 2023-09-04 LAB — LIPID PANEL
Chol/HDL Ratio: 3.6 ratio (ref 0.0–4.4)
Cholesterol, Total: 178 mg/dL (ref 100–199)
HDL: 50 mg/dL (ref >39)
LDL Chol Calc (NIH): 111 mg/dL — ABNORMAL HIGH (ref 0–99)
Triglycerides: 95 mg/dL (ref 0–149)
VLDL Cholesterol Cal: 17 mg/dL (ref 5–40)

## 2023-09-04 LAB — MICROALBUMIN / CREATININE URINE RATIO
Creatinine, Urine: 117.1 mg/dL
Microalb/Creat Ratio: 7 mg/g{creat} (ref 0–29)
Microalbumin, Urine: 7.7 ug/mL

## 2023-09-04 LAB — HEMOGLOBIN A1C
Est. average glucose Bld gHb Est-mCnc: 123 mg/dL
Hgb A1c MFr Bld: 5.9 % — ABNORMAL HIGH (ref 4.8–5.6)

## 2023-09-04 LAB — VITAMIN D 25 HYDROXY (VIT D DEFICIENCY, FRACTURES)

## 2023-09-14 NOTE — Progress Notes (Signed)
 Please inform patient,  Hemoglobin A1c levels 5.9 prediabetes range    Cholesterol levels elevated, start lifestyle modifications and follow diet low in saturated fat.  Diet to Lower Cholesterol Eat More: Oats, beans, and lentils: High in soluble fiber. Fatty fish: Salmon, tuna (rich in omega-3s). Nuts and seeds: Almonds, walnuts, flaxseeds. Fruits and vegetables: Apples, berries, leafy greens. Healthy fats: Olive oil, avocado. Limit: Saturated fats: Butter, cream, fatty meats. Trans fats: Fried foods, processed snacks. Sugar and refined carbs: Sweets, white bread. Focus on whole foods, healthy fats, and fiber to improve heart health! Maintain an exercise routine 3 to 5 days a week for a minimum total of 150 minutes.    Kidney and thyroid labs normal

## 2023-09-27 ENCOUNTER — Ambulatory Visit (HOSPITAL_COMMUNITY)
Admission: RE | Admit: 2023-09-27 | Discharge: 2023-09-27 | Disposition: A | Discharge status: Home / Self Care | Source: Ambulatory Visit | Attending: Family Medicine | Admitting: Family Medicine

## 2023-09-27 DIAGNOSIS — Z1231 Encounter for screening mammogram for malignant neoplasm of breast: Secondary | ICD-10-CM | POA: Insufficient documentation

## 2023-10-25 IMAGING — MG MM DIGITAL SCREENING BILAT W/ TOMO AND CAD
8 of 15 series · 8 of 40 positions shown · non-contrast
Comparison: Previous exam(s).

ACR Breast Density Category a: The breast tissue is almost entirely
fatty.

CLINICAL DATA: Screening.

EXAM:
DIGITAL SCREENING BILATERAL MAMMOGRAM WITH TOMOSYNTHESIS AND CAD
TECHNIQUE: Bilateral screening digital craniocaudal and mediolateral oblique
mammograms were obtained. Bilateral screening digital breast
tomosynthesis was performed. The images were evaluated with
computer-aided detection.

[L MLO synth-2D (1 of 2)]
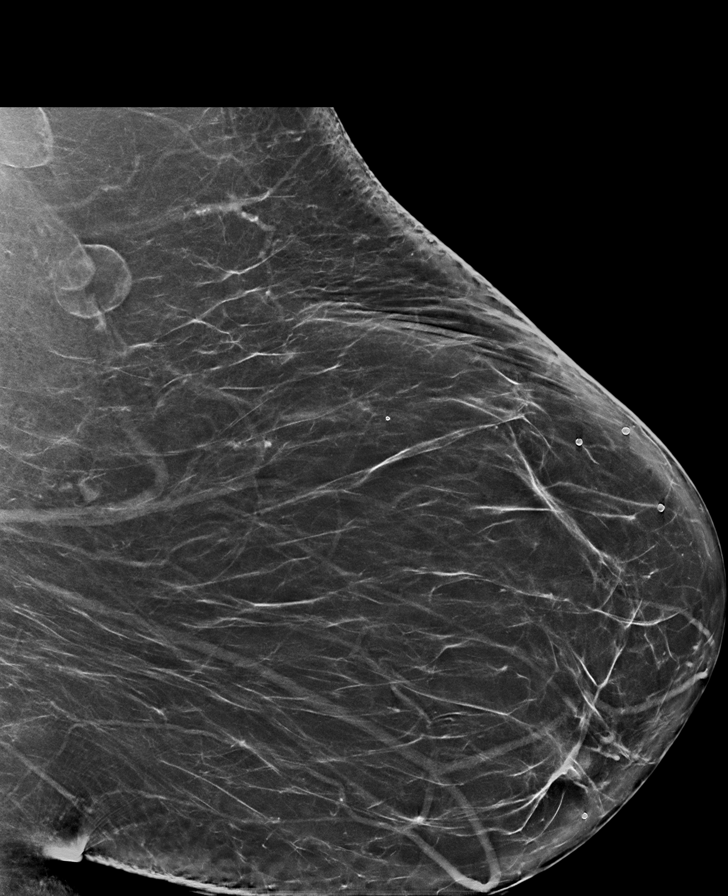

[R CC synth-2D (1 of 2)]
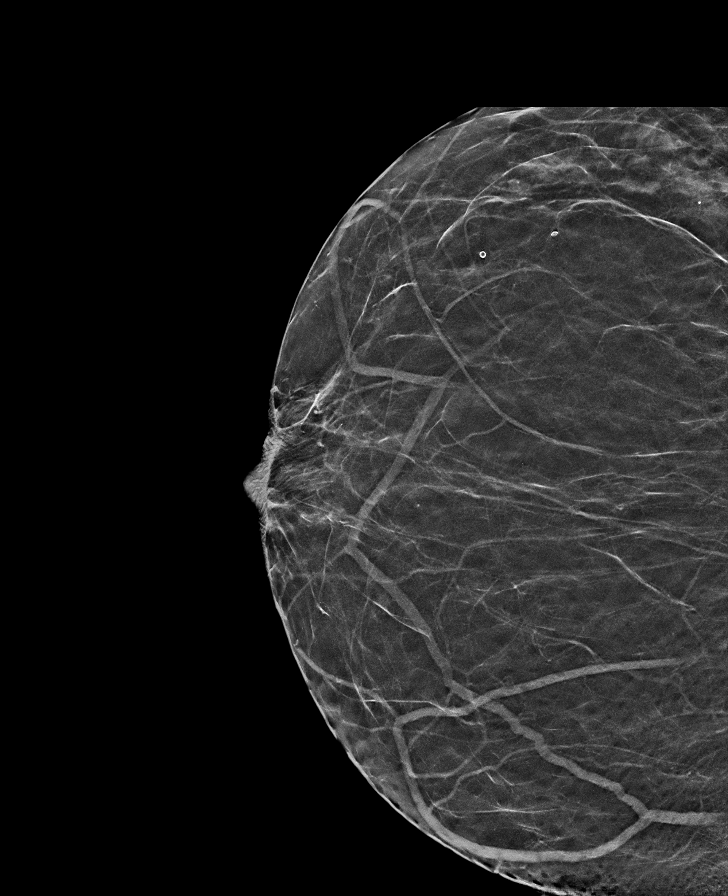

[L CC synth-2D (1 of 2)]
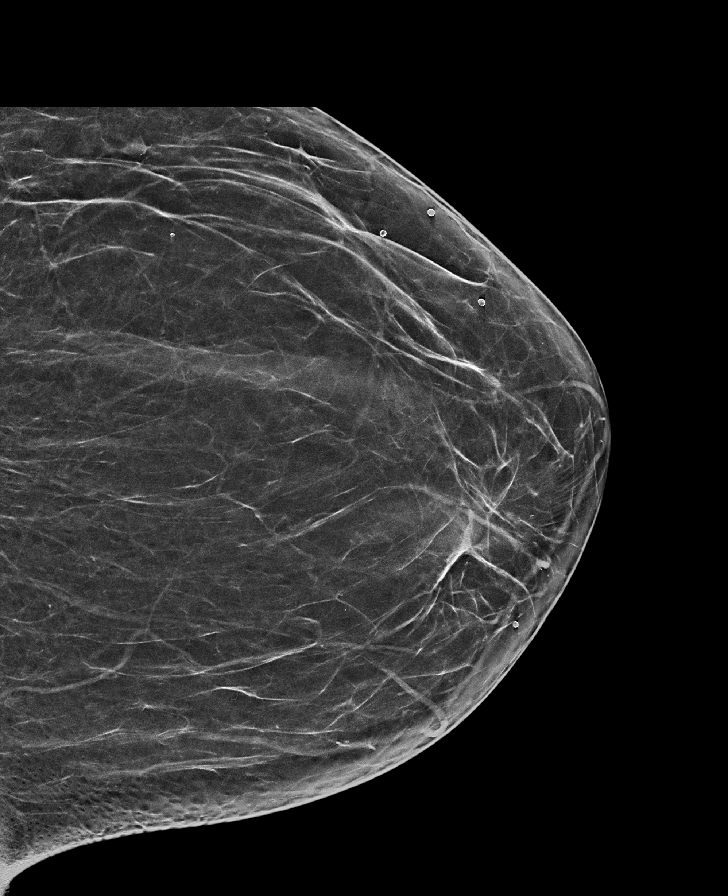

[L CC synth-2D (2 of 2)]
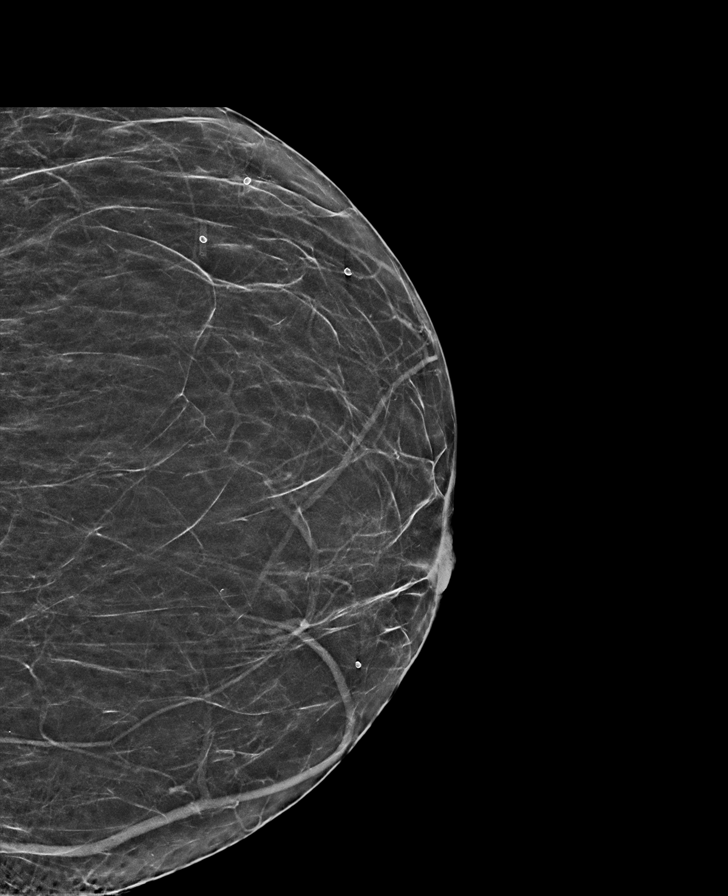

[R CC synth-2D (2 of 2)]
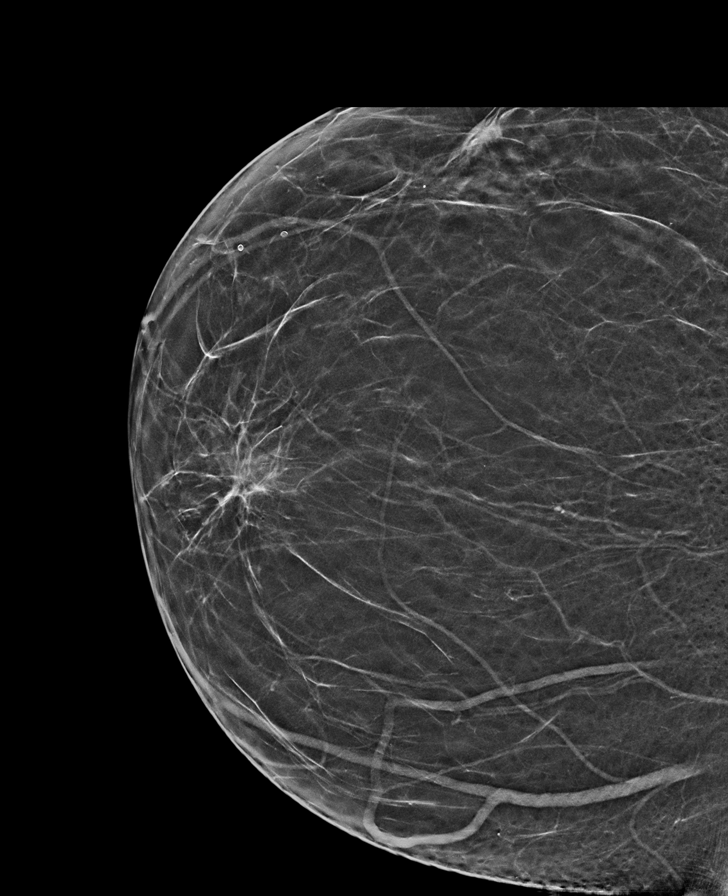

[L MLO synth-2D (2 of 2)]
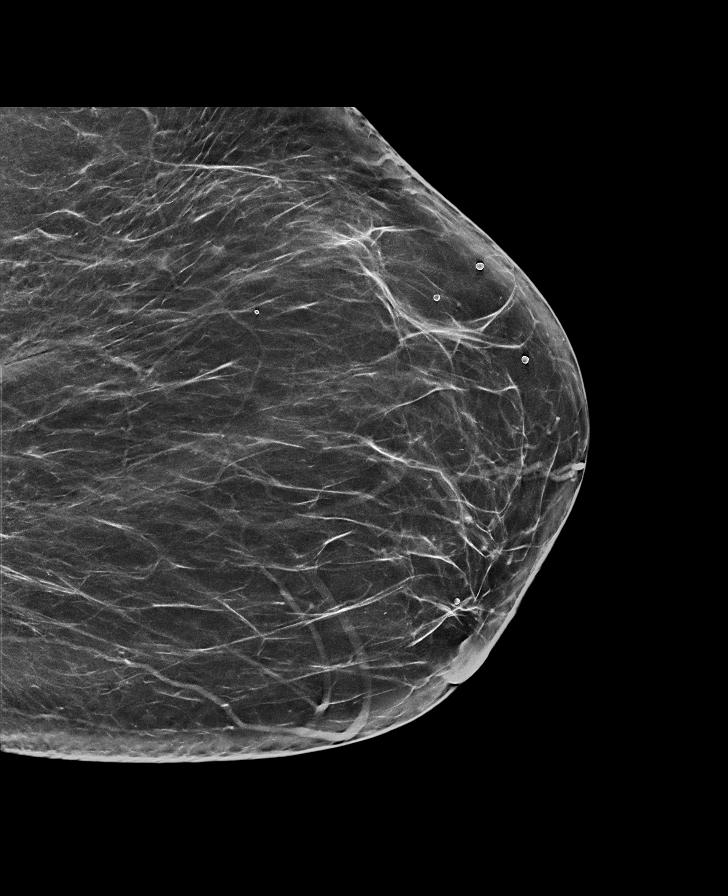

[R MLO synth-2D]
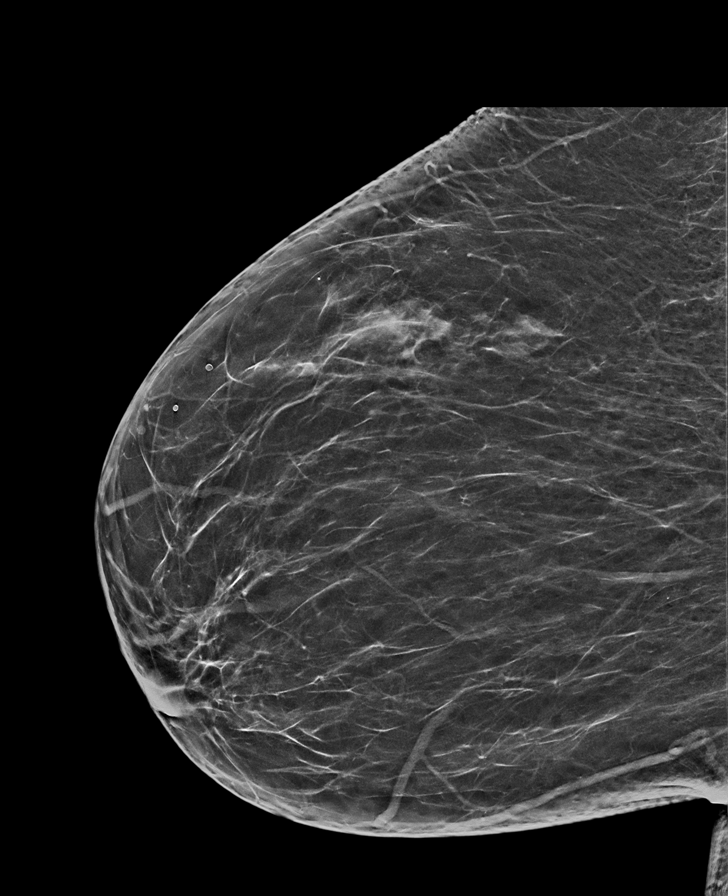

[L CC tomo · tomo slice 39/57.0]
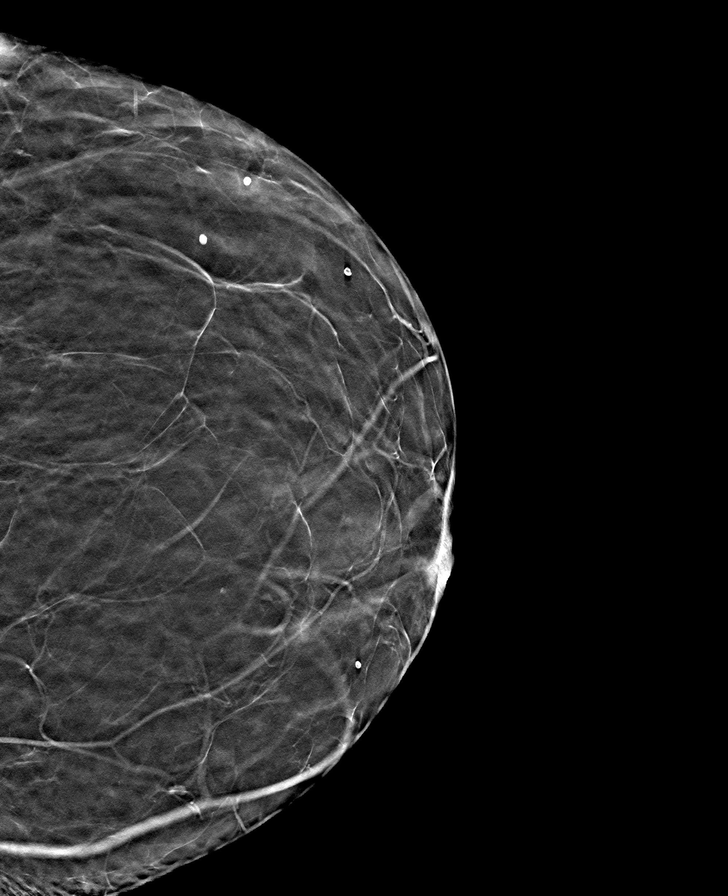

[8 of 40 positions shown; findings below may reference images not displayed]

FINDINGS: There are no findings suspicious for malignancy.
IMPRESSION: No mammographic evidence of malignancy. A result letter of this
screening mammogram will be mailed directly to the patient.

RECOMMENDATION:
Screening mammogram in one year. (Code:41-2-AUT)

BI-RADS CATEGORY  1: Negative.

*Please Insert Correct Screening Template

## 2024-01-12 ENCOUNTER — Ambulatory Visit: Admitting: Orthopedic Surgery

## 2024-01-13 ENCOUNTER — Ambulatory Visit: Admitting: Orthopedic Surgery

## 2024-01-18 ENCOUNTER — Encounter: Payer: Self-pay | Admitting: Orthopedic Surgery

## 2024-01-18 ENCOUNTER — Ambulatory Visit: Admitting: Orthopedic Surgery

## 2024-01-18 ENCOUNTER — Other Ambulatory Visit (INDEPENDENT_AMBULATORY_CARE_PROVIDER_SITE_OTHER): Payer: Self-pay

## 2024-01-18 DIAGNOSIS — Z96612 Presence of left artificial shoulder joint: Secondary | ICD-10-CM

## 2024-01-18 NOTE — Progress Notes (Signed)
 Orthopaedic Postop Note  Assessment: Megan Salazar is a 63 y.o. female s/p Left Anatomic Total Shoulder Arthroplasty  DOS: 01/07/2023  Plan: Megan Salazar is very happy with the results.  She has returned to swimming.  She is not as aggressive as she was before, but she is able to swim on a regular basis.  Radiographs remain stable.  She is not having any pain.  She is lacking some motion in the left shoulder, but she is pleased with her improvements.  She briefly mentioned that she has occasional right shoulder pain.  If she has any issues with either shoulder, I am happy to see her in clinic.  Otherwise, follow-up as needed.  Follow-up: Return if symptoms worsen or fail to improve.  XR at next visit: Left shoulder  Subjective:  Chief Complaint  Patient presents with   Routine Post Op    L TSA  DOS: 01/07/2023    History of Present Illness: Megan Salazar is a 63 y.o. female who presents following the above stated procedure.  She is approximately 1 year out from a left shoulder replacement.  She is doing very well.  She denies pain.  She feels as though she continues to get better.  She has returned to swimming.  She has no numbness or tingling.  She is also reporting that she is having some right shoulder pain, similar to the left, but not as severe.   Review of Systems: No fevers or chills No numbness or tingling No Chest Pain No shortness of breath   Objective: There were no vitals taken for this visit.  Physical Exam:  Alert and oriented.  No acute distress.  Left shoulder surgical incision has healed.  No surrounding erythema or drainage.  Active forward flexion limited to approximately 130 degrees.  45 degrees of external rotation at her side.  Sensation is intact throughout the left shoulder, including the axillary nerve distribution.  Fingers are warm and well-perfused.  IMAGING: I personally ordered and reviewed the following images:  X-rays  left shoulder were obtained in clinic today.  These are compared to available x-rays.  Shoulder arthroplasty remains in stable alignment.  No change in overall position.  No subsidence.  No lucency.  No evidence of proximal humeral migration.  Megan Salazar for glenoid remains visible, and unchanged position.  Impression: Stable left total shoulder arthroplasty without subsidence or acute injury   Megan LABOR Horde, MD 01/18/2024 9:27 AM
